# Patient Record
Sex: Female | Born: 1970 | Race: White | Hispanic: No | Marital: Married | Smoking: Never smoker
Health system: Southern US, Community
[De-identification: ages and names within clinical notes are randomized; demographics above are authoritative.]

## PROBLEM LIST (undated history)

## (undated) DIAGNOSIS — F319 Bipolar disorder, unspecified: Secondary | ICD-10-CM

## (undated) DIAGNOSIS — F99 Mental disorder, not otherwise specified: Secondary | ICD-10-CM

## (undated) DIAGNOSIS — I10 Essential (primary) hypertension: Secondary | ICD-10-CM

## (undated) DIAGNOSIS — G9340 Encephalopathy, unspecified: Secondary | ICD-10-CM

## (undated) DIAGNOSIS — F32A Depression, unspecified: Secondary | ICD-10-CM

## (undated) DIAGNOSIS — G935 Compression of brain: Secondary | ICD-10-CM

## (undated) DIAGNOSIS — F329 Major depressive disorder, single episode, unspecified: Secondary | ICD-10-CM

## (undated) DIAGNOSIS — F909 Attention-deficit hyperactivity disorder, unspecified type: Secondary | ICD-10-CM

## (undated) DIAGNOSIS — F410 Panic disorder [episodic paroxysmal anxiety] without agoraphobia: Secondary | ICD-10-CM

## (undated) DIAGNOSIS — F419 Anxiety disorder, unspecified: Secondary | ICD-10-CM

## (undated) DIAGNOSIS — K219 Gastro-esophageal reflux disease without esophagitis: Secondary | ICD-10-CM

## (undated) DIAGNOSIS — G43709 Chronic migraine without aura, not intractable, without status migrainosus: Secondary | ICD-10-CM

## (undated) DIAGNOSIS — E119 Type 2 diabetes mellitus without complications: Secondary | ICD-10-CM

## (undated) DIAGNOSIS — IMO0002 Reserved for concepts with insufficient information to code with codable children: Secondary | ICD-10-CM

## (undated) DIAGNOSIS — J45909 Unspecified asthma, uncomplicated: Secondary | ICD-10-CM

## (undated) DIAGNOSIS — J309 Allergic rhinitis, unspecified: Secondary | ICD-10-CM

## (undated) HISTORY — DX: Mental disorder, not otherwise specified: F99

## (undated) HISTORY — DX: Chronic migraine without aura, not intractable, without status migrainosus: G43.709

## (undated) HISTORY — DX: Depression, unspecified: F32.A

## (undated) HISTORY — DX: Compression of brain: G93.5

## (undated) HISTORY — DX: Major depressive disorder, single episode, unspecified: F32.9

## (undated) HISTORY — DX: Anxiety disorder, unspecified: F41.9

## (undated) HISTORY — DX: Panic disorder (episodic paroxysmal anxiety): F41.0

## (undated) HISTORY — DX: Allergic rhinitis, unspecified: J30.9

## (undated) HISTORY — PX: TUBAL LIGATION: SHX77

## (undated) HISTORY — PX: CHOLECYSTECTOMY: SHX55

## (undated) HISTORY — DX: Unspecified asthma, uncomplicated: J45.909

## (undated) HISTORY — DX: Reserved for concepts with insufficient information to code with codable children: IMO0002

## (undated) HISTORY — DX: Encephalopathy, unspecified: G93.40

## (undated) HISTORY — DX: Attention-deficit hyperactivity disorder, unspecified type: F90.9

## (undated) HISTORY — DX: Essential (primary) hypertension: I10

## (undated) HISTORY — DX: Gastro-esophageal reflux disease without esophagitis: K21.9

---

## 1997-02-24 ENCOUNTER — Ambulatory Visit (HOSPITAL_COMMUNITY): Admission: RE | Admit: 1997-02-24 | Discharge: 1997-02-24 | Payer: Self-pay | Admitting: Obstetrics and Gynecology

## 1997-03-09 ENCOUNTER — Inpatient Hospital Stay (HOSPITAL_COMMUNITY): Admission: AD | Admit: 1997-03-09 | Discharge: 1997-03-11 | Payer: Self-pay | Admitting: Obstetrics and Gynecology

## 1997-04-11 ENCOUNTER — Inpatient Hospital Stay (HOSPITAL_COMMUNITY): Admission: AD | Admit: 1997-04-11 | Discharge: 1997-04-11 | Payer: Self-pay | Admitting: Obstetrics and Gynecology

## 1997-04-19 ENCOUNTER — Inpatient Hospital Stay (HOSPITAL_COMMUNITY): Admission: AD | Admit: 1997-04-19 | Discharge: 1997-04-19 | Payer: Self-pay | Admitting: Obstetrics & Gynecology

## 1997-04-27 ENCOUNTER — Inpatient Hospital Stay (HOSPITAL_COMMUNITY): Admission: AD | Admit: 1997-04-27 | Discharge: 1997-04-29 | Payer: Self-pay | Admitting: Obstetrics & Gynecology

## 1997-06-01 ENCOUNTER — Other Ambulatory Visit: Admission: RE | Admit: 1997-06-01 | Discharge: 1997-06-01 | Payer: Self-pay | Admitting: Obstetrics & Gynecology

## 1997-10-23 ENCOUNTER — Ambulatory Visit (HOSPITAL_COMMUNITY): Admission: RE | Admit: 1997-10-23 | Discharge: 1997-10-23 | Payer: Self-pay | Admitting: Family Medicine

## 1998-08-06 ENCOUNTER — Other Ambulatory Visit: Admission: RE | Admit: 1998-08-06 | Discharge: 1998-08-06 | Payer: Self-pay | Admitting: Obstetrics & Gynecology

## 1999-02-14 ENCOUNTER — Emergency Department (HOSPITAL_COMMUNITY): Admission: EM | Admit: 1999-02-14 | Discharge: 1999-02-14 | Payer: Self-pay | Admitting: Internal Medicine

## 1999-02-14 ENCOUNTER — Encounter: Payer: Self-pay | Admitting: Internal Medicine

## 1999-10-18 ENCOUNTER — Other Ambulatory Visit: Admission: RE | Admit: 1999-10-18 | Discharge: 1999-10-18 | Payer: Self-pay | Admitting: Obstetrics & Gynecology

## 2000-03-14 ENCOUNTER — Ambulatory Visit (HOSPITAL_COMMUNITY): Admission: RE | Admit: 2000-03-14 | Discharge: 2000-03-14 | Payer: Self-pay | Admitting: Family Medicine

## 2000-05-19 ENCOUNTER — Observation Stay (HOSPITAL_COMMUNITY): Admission: AD | Admit: 2000-05-19 | Discharge: 2000-05-20 | Payer: Self-pay | Admitting: Obstetrics & Gynecology

## 2000-06-02 ENCOUNTER — Inpatient Hospital Stay (HOSPITAL_COMMUNITY): Admission: AD | Admit: 2000-06-02 | Discharge: 2000-06-05 | Payer: Self-pay | Admitting: Obstetrics and Gynecology

## 2002-12-15 ENCOUNTER — Other Ambulatory Visit: Admission: RE | Admit: 2002-12-15 | Discharge: 2002-12-15 | Payer: Self-pay | Admitting: Obstetrics & Gynecology

## 2003-06-29 ENCOUNTER — Ambulatory Visit (HOSPITAL_COMMUNITY): Admission: RE | Admit: 2003-06-29 | Discharge: 2003-06-29 | Payer: Self-pay | Admitting: Obstetrics & Gynecology

## 2003-06-30 ENCOUNTER — Ambulatory Visit (HOSPITAL_COMMUNITY): Admission: RE | Admit: 2003-06-30 | Discharge: 2003-06-30 | Payer: Self-pay | Admitting: Internal Medicine

## 2004-01-08 ENCOUNTER — Ambulatory Visit: Payer: Self-pay | Admitting: Family Medicine

## 2004-02-18 ENCOUNTER — Emergency Department (HOSPITAL_COMMUNITY): Admission: EM | Admit: 2004-02-18 | Discharge: 2004-02-18 | Payer: Self-pay | Admitting: Emergency Medicine

## 2004-03-24 ENCOUNTER — Emergency Department (HOSPITAL_COMMUNITY): Admission: EM | Admit: 2004-03-24 | Discharge: 2004-03-24 | Payer: Self-pay | Admitting: Emergency Medicine

## 2004-03-25 ENCOUNTER — Ambulatory Visit: Payer: Self-pay | Admitting: Family Medicine

## 2004-04-19 ENCOUNTER — Ambulatory Visit: Payer: Self-pay | Admitting: Family Medicine

## 2004-04-25 ENCOUNTER — Ambulatory Visit: Payer: Self-pay | Admitting: Family Medicine

## 2004-06-21 ENCOUNTER — Ambulatory Visit: Payer: Self-pay | Admitting: Family Medicine

## 2004-07-13 ENCOUNTER — Ambulatory Visit: Payer: Self-pay | Admitting: Family Medicine

## 2004-08-18 ENCOUNTER — Ambulatory Visit: Payer: Self-pay | Admitting: Family Medicine

## 2004-09-01 ENCOUNTER — Ambulatory Visit: Payer: Self-pay | Admitting: Family Medicine

## 2005-01-04 ENCOUNTER — Ambulatory Visit: Payer: Self-pay | Admitting: Family Medicine

## 2005-02-20 ENCOUNTER — Ambulatory Visit: Payer: Self-pay | Admitting: Family Medicine

## 2005-05-24 ENCOUNTER — Encounter: Payer: Self-pay | Admitting: Family Medicine

## 2011-07-31 DIAGNOSIS — L658 Other specified nonscarring hair loss: Secondary | ICD-10-CM

## 2011-07-31 DIAGNOSIS — L219 Seborrheic dermatitis, unspecified: Secondary | ICD-10-CM | POA: Insufficient documentation

## 2011-07-31 HISTORY — DX: Seborrheic dermatitis, unspecified: L21.9

## 2011-07-31 HISTORY — DX: Other specified nonscarring hair loss: L65.8

## 2011-12-01 ENCOUNTER — Institutional Professional Consult (permissible substitution): Payer: Self-pay | Admitting: Pulmonary Disease

## 2011-12-07 ENCOUNTER — Encounter: Payer: Self-pay | Admitting: Pulmonary Disease

## 2011-12-07 ENCOUNTER — Ambulatory Visit (INDEPENDENT_AMBULATORY_CARE_PROVIDER_SITE_OTHER): Payer: 59 | Admitting: Pulmonary Disease

## 2011-12-07 VITALS — BP 128/78 | HR 96 | Temp 98.0°F | Ht 66.0 in | Wt 175.4 lb

## 2011-12-07 DIAGNOSIS — R0683 Snoring: Secondary | ICD-10-CM

## 2011-12-07 DIAGNOSIS — G4710 Hypersomnia, unspecified: Secondary | ICD-10-CM

## 2011-12-07 HISTORY — DX: Snoring: R06.83

## 2011-12-07 NOTE — Addendum Note (Signed)
Addended by: Tommie Sams on: 12/07/2011 11:37 AM   Modules accepted: Orders

## 2011-12-07 NOTE — Patient Instructions (Signed)
Will arrange for sleep study Will call to arrange for follow up after sleep study reviewed 

## 2011-12-07 NOTE — Progress Notes (Deleted)
  Subjective:    Patient ID: Providence Lanius, female    DOB: 1970/07/30, 41 y.o.   MRN: [id number]  HPI    Review of Systems  Constitutional: Positive for appetite change and unexpected weight change.  HENT: Positive for ear pain, congestion, sneezing and trouble swallowing. Negative for sore throat and dental problem.   Respiratory: Positive for cough and shortness of breath.   Cardiovascular: Positive for chest pain, palpitations and leg swelling.  Gastrointestinal: Negative for abdominal pain.  Musculoskeletal: Negative for joint swelling.  Skin: Negative for rash.  Neurological: Positive for headaches.  Psychiatric/Behavioral: Positive for dysphoric mood. The patient is nervous/anxious.        Objective:   Physical Exam        Assessment & Plan:

## 2011-12-07 NOTE — Assessment & Plan Note (Addendum)
She reports snoring, sleep disruption, and daytime sleepiness.  She has history of asthma, hypertension, chronic migraines, Chiari 1 malformation, and depression.  I am concerned she could have sleep apnea.  I have explained how sleep apnea can affect the patient's health.  Driving precautions and importance of weight loss were discussed.  Treatment options for sleep apnea were reviewed.  To further assess will arrange for in lab sleep study.

## 2011-12-07 NOTE — Progress Notes (Signed)
Chief Complaint  Patient presents with  . Advice Only    trouble staying asleep, snoring is loud/worse, feeling very tired during the day. does not have trouble falling asleep. wakes up about 5-7 times per night. some nights she is up for several hours before she can go back to sleep   CC: Meredith Cherry  History of Present Illness: Meredith Cherry is a 41 y.o. female for evaluation of sleep troubles.  She is followed by Dr. Neale Burly for headaches.  There was concern about sleep problems contributing to her headaches.  As a result she was referred to pulmonary/sleep medicine.  She snores, and wakes up feeling like she can't breath.  Her snoring is getting louder, and she is waking up more frequently.  She goes to bed at 12 midnight.  She falls asleep quickly.  She wakes up after about an hour.  She can usually go back to sleep, but sometimes can take up to a few hours to fall back to sleep.  She gets out of bed at 640 am to get her kids ready for school.  She will then go back to sleep for another hour.  She feels okay in the morning, but still gets headaches.  She will feel sleepy as the day goes on, and can fall asleep if sitting quiet.  She is not using anything to help her sleep or stay awake.  She does talk in her sleep.  She wears a mouth guard for bruxism.  She gets frequent allergy and sinus problems.  Her Epworth score is 4 out of 24.  The patient denies sleep walking, or nightmares.  There is no history of restless legs.  The patient denies sleep hallucinations, sleep paralysis, or cataplexy.  Tests:  Past Medical History  Diagnosis Date  . Chronic migraine   . ADHD (attention deficit hyperactivity disorder)   . Anxiety   . Asthma   . Depression   . GERD (gastroesophageal reflux disease)   . Hypertension   . Psychiatric disorder   . Allergic rhinitis   . Panic attack   . Chiari malformation type I     Past Surgical History  Procedure Date  . Cholecystectomy   .  Tubal ligation     Current Outpatient Prescriptions on File Prior to Visit  Medication Sig Dispense Refill  . albuterol (PROVENTIL) (2.5 MG/3ML) 0.083% nebulizer solution Take 2.5 mg by nebulization every 6 (six) hours as needed.      Marland Kitchen amLODipine (NORVASC) 10 MG tablet Take 10 mg by mouth daily.      Marland Kitchen buPROPion (WELLBUTRIN SR) 150 MG 12 hr tablet Take 150 mg by mouth 2 (two) times daily.      . cloNIDine (CATAPRES) 0.1 MG tablet Take 0.1 mg by mouth 2 (two) times daily.      Marland Kitchen diltiazem (CARDIZEM) 120 MG tablet Take 120 mg by mouth 2 (two) times daily.      . hydrochlorothiazide (HYDRODIURIL) 25 MG tablet Take 25 mg by mouth daily.      . mometasone-formoterol (DULERA) 100-5 MCG/ACT AERO Inhale 2 puffs into the lungs as needed.      . ranitidine (ZANTAC) 300 MG tablet Take 300 mg by mouth at bedtime.      . topiramate (TOPAMAX) 100 MG tablet Take 100 mg by mouth daily.        Allergies  Allergen Reactions  . Bactrim (Sulfamethoxazole W-Trimethoprim)   . Penicillins   . Sulfa Antibiotics  Family History  Problem Relation Age of Onset  . Stroke Mother     x 5  . Hypertension Mother   . Heart disease Mother   . Diabetes Mother   . Diabetes Father   . Heart disease Maternal Grandmother   . Breast cancer Paternal Grandmother   . Asthma Brother     History  Substance Use Topics  . Smoking status: Never Smoker   . Smokeless tobacco: Not on file  . Alcohol Use: No    Review of Systems  Constitutional: Positive for appetite change and unexpected weight change.  HENT: Positive for ear pain, congestion, sneezing and trouble swallowing. Negative for sore throat and dental problem.   Respiratory: Positive for cough and shortness of breath.   Cardiovascular: Positive for chest pain, palpitations and leg swelling.  Gastrointestinal: Negative for abdominal pain.  Musculoskeletal: Negative for joint swelling.  Skin: Negative for rash.  Neurological: Positive for headaches.    Psychiatric/Behavioral: Positive for dysphoric mood. The patient is nervous/anxious.    Physical Exam: Filed Vitals:   12/07/11 0944  BP: 128/78  Pulse: 96  Temp: 98 F (36.7 C)  TempSrc: Oral  Height: 5\' 6"  (1.676 m)  Weight: 175 lb 6.4 oz (79.561 kg)  SpO2: 99%  ,  Current Encounter SPO2  12/07/11 0944 99%    Wt Readings from Last 3 Encounters:  12/07/11 175 lb 6.4 oz (79.561 kg)    Body mass index is 28.31 kg/(m^2).   General - No distress ENT - No sinus tenderness, narrow nasal angles, MP3, 2+ tonsil, enlarged tongue no oral exudate, no LAN, no thyromegaly, TM clear, pupils equal/reactive Cardiac - s1s2 regular, no murmur, pulses symmetric Chest - No wheeze/rales/dullness, good air entry, normal respiratory excursion Back - No focal tenderness Abd - Soft, non-tender, no organomegaly, + bowel sounds Ext - No edema Neuro - Normal strength, cranial nerves intact Skin - No rashes Psych - Normal mood, and behavior.   Assessment/Plan:  Coralyn Helling, MD [facility]/Sleep Pager:  [phone] 12/07/2011, 9:59 AM

## 2012-01-12 ENCOUNTER — Ambulatory Visit (HOSPITAL_BASED_OUTPATIENT_CLINIC_OR_DEPARTMENT_OTHER): Payer: 59 | Attending: Pulmonary Disease | Admitting: Sleep Medicine

## 2012-01-12 VITALS — Ht 66.0 in | Wt 169.0 lb

## 2012-01-12 DIAGNOSIS — G4710 Hypersomnia, unspecified: Secondary | ICD-10-CM

## 2012-02-05 ENCOUNTER — Telehealth: Payer: Self-pay | Admitting: Pulmonary Disease

## 2012-02-05 ENCOUNTER — Encounter: Payer: Self-pay | Admitting: Pulmonary Disease

## 2012-02-05 ENCOUNTER — Ambulatory Visit (INDEPENDENT_AMBULATORY_CARE_PROVIDER_SITE_OTHER): Payer: 59 | Admitting: Pulmonary Disease

## 2012-02-05 VITALS — BP 160/102 | HR 125 | Temp 97.7°F | Ht 66.0 in | Wt 177.0 lb

## 2012-02-05 DIAGNOSIS — G4710 Hypersomnia, unspecified: Secondary | ICD-10-CM

## 2012-02-05 DIAGNOSIS — R0609 Other forms of dyspnea: Secondary | ICD-10-CM

## 2012-02-05 DIAGNOSIS — R0683 Snoring: Secondary | ICD-10-CM

## 2012-02-05 NOTE — Telephone Encounter (Signed)
I spoke with pt. Scheduled to see VS 02/06/12 at 11:3o to discuss results

## 2012-02-05 NOTE — Patient Instructions (Signed)
Call if you find a mouth piece you like to help with your snoring Follow up as needed

## 2012-02-05 NOTE — Telephone Encounter (Signed)
PSG 01/12/12 >> AHI 0, SpO2 88%, PLMI 0.  Decreased R, no S.  Will have my nurse schedule ROV to review results.

## 2012-02-05 NOTE — Progress Notes (Signed)
Chief Complaint  Patient presents with  . Results    pt here to discuss sleep study results.     History of Present Illness: Meredith Cherry is a 42 y.o. female with sleep troubles, and snoring.  She is here to review her sleep study.  She is very concerned about how her snoring is impacting her daughters sleep.  She is under a lot of stress related to her daughters mental health problems.  TESTS: PSG 01/12/12 >> AHI 0, SpO2 88%, PLMI 0. Decreased R, no S.  Past Medical History  Diagnosis Date  . Chronic migraine   . ADHD (attention deficit hyperactivity disorder)   . Anxiety   . Asthma   . Depression   . GERD (gastroesophageal reflux disease)   . Hypertension   . Psychiatric disorder   . Allergic rhinitis   . Panic attack   . Chiari malformation type I     Past Surgical History  Procedure Date  . Cholecystectomy   . Tubal ligation     Outpatient Encounter Prescriptions as of 02/05/2012  Medication Sig Dispense Refill  . albuterol (PROVENTIL) (2.5 MG/3ML) 0.083% nebulizer solution Take 2.5 mg by nebulization every 6 (six) hours as needed.      Marland Kitchen amLODipine (NORVASC) 10 MG tablet Take 10 mg by mouth daily.      Marland Kitchen buPROPion (WELLBUTRIN XL) 300 MG 24 hr tablet Take 300 mg by mouth daily.      . cloNIDine (CATAPRES) 0.1 MG tablet Take 0.1 mg by mouth 2 (two) times daily.      Marland Kitchen diltiazem (CARDIZEM) 120 MG tablet Take 120 mg by mouth 2 (two) times daily.      . hydrochlorothiazide (HYDRODIURIL) 25 MG tablet Take 25 mg by mouth daily.      . mometasone-formoterol (DULERA) 100-5 MCG/ACT AERO Inhale 2 puffs into the lungs as needed.      . topiramate (TOPAMAX) 100 MG tablet Take 100 mg by mouth daily.      . [DISCONTINUED] buPROPion (WELLBUTRIN SR) 150 MG 12 hr tablet Take 150 mg by mouth 2 (two) times daily.      . [DISCONTINUED] busPIRone (BUSPAR) 10 MG tablet Take 10 mg by mouth 2 (two) times daily.      . [DISCONTINUED] ranitidine (ZANTAC) 300 MG tablet Take 300 mg by mouth  at bedtime.        Allergies  Allergen Reactions  . Bactrim (Sulfamethoxazole W-Trimethoprim)   . Penicillins   . Sulfa Antibiotics     Physical Exam:  Filed Vitals:   02/05/12 1511 02/05/12 1512  BP:  160/102  Pulse:  125  Temp: 97.7 F (36.5 C)   TempSrc: Oral   Height: 5\' 6"  (1.676 m)   Weight: 177 lb (80.287 kg)   SpO2:  100%     Current Encounter SPO2  02/05/12 1512 100%  12/07/11 0944 99%     Body mass index is 28.57 kg/(m^2).   Wt Readings from Last 2 Encounters:  02/05/12 177 lb (80.287 kg)  01/12/12 169 lb (76.658 kg)     General - No distress ENT - No sinus tenderness, no oral exudate, no LAN Cardiac - s1s2 regular, no murmur Chest - No wheeze/rales/dullness Back - No focal tenderness Abd - Soft, non-tender Ext - No edema Neuro - Normal strength Skin - No rashes Psych - normal mood, and behavior   Assessment/Plan:  Coralyn Helling, MD [facility]/Sleep Pager:  [phone] 02/05/2012, 3:26 PM

## 2012-02-05 NOTE — Assessment & Plan Note (Signed)
He sleep study did not show evidence for sleep apnea.  I reviewed several options to help with her snoring, including oral appliance and surgical intervention.  Explained that insurance coverage for any intervention will be difficult to arrange since she does not have sleep apnea.  She will look into different options, and call our office if further assistance is needed.

## 2012-02-05 NOTE — Telephone Encounter (Signed)
Pt had sleep study 01/12/12. Please advise VS thanks

## 2012-02-06 ENCOUNTER — Ambulatory Visit: Payer: 59 | Admitting: Pulmonary Disease

## 2012-02-06 NOTE — Procedures (Signed)
Meredith Cherry, KYSAR NO.:  [id number]  MEDICAL RECORD NO.:  [id number]          PATIENT TYPE:  OUT  LOCATION:  SLEEP CENTER                 FACILITY:  [facility]  PHYSICIAN:  Coralyn Helling, MD        DATE OF BIRTH:  07-09-1970  DATE OF STUDY:  01/12/2012                           NOCTURNAL POLYSOMNOGRAM  REFERRING PHYSICIAN:  Coralyn Helling, MD  INDICATION:  [name] is a 42 year old female who has a history of hypertension, chronic migraine, asthma, depression, and Chiari type 1 malformation.  She also reports snoring, sleep disruption, and daytime sleepiness.  She is referred to the sleep lab for evaluation of hypersomnia with obstructive sleep apnea.  Height is 5 feet 6 inches, weight is 169 pounds, BMI is 27.3, neck size is 14 inches.  MEDICATIONS:  Wellbutrin, BuSpar, clonidine, amlodipine, diltiazem, hydrochlorothiazide, and Topamax.  EPWORTH SCORE:  9.  SLEEP ARCHITECTURE:  Total recording time was 389 minutes.  Total sleep time was 167 minutes.  Sleep efficiency was 0.3%, sleep latency was 91 minutes, REM latency was 288 minutes.  The patient was observed in all stages of sleep and slept exclusively in the non-supine position.  RESPIRATORY DATA:  The average respiratory rate was 20.  Mild snoring was noted by the technician.  The overall apnea/hypopnea index was 0.  CARDIAC DATA:  The average heart rate was 94 and the rhythm strip showed sinus rhythm.  OXYGEN DATA:  The baseline oxygenation was 99%.  The oxygen saturation nadir was 88%.  The study was conducted without the use of supplemental oxygen.  MOVEMENT/PARASOMNIA:  The periodic limb movement index was 0.  The patient had 1 restroom trip.  The patient was noted to be very upset on arrival to the sleep lab apparently due to family difficulties.  She had also complained of indigestion.  IMPRESSION:  This study did not show evidence for sleep-disordered breathing or periodic limb movements.  Of  note is that she had reduced total sleep time and did not sleep in the supine position.  Therefore, this may underestimate the presence of sleep-disordered breathing.  Clinical correlation would be necessary to determine if the patient would require a repeat sleep study with the use of a sleep aid. Additional evaluation of her complaints of hypersomnia could include a multiple sleep latency test and/or a maintenance of wakefulness test. Clinical correlation would be necessary to determine appropriate testing. In addition, she may benefit from adjustments in her psychotropic medications as these may be contributing to her symptoms of daytime sleepiness as well.     Coralyn Helling, MD Diplomat, American Board of Sleep Medicine    VS/MEDQ  D:  02/05/2012 12:29:28  T:  02/06/2012 01:27:45  Job:  [id number]

## 2012-09-03 DIAGNOSIS — J40 Bronchitis, not specified as acute or chronic: Secondary | ICD-10-CM

## 2012-09-03 DIAGNOSIS — I499 Cardiac arrhythmia, unspecified: Secondary | ICD-10-CM | POA: Insufficient documentation

## 2012-09-03 DIAGNOSIS — G43009 Migraine without aura, not intractable, without status migrainosus: Secondary | ICD-10-CM

## 2012-09-03 DIAGNOSIS — M4716 Other spondylosis with myelopathy, lumbar region: Secondary | ICD-10-CM

## 2012-09-03 HISTORY — DX: Cardiac arrhythmia, unspecified: I49.9

## 2012-09-03 HISTORY — DX: Other spondylosis with myelopathy, lumbar region: M47.16

## 2012-09-03 HISTORY — DX: Bronchitis, not specified as acute or chronic: J40

## 2012-09-03 HISTORY — DX: Migraine without aura, not intractable, without status migrainosus: G43.009

## 2012-09-16 DIAGNOSIS — R519 Headache, unspecified: Secondary | ICD-10-CM

## 2012-09-16 HISTORY — DX: Headache, unspecified: R51.9

## 2012-11-22 DIAGNOSIS — Z8619 Personal history of other infectious and parasitic diseases: Secondary | ICD-10-CM | POA: Insufficient documentation

## 2012-11-22 DIAGNOSIS — J45909 Unspecified asthma, uncomplicated: Secondary | ICD-10-CM | POA: Insufficient documentation

## 2012-11-22 DIAGNOSIS — I10 Essential (primary) hypertension: Secondary | ICD-10-CM | POA: Insufficient documentation

## 2012-11-22 DIAGNOSIS — Z8614 Personal history of Methicillin resistant Staphylococcus aureus infection: Secondary | ICD-10-CM

## 2012-11-22 DIAGNOSIS — K219 Gastro-esophageal reflux disease without esophagitis: Secondary | ICD-10-CM

## 2012-11-22 DIAGNOSIS — R55 Syncope and collapse: Secondary | ICD-10-CM

## 2012-11-22 HISTORY — DX: Essential (primary) hypertension: I10

## 2012-11-22 HISTORY — DX: Personal history of Methicillin resistant Staphylococcus aureus infection: Z86.14

## 2012-11-22 HISTORY — DX: Syncope and collapse: R55

## 2012-11-22 HISTORY — DX: Personal history of other infectious and parasitic diseases: Z86.19

## 2012-11-22 HISTORY — DX: Gastro-esophageal reflux disease without esophagitis: K21.9

## 2013-04-12 ENCOUNTER — Inpatient Hospital Stay: Payer: Self-pay | Admitting: Psychiatry

## 2013-12-19 DIAGNOSIS — R002 Palpitations: Secondary | ICD-10-CM | POA: Insufficient documentation

## 2014-01-26 DIAGNOSIS — J069 Acute upper respiratory infection, unspecified: Secondary | ICD-10-CM | POA: Diagnosis not present

## 2014-01-28 DIAGNOSIS — R079 Chest pain, unspecified: Secondary | ICD-10-CM | POA: Diagnosis not present

## 2014-01-28 DIAGNOSIS — E119 Type 2 diabetes mellitus without complications: Secondary | ICD-10-CM | POA: Diagnosis not present

## 2014-01-28 DIAGNOSIS — R1110 Vomiting, unspecified: Secondary | ICD-10-CM | POA: Diagnosis not present

## 2014-01-28 DIAGNOSIS — J069 Acute upper respiratory infection, unspecified: Secondary | ICD-10-CM | POA: Diagnosis not present

## 2014-01-28 DIAGNOSIS — I10 Essential (primary) hypertension: Secondary | ICD-10-CM | POA: Diagnosis not present

## 2014-01-28 DIAGNOSIS — R000 Tachycardia, unspecified: Secondary | ICD-10-CM | POA: Diagnosis not present

## 2014-01-28 DIAGNOSIS — J45909 Unspecified asthma, uncomplicated: Secondary | ICD-10-CM | POA: Diagnosis not present

## 2014-01-28 DIAGNOSIS — Z7982 Long term (current) use of aspirin: Secondary | ICD-10-CM | POA: Diagnosis not present

## 2014-02-02 DIAGNOSIS — R1033 Periumbilical pain: Secondary | ICD-10-CM | POA: Diagnosis not present

## 2014-02-02 DIAGNOSIS — R112 Nausea with vomiting, unspecified: Secondary | ICD-10-CM | POA: Diagnosis not present

## 2014-02-02 DIAGNOSIS — I10 Essential (primary) hypertension: Secondary | ICD-10-CM | POA: Diagnosis not present

## 2014-02-02 DIAGNOSIS — F419 Anxiety disorder, unspecified: Secondary | ICD-10-CM | POA: Diagnosis not present

## 2014-02-02 DIAGNOSIS — E119 Type 2 diabetes mellitus without complications: Secondary | ICD-10-CM | POA: Diagnosis not present

## 2014-02-02 DIAGNOSIS — R339 Retention of urine, unspecified: Secondary | ICD-10-CM | POA: Diagnosis not present

## 2014-02-02 DIAGNOSIS — J0100 Acute maxillary sinusitis, unspecified: Secondary | ICD-10-CM | POA: Diagnosis not present

## 2014-02-02 DIAGNOSIS — J45909 Unspecified asthma, uncomplicated: Secondary | ICD-10-CM | POA: Diagnosis not present

## 2014-02-02 DIAGNOSIS — R509 Fever, unspecified: Secondary | ICD-10-CM | POA: Diagnosis not present

## 2014-02-02 DIAGNOSIS — R109 Unspecified abdominal pain: Secondary | ICD-10-CM | POA: Diagnosis not present

## 2014-02-02 DIAGNOSIS — F329 Major depressive disorder, single episode, unspecified: Secondary | ICD-10-CM | POA: Diagnosis not present

## 2014-02-02 DIAGNOSIS — Z7982 Long term (current) use of aspirin: Secondary | ICD-10-CM | POA: Diagnosis not present

## 2014-02-03 DIAGNOSIS — R112 Nausea with vomiting, unspecified: Secondary | ICD-10-CM | POA: Diagnosis not present

## 2014-02-03 DIAGNOSIS — E119 Type 2 diabetes mellitus without complications: Secondary | ICD-10-CM | POA: Diagnosis not present

## 2014-02-03 DIAGNOSIS — F419 Anxiety disorder, unspecified: Secondary | ICD-10-CM | POA: Diagnosis not present

## 2014-02-03 DIAGNOSIS — R509 Fever, unspecified: Secondary | ICD-10-CM | POA: Diagnosis not present

## 2014-02-03 DIAGNOSIS — I10 Essential (primary) hypertension: Secondary | ICD-10-CM | POA: Diagnosis not present

## 2014-02-03 DIAGNOSIS — R339 Retention of urine, unspecified: Secondary | ICD-10-CM | POA: Diagnosis not present

## 2014-02-03 DIAGNOSIS — R262 Difficulty in walking, not elsewhere classified: Secondary | ICD-10-CM | POA: Diagnosis not present

## 2014-02-03 DIAGNOSIS — Z7982 Long term (current) use of aspirin: Secondary | ICD-10-CM | POA: Diagnosis not present

## 2014-02-03 DIAGNOSIS — F329 Major depressive disorder, single episode, unspecified: Secondary | ICD-10-CM | POA: Diagnosis not present

## 2014-02-03 DIAGNOSIS — M25661 Stiffness of right knee, not elsewhere classified: Secondary | ICD-10-CM | POA: Diagnosis not present

## 2014-02-03 DIAGNOSIS — M25561 Pain in right knee: Secondary | ICD-10-CM | POA: Diagnosis not present

## 2014-02-03 DIAGNOSIS — J45909 Unspecified asthma, uncomplicated: Secondary | ICD-10-CM | POA: Diagnosis not present

## 2014-02-03 DIAGNOSIS — R109 Unspecified abdominal pain: Secondary | ICD-10-CM | POA: Diagnosis not present

## 2014-02-05 DIAGNOSIS — N319 Neuromuscular dysfunction of bladder, unspecified: Secondary | ICD-10-CM | POA: Diagnosis not present

## 2014-02-05 DIAGNOSIS — R339 Retention of urine, unspecified: Secondary | ICD-10-CM | POA: Diagnosis not present

## 2014-02-05 DIAGNOSIS — R3912 Poor urinary stream: Secondary | ICD-10-CM | POA: Diagnosis not present

## 2014-02-05 DIAGNOSIS — N3090 Cystitis, unspecified without hematuria: Secondary | ICD-10-CM | POA: Diagnosis not present

## 2014-02-05 DIAGNOSIS — N312 Flaccid neuropathic bladder, not elsewhere classified: Secondary | ICD-10-CM | POA: Diagnosis not present

## 2014-02-12 DIAGNOSIS — R339 Retention of urine, unspecified: Secondary | ICD-10-CM | POA: Diagnosis not present

## 2014-02-20 DIAGNOSIS — I10 Essential (primary) hypertension: Secondary | ICD-10-CM | POA: Diagnosis not present

## 2014-02-21 DIAGNOSIS — E119 Type 2 diabetes mellitus without complications: Secondary | ICD-10-CM | POA: Diagnosis not present

## 2014-02-21 DIAGNOSIS — Z7982 Long term (current) use of aspirin: Secondary | ICD-10-CM | POA: Diagnosis not present

## 2014-02-21 DIAGNOSIS — I4891 Unspecified atrial fibrillation: Secondary | ICD-10-CM | POA: Diagnosis not present

## 2014-02-21 DIAGNOSIS — F419 Anxiety disorder, unspecified: Secondary | ICD-10-CM | POA: Diagnosis not present

## 2014-02-21 DIAGNOSIS — I10 Essential (primary) hypertension: Secondary | ICD-10-CM | POA: Diagnosis not present

## 2014-02-24 DIAGNOSIS — A09 Infectious gastroenteritis and colitis, unspecified: Secondary | ICD-10-CM | POA: Diagnosis not present

## 2014-03-02 DIAGNOSIS — F331 Major depressive disorder, recurrent, moderate: Secondary | ICD-10-CM | POA: Diagnosis not present

## 2014-03-11 DIAGNOSIS — R339 Retention of urine, unspecified: Secondary | ICD-10-CM | POA: Diagnosis not present

## 2014-03-12 DIAGNOSIS — J209 Acute bronchitis, unspecified: Secondary | ICD-10-CM | POA: Diagnosis not present

## 2014-03-23 DIAGNOSIS — J4521 Mild intermittent asthma with (acute) exacerbation: Secondary | ICD-10-CM | POA: Diagnosis not present

## 2014-03-23 DIAGNOSIS — I10 Essential (primary) hypertension: Secondary | ICD-10-CM | POA: Diagnosis not present

## 2014-03-23 DIAGNOSIS — R197 Diarrhea, unspecified: Secondary | ICD-10-CM | POA: Diagnosis not present

## 2014-03-23 DIAGNOSIS — E119 Type 2 diabetes mellitus without complications: Secondary | ICD-10-CM | POA: Diagnosis not present

## 2014-03-23 DIAGNOSIS — K2970 Gastritis, unspecified, without bleeding: Secondary | ICD-10-CM | POA: Diagnosis not present

## 2014-03-23 DIAGNOSIS — R079 Chest pain, unspecified: Secondary | ICD-10-CM | POA: Diagnosis not present

## 2014-03-23 DIAGNOSIS — Z7982 Long term (current) use of aspirin: Secondary | ICD-10-CM | POA: Diagnosis not present

## 2014-03-23 DIAGNOSIS — R1110 Vomiting, unspecified: Secondary | ICD-10-CM | POA: Diagnosis not present

## 2014-03-23 DIAGNOSIS — K529 Noninfective gastroenteritis and colitis, unspecified: Secondary | ICD-10-CM | POA: Diagnosis not present

## 2014-03-23 DIAGNOSIS — N838 Other noninflammatory disorders of ovary, fallopian tube and broad ligament: Secondary | ICD-10-CM | POA: Diagnosis not present

## 2014-03-23 DIAGNOSIS — J209 Acute bronchitis, unspecified: Secondary | ICD-10-CM | POA: Diagnosis not present

## 2014-03-23 DIAGNOSIS — E860 Dehydration: Secondary | ICD-10-CM | POA: Diagnosis not present

## 2014-03-23 DIAGNOSIS — R1011 Right upper quadrant pain: Secondary | ICD-10-CM | POA: Diagnosis not present

## 2014-03-23 DIAGNOSIS — J45909 Unspecified asthma, uncomplicated: Secondary | ICD-10-CM | POA: Diagnosis not present

## 2014-03-23 DIAGNOSIS — R112 Nausea with vomiting, unspecified: Secondary | ICD-10-CM | POA: Diagnosis not present

## 2014-03-23 DIAGNOSIS — K5730 Diverticulosis of large intestine without perforation or abscess without bleeding: Secondary | ICD-10-CM | POA: Diagnosis not present

## 2014-03-23 DIAGNOSIS — I4891 Unspecified atrial fibrillation: Secondary | ICD-10-CM | POA: Diagnosis not present

## 2014-03-27 DIAGNOSIS — R079 Chest pain, unspecified: Secondary | ICD-10-CM | POA: Diagnosis not present

## 2014-03-27 DIAGNOSIS — Z7982 Long term (current) use of aspirin: Secondary | ICD-10-CM | POA: Diagnosis not present

## 2014-03-27 DIAGNOSIS — I10 Essential (primary) hypertension: Secondary | ICD-10-CM | POA: Diagnosis not present

## 2014-03-27 DIAGNOSIS — J45909 Unspecified asthma, uncomplicated: Secondary | ICD-10-CM | POA: Diagnosis not present

## 2014-03-27 DIAGNOSIS — E119 Type 2 diabetes mellitus without complications: Secondary | ICD-10-CM | POA: Diagnosis not present

## 2014-03-27 DIAGNOSIS — R112 Nausea with vomiting, unspecified: Secondary | ICD-10-CM | POA: Diagnosis not present

## 2014-03-27 DIAGNOSIS — J90 Pleural effusion, not elsewhere classified: Secondary | ICD-10-CM | POA: Diagnosis not present

## 2014-03-27 DIAGNOSIS — I4891 Unspecified atrial fibrillation: Secondary | ICD-10-CM | POA: Diagnosis not present

## 2014-03-27 DIAGNOSIS — R55 Syncope and collapse: Secondary | ICD-10-CM | POA: Diagnosis not present

## 2014-04-03 DIAGNOSIS — Z1231 Encounter for screening mammogram for malignant neoplasm of breast: Secondary | ICD-10-CM | POA: Diagnosis not present

## 2014-04-16 DIAGNOSIS — R339 Retention of urine, unspecified: Secondary | ICD-10-CM | POA: Diagnosis not present
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
## 2014-05-20 DIAGNOSIS — F321 Major depressive disorder, single episode, moderate: Secondary | ICD-10-CM | POA: Diagnosis not present

## 2014-05-21 DIAGNOSIS — R0789 Other chest pain: Secondary | ICD-10-CM | POA: Diagnosis not present

## 2014-05-21 DIAGNOSIS — R0602 Shortness of breath: Secondary | ICD-10-CM | POA: Diagnosis not present

## 2014-05-21 DIAGNOSIS — J309 Allergic rhinitis, unspecified: Secondary | ICD-10-CM | POA: Diagnosis not present

## 2014-05-21 DIAGNOSIS — R079 Chest pain, unspecified: Secondary | ICD-10-CM | POA: Diagnosis not present

## 2014-05-21 DIAGNOSIS — R112 Nausea with vomiting, unspecified: Secondary | ICD-10-CM | POA: Diagnosis not present

## 2014-06-15 DIAGNOSIS — R339 Retention of urine, unspecified: Secondary | ICD-10-CM | POA: Diagnosis not present

## 2014-06-17 DIAGNOSIS — F320 Major depressive disorder, single episode, mild: Secondary | ICD-10-CM | POA: Diagnosis not present

## 2014-07-22 DIAGNOSIS — F321 Major depressive disorder, single episode, moderate: Secondary | ICD-10-CM | POA: Diagnosis not present

## 2014-07-22 DIAGNOSIS — Z79899 Other long term (current) drug therapy: Secondary | ICD-10-CM | POA: Diagnosis not present

## 2014-08-03 DIAGNOSIS — I10 Essential (primary) hypertension: Secondary | ICD-10-CM | POA: Diagnosis not present

## 2014-08-03 DIAGNOSIS — E119 Type 2 diabetes mellitus without complications: Secondary | ICD-10-CM | POA: Diagnosis not present

## 2014-08-03 DIAGNOSIS — H5203 Hypermetropia, bilateral: Secondary | ICD-10-CM | POA: Diagnosis not present

## 2014-08-07 DIAGNOSIS — S62609A Fracture of unspecified phalanx of unspecified finger, initial encounter for closed fracture: Secondary | ICD-10-CM | POA: Diagnosis not present

## 2014-08-10 DIAGNOSIS — S63639A Sprain of interphalangeal joint of unspecified finger, initial encounter: Secondary | ICD-10-CM | POA: Diagnosis not present

## 2014-08-27 DIAGNOSIS — Z0100 Encounter for examination of eyes and vision without abnormal findings: Secondary | ICD-10-CM | POA: Diagnosis not present

## 2014-09-08 DIAGNOSIS — K591 Functional diarrhea: Secondary | ICD-10-CM | POA: Diagnosis not present

## 2014-09-08 DIAGNOSIS — R197 Diarrhea, unspecified: Secondary | ICD-10-CM | POA: Diagnosis not present

## 2014-10-16 DIAGNOSIS — J45909 Unspecified asthma, uncomplicated: Secondary | ICD-10-CM | POA: Diagnosis not present

## 2014-10-16 DIAGNOSIS — E119 Type 2 diabetes mellitus without complications: Secondary | ICD-10-CM | POA: Diagnosis not present

## 2014-10-16 DIAGNOSIS — Z79899 Other long term (current) drug therapy: Secondary | ICD-10-CM | POA: Diagnosis not present

## 2014-10-16 DIAGNOSIS — R1033 Periumbilical pain: Secondary | ICD-10-CM | POA: Diagnosis not present

## 2014-10-16 DIAGNOSIS — I10 Essential (primary) hypertension: Secondary | ICD-10-CM | POA: Diagnosis not present

## 2014-10-16 DIAGNOSIS — R197 Diarrhea, unspecified: Secondary | ICD-10-CM | POA: Diagnosis not present

## 2014-10-16 DIAGNOSIS — I4891 Unspecified atrial fibrillation: Secondary | ICD-10-CM | POA: Diagnosis not present

## 2014-10-16 DIAGNOSIS — K625 Hemorrhage of anus and rectum: Secondary | ICD-10-CM | POA: Diagnosis not present

## 2014-10-16 DIAGNOSIS — K219 Gastro-esophageal reflux disease without esophagitis: Secondary | ICD-10-CM | POA: Diagnosis not present

## 2014-10-16 DIAGNOSIS — R1031 Right lower quadrant pain: Secondary | ICD-10-CM | POA: Diagnosis not present

## 2014-10-27 DIAGNOSIS — E1165 Type 2 diabetes mellitus with hyperglycemia: Secondary | ICD-10-CM | POA: Diagnosis not present

## 2014-11-06 DIAGNOSIS — Z23 Encounter for immunization: Secondary | ICD-10-CM | POA: Diagnosis not present

## 2014-12-10 DIAGNOSIS — J019 Acute sinusitis, unspecified: Secondary | ICD-10-CM | POA: Diagnosis not present

## 2015-01-18 DIAGNOSIS — J0100 Acute maxillary sinusitis, unspecified: Secondary | ICD-10-CM | POA: Diagnosis not present

## 2015-02-17 DIAGNOSIS — Z1389 Encounter for screening for other disorder: Secondary | ICD-10-CM | POA: Diagnosis not present

## 2015-02-17 DIAGNOSIS — E782 Mixed hyperlipidemia: Secondary | ICD-10-CM | POA: Diagnosis not present

## 2015-02-17 DIAGNOSIS — Z79899 Other long term (current) drug therapy: Secondary | ICD-10-CM | POA: Diagnosis not present

## 2015-02-17 DIAGNOSIS — E1165 Type 2 diabetes mellitus with hyperglycemia: Secondary | ICD-10-CM | POA: Diagnosis not present

## 2015-02-17 DIAGNOSIS — I10 Essential (primary) hypertension: Secondary | ICD-10-CM | POA: Diagnosis not present

## 2015-02-25 DIAGNOSIS — R51 Headache: Secondary | ICD-10-CM | POA: Diagnosis not present

## 2015-02-25 DIAGNOSIS — S60032A Contusion of left middle finger without damage to nail, initial encounter: Secondary | ICD-10-CM | POA: Diagnosis not present

## 2015-03-22 DIAGNOSIS — N10 Acute tubulo-interstitial nephritis: Secondary | ICD-10-CM | POA: Diagnosis not present

## 2015-03-23 DIAGNOSIS — R197 Diarrhea, unspecified: Secondary | ICD-10-CM | POA: Diagnosis not present

## 2015-03-23 DIAGNOSIS — N3000 Acute cystitis without hematuria: Secondary | ICD-10-CM | POA: Diagnosis not present

## 2015-03-23 DIAGNOSIS — R1313 Dysphagia, pharyngeal phase: Secondary | ICD-10-CM | POA: Diagnosis not present

## 2015-03-23 DIAGNOSIS — I10 Essential (primary) hypertension: Secondary | ICD-10-CM | POA: Diagnosis not present

## 2015-03-23 DIAGNOSIS — A09 Infectious gastroenteritis and colitis, unspecified: Secondary | ICD-10-CM | POA: Diagnosis not present

## 2015-03-23 DIAGNOSIS — R112 Nausea with vomiting, unspecified: Secondary | ICD-10-CM | POA: Diagnosis not present

## 2015-03-23 DIAGNOSIS — G4489 Other headache syndrome: Secondary | ICD-10-CM | POA: Diagnosis not present

## 2015-03-23 DIAGNOSIS — K209 Esophagitis, unspecified: Secondary | ICD-10-CM | POA: Diagnosis not present

## 2015-03-23 DIAGNOSIS — R109 Unspecified abdominal pain: Secondary | ICD-10-CM | POA: Diagnosis not present

## 2015-03-23 DIAGNOSIS — N390 Urinary tract infection, site not specified: Secondary | ICD-10-CM | POA: Diagnosis not present

## 2015-03-23 DIAGNOSIS — N319 Neuromuscular dysfunction of bladder, unspecified: Secondary | ICD-10-CM | POA: Diagnosis not present

## 2015-03-23 DIAGNOSIS — I160 Hypertensive urgency: Secondary | ICD-10-CM | POA: Diagnosis not present

## 2015-03-23 DIAGNOSIS — R1310 Dysphagia, unspecified: Secondary | ICD-10-CM | POA: Diagnosis not present

## 2015-03-23 DIAGNOSIS — K760 Fatty (change of) liver, not elsewhere classified: Secondary | ICD-10-CM | POA: Diagnosis not present

## 2015-03-23 DIAGNOSIS — N3001 Acute cystitis with hematuria: Secondary | ICD-10-CM | POA: Diagnosis not present

## 2015-03-23 DIAGNOSIS — E119 Type 2 diabetes mellitus without complications: Secondary | ICD-10-CM | POA: Diagnosis not present

## 2015-03-23 DIAGNOSIS — K253 Acute gastric ulcer without hemorrhage or perforation: Secondary | ICD-10-CM | POA: Diagnosis not present

## 2015-03-23 DIAGNOSIS — A419 Sepsis, unspecified organism: Secondary | ICD-10-CM | POA: Diagnosis not present

## 2015-03-23 DIAGNOSIS — K2970 Gastritis, unspecified, without bleeding: Secondary | ICD-10-CM | POA: Diagnosis not present

## 2015-04-01 DIAGNOSIS — K529 Noninfective gastroenteritis and colitis, unspecified: Secondary | ICD-10-CM | POA: Diagnosis not present

## 2015-04-01 DIAGNOSIS — I10 Essential (primary) hypertension: Secondary | ICD-10-CM | POA: Diagnosis not present

## 2015-04-01 DIAGNOSIS — K219 Gastro-esophageal reflux disease without esophagitis: Secondary | ICD-10-CM | POA: Diagnosis not present

## 2015-04-01 DIAGNOSIS — E782 Mixed hyperlipidemia: Secondary | ICD-10-CM | POA: Diagnosis not present

## 2015-04-02 DIAGNOSIS — N3000 Acute cystitis without hematuria: Secondary | ICD-10-CM | POA: Diagnosis not present

## 2015-04-02 DIAGNOSIS — I4581 Long QT syndrome: Secondary | ICD-10-CM | POA: Diagnosis not present

## 2015-04-02 DIAGNOSIS — R42 Dizziness and giddiness: Secondary | ICD-10-CM | POA: Insufficient documentation

## 2015-04-02 DIAGNOSIS — R918 Other nonspecific abnormal finding of lung field: Secondary | ICD-10-CM | POA: Diagnosis not present

## 2015-04-02 DIAGNOSIS — R197 Diarrhea, unspecified: Secondary | ICD-10-CM | POA: Diagnosis not present

## 2015-04-02 DIAGNOSIS — IMO0002 Reserved for concepts with insufficient information to code with codable children: Secondary | ICD-10-CM | POA: Insufficient documentation

## 2015-04-02 DIAGNOSIS — R072 Precordial pain: Secondary | ICD-10-CM | POA: Diagnosis not present

## 2015-04-02 DIAGNOSIS — A419 Sepsis, unspecified organism: Secondary | ICD-10-CM | POA: Diagnosis not present

## 2015-04-02 DIAGNOSIS — G43009 Migraine without aura, not intractable, without status migrainosus: Secondary | ICD-10-CM | POA: Diagnosis not present

## 2015-04-02 DIAGNOSIS — I160 Hypertensive urgency: Secondary | ICD-10-CM | POA: Diagnosis not present

## 2015-04-02 DIAGNOSIS — N390 Urinary tract infection, site not specified: Secondary | ICD-10-CM | POA: Diagnosis not present

## 2015-04-02 DIAGNOSIS — R55 Syncope and collapse: Secondary | ICD-10-CM | POA: Diagnosis not present

## 2015-04-02 DIAGNOSIS — I10 Essential (primary) hypertension: Secondary | ICD-10-CM | POA: Diagnosis not present

## 2015-04-02 DIAGNOSIS — M5126 Other intervertebral disc displacement, lumbar region: Secondary | ICD-10-CM | POA: Diagnosis not present

## 2015-04-02 DIAGNOSIS — F331 Major depressive disorder, recurrent, moderate: Secondary | ICD-10-CM | POA: Diagnosis not present

## 2015-04-02 DIAGNOSIS — I820 Budd-Chiari syndrome: Secondary | ICD-10-CM | POA: Diagnosis not present

## 2015-04-02 DIAGNOSIS — J45909 Unspecified asthma, uncomplicated: Secondary | ICD-10-CM | POA: Diagnosis not present

## 2015-04-02 DIAGNOSIS — F39 Unspecified mood [affective] disorder: Secondary | ICD-10-CM | POA: Diagnosis not present

## 2015-04-02 DIAGNOSIS — Z87898 Personal history of other specified conditions: Secondary | ICD-10-CM

## 2015-04-02 DIAGNOSIS — I959 Hypotension, unspecified: Secondary | ICD-10-CM | POA: Diagnosis not present

## 2015-04-02 DIAGNOSIS — T380X5A Adverse effect of glucocorticoids and synthetic analogues, initial encounter: Secondary | ICD-10-CM | POA: Diagnosis not present

## 2015-04-02 DIAGNOSIS — R1084 Generalized abdominal pain: Secondary | ICD-10-CM

## 2015-04-02 DIAGNOSIS — E860 Dehydration: Secondary | ICD-10-CM | POA: Diagnosis not present

## 2015-04-02 DIAGNOSIS — R29810 Facial weakness: Secondary | ICD-10-CM | POA: Diagnosis not present

## 2015-04-02 DIAGNOSIS — G935 Compression of brain: Secondary | ICD-10-CM | POA: Diagnosis not present

## 2015-04-02 DIAGNOSIS — R4182 Altered mental status, unspecified: Secondary | ICD-10-CM | POA: Diagnosis not present

## 2015-04-02 DIAGNOSIS — D72829 Elevated white blood cell count, unspecified: Secondary | ICD-10-CM | POA: Diagnosis not present

## 2015-04-02 DIAGNOSIS — R0789 Other chest pain: Secondary | ICD-10-CM | POA: Diagnosis not present

## 2015-04-02 DIAGNOSIS — Z79899 Other long term (current) drug therapy: Secondary | ICD-10-CM | POA: Diagnosis not present

## 2015-04-02 DIAGNOSIS — F419 Anxiety disorder, unspecified: Secondary | ICD-10-CM | POA: Diagnosis not present

## 2015-04-02 DIAGNOSIS — R110 Nausea: Secondary | ICD-10-CM | POA: Diagnosis not present

## 2015-04-02 DIAGNOSIS — R51 Headache: Secondary | ICD-10-CM | POA: Diagnosis not present

## 2015-04-02 DIAGNOSIS — R1110 Vomiting, unspecified: Secondary | ICD-10-CM | POA: Diagnosis not present

## 2015-04-02 DIAGNOSIS — G43901 Migraine, unspecified, not intractable, with status migrainosus: Secondary | ICD-10-CM | POA: Diagnosis not present

## 2015-04-02 DIAGNOSIS — R109 Unspecified abdominal pain: Secondary | ICD-10-CM | POA: Insufficient documentation

## 2015-04-02 DIAGNOSIS — F330 Major depressive disorder, recurrent, mild: Secondary | ICD-10-CM | POA: Diagnosis not present

## 2015-04-02 DIAGNOSIS — R000 Tachycardia, unspecified: Secondary | ICD-10-CM | POA: Diagnosis not present

## 2015-04-02 DIAGNOSIS — K5900 Constipation, unspecified: Secondary | ICD-10-CM | POA: Diagnosis not present

## 2015-04-02 DIAGNOSIS — R339 Retention of urine, unspecified: Secondary | ICD-10-CM | POA: Insufficient documentation

## 2015-04-02 DIAGNOSIS — E119 Type 2 diabetes mellitus without complications: Secondary | ICD-10-CM | POA: Diagnosis not present

## 2015-04-02 DIAGNOSIS — G459 Transient cerebral ischemic attack, unspecified: Secondary | ICD-10-CM | POA: Diagnosis not present

## 2015-04-02 DIAGNOSIS — R2990 Unspecified symptoms and signs involving the nervous system: Secondary | ICD-10-CM | POA: Diagnosis not present

## 2015-04-02 DIAGNOSIS — F3160 Bipolar disorder, current episode mixed, unspecified: Secondary | ICD-10-CM | POA: Diagnosis not present

## 2015-04-02 DIAGNOSIS — I69998 Other sequelae following unspecified cerebrovascular disease: Secondary | ICD-10-CM | POA: Diagnosis not present

## 2015-04-02 DIAGNOSIS — N319 Neuromuscular dysfunction of bladder, unspecified: Secondary | ICD-10-CM | POA: Diagnosis not present

## 2015-04-02 DIAGNOSIS — R6520 Severe sepsis without septic shock: Secondary | ICD-10-CM | POA: Diagnosis not present

## 2015-04-02 DIAGNOSIS — R29898 Other symptoms and signs involving the musculoskeletal system: Secondary | ICD-10-CM

## 2015-04-02 DIAGNOSIS — G894 Chronic pain syndrome: Secondary | ICD-10-CM | POA: Insufficient documentation

## 2015-04-02 DIAGNOSIS — I6789 Other cerebrovascular disease: Secondary | ICD-10-CM | POA: Diagnosis not present

## 2015-04-02 DIAGNOSIS — F6810 Factitious disorder, unspecified: Secondary | ICD-10-CM | POA: Diagnosis not present

## 2015-04-02 DIAGNOSIS — I340 Nonrheumatic mitral (valve) insufficiency: Secondary | ICD-10-CM | POA: Diagnosis not present

## 2015-04-02 DIAGNOSIS — R531 Weakness: Secondary | ICD-10-CM | POA: Diagnosis not present

## 2015-04-02 DIAGNOSIS — R5383 Other fatigue: Secondary | ICD-10-CM | POA: Diagnosis not present

## 2015-04-02 HISTORY — DX: Personal history of other specified conditions: Z87.898

## 2015-04-02 HISTORY — DX: Other symptoms and signs involving the musculoskeletal system: R29.898

## 2015-04-02 HISTORY — DX: Generalized abdominal pain: R10.84

## 2015-04-02 HISTORY — DX: Retention of urine, unspecified: R33.9

## 2015-04-02 HISTORY — DX: Hypertensive urgency: I16.0

## 2015-04-03 DIAGNOSIS — R918 Other nonspecific abnormal finding of lung field: Secondary | ICD-10-CM | POA: Diagnosis not present

## 2015-04-03 DIAGNOSIS — I340 Nonrheumatic mitral (valve) insufficiency: Secondary | ICD-10-CM | POA: Diagnosis not present

## 2015-04-03 DIAGNOSIS — G935 Compression of brain: Secondary | ICD-10-CM | POA: Diagnosis not present

## 2015-04-03 DIAGNOSIS — R55 Syncope and collapse: Secondary | ICD-10-CM | POA: Diagnosis not present

## 2015-04-03 DIAGNOSIS — G43901 Migraine, unspecified, not intractable, with status migrainosus: Secondary | ICD-10-CM | POA: Diagnosis not present

## 2015-04-08 DIAGNOSIS — R197 Diarrhea, unspecified: Secondary | ICD-10-CM | POA: Diagnosis not present

## 2015-04-08 DIAGNOSIS — N319 Neuromuscular dysfunction of bladder, unspecified: Secondary | ICD-10-CM | POA: Diagnosis not present

## 2015-04-08 DIAGNOSIS — J45998 Other asthma: Secondary | ICD-10-CM | POA: Diagnosis not present

## 2015-04-08 DIAGNOSIS — E860 Dehydration: Secondary | ICD-10-CM | POA: Diagnosis not present

## 2015-04-08 DIAGNOSIS — I820 Budd-Chiari syndrome: Secondary | ICD-10-CM | POA: Diagnosis not present

## 2015-04-08 DIAGNOSIS — R55 Syncope and collapse: Secondary | ICD-10-CM | POA: Diagnosis not present

## 2015-04-08 DIAGNOSIS — N390 Urinary tract infection, site not specified: Secondary | ICD-10-CM | POA: Diagnosis not present

## 2015-04-08 DIAGNOSIS — D72829 Elevated white blood cell count, unspecified: Secondary | ICD-10-CM | POA: Diagnosis not present

## 2015-04-08 DIAGNOSIS — R2990 Unspecified symptoms and signs involving the nervous system: Secondary | ICD-10-CM | POA: Diagnosis not present

## 2015-04-08 DIAGNOSIS — R1084 Generalized abdominal pain: Secondary | ICD-10-CM | POA: Diagnosis not present

## 2015-04-08 DIAGNOSIS — G43009 Migraine without aura, not intractable, without status migrainosus: Secondary | ICD-10-CM | POA: Diagnosis not present

## 2015-04-08 DIAGNOSIS — N318 Other neuromuscular dysfunction of bladder: Secondary | ICD-10-CM | POA: Diagnosis not present

## 2015-04-08 DIAGNOSIS — J45909 Unspecified asthma, uncomplicated: Secondary | ICD-10-CM | POA: Diagnosis not present

## 2015-04-08 DIAGNOSIS — R0789 Other chest pain: Secondary | ICD-10-CM | POA: Diagnosis not present

## 2015-04-08 DIAGNOSIS — I69998 Other sequelae following unspecified cerebrovascular disease: Secondary | ICD-10-CM | POA: Diagnosis not present

## 2015-04-08 DIAGNOSIS — R531 Weakness: Secondary | ICD-10-CM | POA: Diagnosis not present

## 2015-04-08 DIAGNOSIS — B373 Candidiasis of vulva and vagina: Secondary | ICD-10-CM | POA: Diagnosis not present

## 2015-04-08 DIAGNOSIS — A419 Sepsis, unspecified organism: Secondary | ICD-10-CM | POA: Diagnosis not present

## 2015-04-08 DIAGNOSIS — F3160 Bipolar disorder, current episode mixed, unspecified: Secondary | ICD-10-CM | POA: Diagnosis not present

## 2015-04-08 DIAGNOSIS — E119 Type 2 diabetes mellitus without complications: Secondary | ICD-10-CM | POA: Diagnosis not present

## 2015-04-08 DIAGNOSIS — R079 Chest pain, unspecified: Secondary | ICD-10-CM | POA: Diagnosis not present

## 2015-04-08 DIAGNOSIS — E785 Hyperlipidemia, unspecified: Secondary | ICD-10-CM | POA: Diagnosis not present

## 2015-04-08 DIAGNOSIS — R339 Retention of urine, unspecified: Secondary | ICD-10-CM | POA: Diagnosis not present

## 2015-04-08 DIAGNOSIS — M6281 Muscle weakness (generalized): Secondary | ICD-10-CM | POA: Diagnosis not present

## 2015-04-08 DIAGNOSIS — R6520 Severe sepsis without septic shock: Secondary | ICD-10-CM | POA: Diagnosis not present

## 2015-04-08 DIAGNOSIS — I10 Essential (primary) hypertension: Secondary | ICD-10-CM | POA: Diagnosis not present

## 2015-04-08 DIAGNOSIS — Z87898 Personal history of other specified conditions: Secondary | ICD-10-CM | POA: Diagnosis not present

## 2015-04-08 DIAGNOSIS — R0602 Shortness of breath: Secondary | ICD-10-CM | POA: Diagnosis not present

## 2015-04-08 DIAGNOSIS — I160 Hypertensive urgency: Secondary | ICD-10-CM | POA: Diagnosis not present

## 2015-04-08 DIAGNOSIS — R8290 Unspecified abnormal findings in urine: Secondary | ICD-10-CM | POA: Diagnosis not present

## 2015-04-08 DIAGNOSIS — F419 Anxiety disorder, unspecified: Secondary | ICD-10-CM | POA: Diagnosis not present

## 2015-04-08 DIAGNOSIS — R072 Precordial pain: Secondary | ICD-10-CM | POA: Diagnosis not present

## 2015-04-08 DIAGNOSIS — F6810 Factitious disorder, unspecified: Secondary | ICD-10-CM | POA: Diagnosis not present

## 2015-04-08 DIAGNOSIS — F339 Major depressive disorder, recurrent, unspecified: Secondary | ICD-10-CM | POA: Diagnosis not present

## 2015-04-08 DIAGNOSIS — G43901 Migraine, unspecified, not intractable, with status migrainosus: Secondary | ICD-10-CM | POA: Diagnosis not present

## 2015-04-08 DIAGNOSIS — G935 Compression of brain: Secondary | ICD-10-CM | POA: Diagnosis not present

## 2015-04-08 DIAGNOSIS — Z79899 Other long term (current) drug therapy: Secondary | ICD-10-CM | POA: Diagnosis not present

## 2015-04-08 DIAGNOSIS — G894 Chronic pain syndrome: Secondary | ICD-10-CM | POA: Diagnosis not present

## 2015-04-08 DIAGNOSIS — N3000 Acute cystitis without hematuria: Secondary | ICD-10-CM | POA: Diagnosis not present

## 2015-04-08 DIAGNOSIS — R1110 Vomiting, unspecified: Secondary | ICD-10-CM | POA: Diagnosis not present

## 2015-04-14 DIAGNOSIS — R8290 Unspecified abnormal findings in urine: Secondary | ICD-10-CM | POA: Diagnosis not present

## 2015-04-19 DIAGNOSIS — Z79899 Other long term (current) drug therapy: Secondary | ICD-10-CM | POA: Diagnosis not present

## 2015-04-19 DIAGNOSIS — A419 Sepsis, unspecified organism: Secondary | ICD-10-CM | POA: Diagnosis not present

## 2015-04-19 DIAGNOSIS — R079 Chest pain, unspecified: Secondary | ICD-10-CM | POA: Diagnosis not present

## 2015-04-19 DIAGNOSIS — E119 Type 2 diabetes mellitus without complications: Secondary | ICD-10-CM | POA: Diagnosis not present

## 2015-04-19 DIAGNOSIS — R0602 Shortness of breath: Secondary | ICD-10-CM | POA: Diagnosis not present

## 2015-04-19 DIAGNOSIS — B373 Candidiasis of vulva and vagina: Secondary | ICD-10-CM | POA: Diagnosis not present

## 2015-04-19 DIAGNOSIS — I10 Essential (primary) hypertension: Secondary | ICD-10-CM | POA: Diagnosis not present

## 2015-04-19 DIAGNOSIS — E785 Hyperlipidemia, unspecified: Secondary | ICD-10-CM | POA: Diagnosis not present

## 2015-04-22 DIAGNOSIS — M6281 Muscle weakness (generalized): Secondary | ICD-10-CM | POA: Diagnosis not present

## 2015-04-22 DIAGNOSIS — G894 Chronic pain syndrome: Secondary | ICD-10-CM | POA: Diagnosis not present

## 2015-04-22 DIAGNOSIS — N318 Other neuromuscular dysfunction of bladder: Secondary | ICD-10-CM | POA: Diagnosis not present

## 2015-04-22 DIAGNOSIS — E119 Type 2 diabetes mellitus without complications: Secondary | ICD-10-CM | POA: Diagnosis not present

## 2015-04-24 DIAGNOSIS — F319 Bipolar disorder, unspecified: Secondary | ICD-10-CM | POA: Diagnosis not present

## 2015-04-24 DIAGNOSIS — G43119 Migraine with aura, intractable, without status migrainosus: Secondary | ICD-10-CM | POA: Diagnosis not present

## 2015-04-24 DIAGNOSIS — R339 Retention of urine, unspecified: Secondary | ICD-10-CM | POA: Diagnosis not present

## 2015-04-24 DIAGNOSIS — J45909 Unspecified asthma, uncomplicated: Secondary | ICD-10-CM | POA: Diagnosis not present

## 2015-04-24 DIAGNOSIS — I10 Essential (primary) hypertension: Secondary | ICD-10-CM | POA: Diagnosis not present

## 2015-04-24 DIAGNOSIS — N319 Neuromuscular dysfunction of bladder, unspecified: Secondary | ICD-10-CM | POA: Diagnosis not present

## 2015-04-24 DIAGNOSIS — E119 Type 2 diabetes mellitus without complications: Secondary | ICD-10-CM | POA: Diagnosis not present

## 2015-04-24 DIAGNOSIS — F419 Anxiety disorder, unspecified: Secondary | ICD-10-CM | POA: Diagnosis not present

## 2015-04-24 DIAGNOSIS — G935 Compression of brain: Secondary | ICD-10-CM | POA: Diagnosis not present

## 2015-04-27 DIAGNOSIS — J45909 Unspecified asthma, uncomplicated: Secondary | ICD-10-CM | POA: Diagnosis not present

## 2015-04-27 DIAGNOSIS — N319 Neuromuscular dysfunction of bladder, unspecified: Secondary | ICD-10-CM | POA: Diagnosis not present

## 2015-04-27 DIAGNOSIS — G43119 Migraine with aura, intractable, without status migrainosus: Secondary | ICD-10-CM | POA: Diagnosis not present

## 2015-04-27 DIAGNOSIS — F419 Anxiety disorder, unspecified: Secondary | ICD-10-CM | POA: Diagnosis not present

## 2015-04-27 DIAGNOSIS — I10 Essential (primary) hypertension: Secondary | ICD-10-CM | POA: Diagnosis not present

## 2015-04-27 DIAGNOSIS — G935 Compression of brain: Secondary | ICD-10-CM | POA: Diagnosis not present

## 2015-04-27 DIAGNOSIS — R339 Retention of urine, unspecified: Secondary | ICD-10-CM | POA: Diagnosis not present

## 2015-04-27 DIAGNOSIS — E119 Type 2 diabetes mellitus without complications: Secondary | ICD-10-CM | POA: Diagnosis not present

## 2015-04-27 DIAGNOSIS — F319 Bipolar disorder, unspecified: Secondary | ICD-10-CM | POA: Diagnosis not present

## 2015-04-29 DIAGNOSIS — J45909 Unspecified asthma, uncomplicated: Secondary | ICD-10-CM | POA: Diagnosis not present

## 2015-04-29 DIAGNOSIS — G43119 Migraine with aura, intractable, without status migrainosus: Secondary | ICD-10-CM | POA: Diagnosis not present

## 2015-04-29 DIAGNOSIS — F419 Anxiety disorder, unspecified: Secondary | ICD-10-CM | POA: Diagnosis not present

## 2015-04-29 DIAGNOSIS — R339 Retention of urine, unspecified: Secondary | ICD-10-CM | POA: Diagnosis not present

## 2015-04-29 DIAGNOSIS — F319 Bipolar disorder, unspecified: Secondary | ICD-10-CM | POA: Diagnosis not present

## 2015-04-29 DIAGNOSIS — G935 Compression of brain: Secondary | ICD-10-CM | POA: Diagnosis not present

## 2015-04-29 DIAGNOSIS — R079 Chest pain, unspecified: Secondary | ICD-10-CM | POA: Diagnosis not present

## 2015-04-29 DIAGNOSIS — N319 Neuromuscular dysfunction of bladder, unspecified: Secondary | ICD-10-CM | POA: Diagnosis not present

## 2015-04-29 DIAGNOSIS — E119 Type 2 diabetes mellitus without complications: Secondary | ICD-10-CM | POA: Diagnosis not present

## 2015-04-29 DIAGNOSIS — I10 Essential (primary) hypertension: Secondary | ICD-10-CM | POA: Diagnosis not present

## 2015-04-29 DIAGNOSIS — R072 Precordial pain: Secondary | ICD-10-CM | POA: Diagnosis not present

## 2015-04-30 DIAGNOSIS — F4320 Adjustment disorder, unspecified: Secondary | ICD-10-CM | POA: Diagnosis not present

## 2015-04-30 DIAGNOSIS — Z87898 Personal history of other specified conditions: Secondary | ICD-10-CM | POA: Diagnosis not present

## 2015-04-30 DIAGNOSIS — R300 Dysuria: Secondary | ICD-10-CM | POA: Diagnosis not present

## 2015-05-10 DIAGNOSIS — F419 Anxiety disorder, unspecified: Secondary | ICD-10-CM | POA: Diagnosis not present

## 2015-05-10 DIAGNOSIS — E119 Type 2 diabetes mellitus without complications: Secondary | ICD-10-CM | POA: Diagnosis not present

## 2015-05-10 DIAGNOSIS — I10 Essential (primary) hypertension: Secondary | ICD-10-CM | POA: Diagnosis not present

## 2015-05-10 DIAGNOSIS — G935 Compression of brain: Secondary | ICD-10-CM | POA: Diagnosis not present

## 2015-05-10 DIAGNOSIS — J45909 Unspecified asthma, uncomplicated: Secondary | ICD-10-CM | POA: Diagnosis not present

## 2015-05-10 DIAGNOSIS — F319 Bipolar disorder, unspecified: Secondary | ICD-10-CM | POA: Diagnosis not present

## 2015-05-10 DIAGNOSIS — N319 Neuromuscular dysfunction of bladder, unspecified: Secondary | ICD-10-CM | POA: Diagnosis not present

## 2015-05-10 DIAGNOSIS — R339 Retention of urine, unspecified: Secondary | ICD-10-CM | POA: Diagnosis not present

## 2015-05-10 DIAGNOSIS — G43119 Migraine with aura, intractable, without status migrainosus: Secondary | ICD-10-CM | POA: Diagnosis not present

## 2015-05-11 DIAGNOSIS — G43119 Migraine with aura, intractable, without status migrainosus: Secondary | ICD-10-CM | POA: Diagnosis not present

## 2015-05-11 DIAGNOSIS — N319 Neuromuscular dysfunction of bladder, unspecified: Secondary | ICD-10-CM | POA: Diagnosis not present

## 2015-05-11 DIAGNOSIS — F419 Anxiety disorder, unspecified: Secondary | ICD-10-CM | POA: Diagnosis not present

## 2015-05-11 DIAGNOSIS — R339 Retention of urine, unspecified: Secondary | ICD-10-CM | POA: Diagnosis not present

## 2015-05-11 DIAGNOSIS — G43001 Migraine without aura, not intractable, with status migrainosus: Secondary | ICD-10-CM | POA: Diagnosis not present

## 2015-05-11 DIAGNOSIS — G935 Compression of brain: Secondary | ICD-10-CM | POA: Diagnosis not present

## 2015-05-11 DIAGNOSIS — E119 Type 2 diabetes mellitus without complications: Secondary | ICD-10-CM | POA: Diagnosis not present

## 2015-05-11 DIAGNOSIS — J45909 Unspecified asthma, uncomplicated: Secondary | ICD-10-CM | POA: Diagnosis not present

## 2015-05-11 DIAGNOSIS — I10 Essential (primary) hypertension: Secondary | ICD-10-CM | POA: Diagnosis not present

## 2015-05-11 DIAGNOSIS — F319 Bipolar disorder, unspecified: Secondary | ICD-10-CM | POA: Diagnosis not present

## 2015-05-12 DIAGNOSIS — G43119 Migraine with aura, intractable, without status migrainosus: Secondary | ICD-10-CM | POA: Diagnosis not present

## 2015-05-12 DIAGNOSIS — I10 Essential (primary) hypertension: Secondary | ICD-10-CM | POA: Diagnosis not present

## 2015-05-12 DIAGNOSIS — F419 Anxiety disorder, unspecified: Secondary | ICD-10-CM | POA: Diagnosis not present

## 2015-05-12 DIAGNOSIS — N319 Neuromuscular dysfunction of bladder, unspecified: Secondary | ICD-10-CM | POA: Diagnosis not present

## 2015-05-12 DIAGNOSIS — E119 Type 2 diabetes mellitus without complications: Secondary | ICD-10-CM | POA: Diagnosis not present

## 2015-05-12 DIAGNOSIS — G935 Compression of brain: Secondary | ICD-10-CM | POA: Diagnosis not present

## 2015-05-12 DIAGNOSIS — F319 Bipolar disorder, unspecified: Secondary | ICD-10-CM | POA: Diagnosis not present

## 2015-05-12 DIAGNOSIS — J45909 Unspecified asthma, uncomplicated: Secondary | ICD-10-CM | POA: Diagnosis not present

## 2015-05-12 DIAGNOSIS — R339 Retention of urine, unspecified: Secondary | ICD-10-CM | POA: Diagnosis not present

## 2015-05-19 ENCOUNTER — Emergency Department (HOSPITAL_COMMUNITY): Payer: Commercial Managed Care - HMO

## 2015-05-19 ENCOUNTER — Emergency Department (HOSPITAL_COMMUNITY)
Admission: EM | Admit: 2015-05-19 | Discharge: 2015-05-19 | Disposition: A | Discharge status: Home / Self Care | Payer: Commercial Managed Care - HMO | Attending: Emergency Medicine | Admitting: Emergency Medicine

## 2015-05-19 ENCOUNTER — Encounter (HOSPITAL_COMMUNITY): Payer: Self-pay | Admitting: *Deleted

## 2015-05-19 DIAGNOSIS — J449 Chronic obstructive pulmonary disease, unspecified: Secondary | ICD-10-CM | POA: Diagnosis not present

## 2015-05-19 DIAGNOSIS — Z79899 Other long term (current) drug therapy: Secondary | ICD-10-CM | POA: Diagnosis not present

## 2015-05-19 DIAGNOSIS — R0602 Shortness of breath: Secondary | ICD-10-CM | POA: Diagnosis not present

## 2015-05-19 DIAGNOSIS — F410 Panic disorder [episodic paroxysmal anxiety] without agoraphobia: Secondary | ICD-10-CM | POA: Insufficient documentation

## 2015-05-19 DIAGNOSIS — G43909 Migraine, unspecified, not intractable, without status migrainosus: Secondary | ICD-10-CM | POA: Diagnosis not present

## 2015-05-19 DIAGNOSIS — Z880 Allergy status to penicillin: Secondary | ICD-10-CM | POA: Diagnosis not present

## 2015-05-19 DIAGNOSIS — Z8669 Personal history of other diseases of the nervous system and sense organs: Secondary | ICD-10-CM | POA: Insufficient documentation

## 2015-05-19 DIAGNOSIS — K219 Gastro-esophageal reflux disease without esophagitis: Secondary | ICD-10-CM | POA: Insufficient documentation

## 2015-05-19 DIAGNOSIS — R0789 Other chest pain: Secondary | ICD-10-CM | POA: Diagnosis not present

## 2015-05-19 DIAGNOSIS — I10 Essential (primary) hypertension: Secondary | ICD-10-CM | POA: Insufficient documentation

## 2015-05-19 DIAGNOSIS — F329 Major depressive disorder, single episode, unspecified: Secondary | ICD-10-CM | POA: Insufficient documentation

## 2015-05-19 DIAGNOSIS — Z7951 Long term (current) use of inhaled steroids: Secondary | ICD-10-CM | POA: Insufficient documentation

## 2015-05-19 DIAGNOSIS — Z1231 Encounter for screening mammogram for malignant neoplasm of breast: Secondary | ICD-10-CM | POA: Diagnosis not present

## 2015-05-19 DIAGNOSIS — R531 Weakness: Secondary | ICD-10-CM | POA: Insufficient documentation

## 2015-05-19 DIAGNOSIS — R079 Chest pain, unspecified: Secondary | ICD-10-CM | POA: Diagnosis not present

## 2015-05-19 LAB — CBC
HEMATOCRIT: 32.4 % — AB (ref 36.0–46.0)
HEMOGLOBIN: 10.2 g/dL — AB (ref 12.0–15.0)
MCH: 29.3 pg (ref 26.0–34.0)
MCHC: 31.5 g/dL (ref 30.0–36.0)
MCV: 93.1 fL (ref 78.0–100.0)
Platelets: 333 10*3/uL (ref 150–400)
RBC: 3.48 MIL/uL — AB (ref 3.87–5.11)
RDW: 14.3 % (ref 11.5–15.5)
WBC: 11.4 10*3/uL — ABNORMAL HIGH (ref 4.0–10.5)

## 2015-05-19 LAB — COMPREHENSIVE METABOLIC PANEL
ALBUMIN: 3.5 g/dL (ref 3.5–5.0)
ALK PHOS: 84 U/L (ref 38–126)
ALT: 17 U/L (ref 14–54)
ANION GAP: 12 (ref 5–15)
AST: 17 U/L (ref 15–41)
BILIRUBIN TOTAL: 0.4 mg/dL (ref 0.3–1.2)
BUN: 10 mg/dL (ref 6–20)
CALCIUM: 8.9 mg/dL (ref 8.9–10.3)
CO2: 23 mmol/L (ref 22–32)
CREATININE: 0.88 mg/dL (ref 0.44–1.00)
Chloride: 105 mmol/L (ref 101–111)
GFR calc Af Amer: >60 mL/min (ref >60)
GFR calc non Af Amer: >60 mL/min (ref >60)
GLUCOSE: 117 mg/dL — AB (ref 65–99)
Potassium: 4.1 mmol/L (ref 3.5–5.1)
SODIUM: 140 mmol/L (ref 135–145)
TOTAL PROTEIN: 6.8 g/dL (ref 6.5–8.1)

## 2015-05-19 LAB — PROTIME-INR
INR: 1.01 (ref 0.00–1.49)
Prothrombin Time: 13.5 seconds (ref 11.6–15.2)

## 2015-05-19 LAB — MAGNESIUM: Magnesium: 1.6 mg/dL — ABNORMAL LOW (ref 1.7–2.4)

## 2015-05-19 LAB — TROPONIN I: Troponin I: <0.03 ng/mL (ref <0.031)

## 2015-05-19 MED ORDER — OXYCODONE-ACETAMINOPHEN 5-325 MG PO TABS
1.0000 | ORAL_TABLET | Freq: Once | ORAL | Status: AC
  Administered 2015-05-19: 1 via ORAL
  Filled 2015-05-19: qty 1

## 2015-05-19 MED ORDER — ALBUTEROL SULFATE (2.5 MG/3ML) 0.083% IN NEBU
5.0000 mg | INHALATION_SOLUTION | Freq: Once | RESPIRATORY_TRACT | Status: AC
  Administered 2015-05-19: 5 mg via RESPIRATORY_TRACT
  Filled 2015-05-19: qty 6

## 2015-05-19 MED ORDER — LORAZEPAM 2 MG/ML IJ SOLN
1.0000 mg | Freq: Once | INTRAMUSCULAR | Status: AC
  Administered 2015-05-19: 1 mg via INTRAVENOUS
  Filled 2015-05-19: qty 1

## 2015-05-19 MED ORDER — ASPIRIN 81 MG PO CHEW
324.0000 mg | CHEWABLE_TABLET | Freq: Once | ORAL | Status: DC
  Filled 2015-05-19: qty 4

## 2015-05-19 MED ORDER — METHYLPREDNISOLONE SODIUM SUCC 125 MG IJ SOLR
125.0000 mg | Freq: Once | INTRAMUSCULAR | Status: AC
  Administered 2015-05-19: 125 mg via INTRAVENOUS
  Filled 2015-05-19: qty 2

## 2015-05-19 MED ORDER — PREDNISONE 20 MG PO TABS: 40.0000 mg | ORAL_TABLET | Freq: Every day | ORAL | Status: DC

## 2015-05-19 NOTE — ED Notes (Signed)
EKG delayed due to limited room and equipment availability.

## 2015-05-19 NOTE — ED Notes (Signed)
Pt states she has to be cathed in order to obtain a urine specimen.

## 2015-05-19 NOTE — ED Provider Notes (Signed)
CSN: [id number]     Arrival date & time 05/19/15  1240 History   First MD Initiated Contact with Patient 05/19/15 1249     Chief Complaint  Patient presents with  . Chest Pain     HPI  Patient presents with concern for sternal chest pressure. Symptoms began about 2 hours ago. Since onset pain has been persistent. Patient has baseline dyspnea, is on oxygen 24/7. Patient has COPD. Oxygen requirement is unchanged. Pain is radiating to the left shoulder, worse with exertion, motion. Patient received aspirin, nitroglycerin en route, with minimal change. Patient denies history of cardiac disease.   Past Medical History  Diagnosis Date  . Chronic migraine   . ADHD (attention deficit hyperactivity disorder)   . Anxiety   . Asthma   . Depression   . GERD (gastroesophageal reflux disease)   . Hypertension   . Psychiatric disorder   . Allergic rhinitis   . Panic attack   . Chiari malformation type I [facility])    Past Surgical History  Procedure Laterality Date  . Cholecystectomy    . Tubal ligation     Family History  Problem Relation Age of Onset  . Stroke Mother     x 5  . Hypertension Mother   . Heart disease Mother   . Diabetes Mother   . Diabetes Father   . Heart disease Maternal Grandmother   . Breast cancer Paternal Grandmother   . Asthma Brother    Social History  Substance Use Topics  . Smoking status: Never Smoker   . Smokeless tobacco: None  . Alcohol Use: No   OB History    No data available     Review of Systems  Constitutional:       Per HPI, otherwise negative  HENT:       Per HPI, otherwise negative  Respiratory:       Per HPI, otherwise negative  Cardiovascular:       Per HPI, otherwise negative  Gastrointestinal: Negative for vomiting.  Endocrine:       Negative aside from HPI  Genitourinary:       Neg aside from HPI   Musculoskeletal:       Per HPI, otherwise negative  Skin: Negative.   Neurological: Positive for weakness. Negative  for syncope.      Allergies  Bactrim; Penicillins; Ciprofloxacin; and Sulfa antibiotics  Home Medications   Prior to Admission medications   Medication Sig Start Date End Date Taking? Authorizing Provider  albuterol (PROVENTIL) (2.5 MG/3ML) 0.083% nebulizer solution Take 2.5 mg by nebulization every 6 (six) hours as needed.   Yes Historical Provider, MD  amLODipine (NORVASC) 10 MG tablet Take 10 mg by mouth daily.   Yes Historical Provider, MD  bethanechol (URECHOLINE) 25 MG tablet Take 37.5 mg by mouth 4 (four) times daily.   Yes Historical Provider, MD  citalopram (CELEXA) 20 MG tablet Take 20 mg by mouth daily.   Yes Historical Provider, MD  cloNIDine (CATAPRES) 0.1 MG tablet Take 0.1 mg by mouth 2 (two) times daily.   Yes Historical Provider, MD  diazepam (VALIUM) 10 MG tablet Take 10 mg by mouth daily.   Yes Historical Provider, MD  diltiazem (CARDIZEM CD) 240 MG 24 hr capsule Take 240 mg by mouth daily.   Yes Historical Provider, MD  divalproex (DEPAKOTE) 125 MG DR tablet Take 250 mg by mouth 2 (two) times daily.   Yes Historical Provider, MD  gabapentin (NEURONTIN) 300 MG capsule  Take 900 mg by mouth 4 (four) times daily.   Yes Historical Provider, MD  mometasone-formoterol (DULERA) 100-5 MCG/ACT AERO Inhale 2 puffs into the lungs as needed for wheezing.    Yes Historical Provider, MD  Potassium 99 MG TABS Take 99 mg by mouth daily.   Yes Historical Provider, MD  QUEtiapine (SEROQUEL XR) 300 MG 24 hr tablet Take 300 mg by mouth at bedtime.   Yes Historical Provider, MD  topiramate (TOPAMAX) 100 MG tablet Take 100 mg by mouth daily as needed (headache).    Yes Historical Provider, MD   BP 146/125 mmHg  Pulse 93  Temp(Src) 97.4 F (36.3 C) (Oral)  Resp 17  SpO2 99%  LMP 02/02/2015 Physical Exam  Constitutional: She is oriented to person, place, and time. She appears well-developed and well-nourished. No distress.  HENT:  Head: Normocephalic and atraumatic.  Eyes:  Conjunctivae and EOM are normal.  Cardiovascular: Normal rate and regular rhythm.   Pulmonary/Chest: Effort normal and breath sounds normal. No stridor. No respiratory distress. She has no wheezes.  Abdominal: She exhibits no distension. There is no tenderness.  Musculoskeletal: She exhibits no edema.  Neurological: She is alert and oriented to person, place, and time. No cranial nerve deficit.  Skin: Skin is warm and dry.  Psychiatric: She has a normal mood and affect.  Nursing note and vitals reviewed.   ED Course  Procedures (including critical care time) Labs Review Labs Reviewed  CBC - Abnormal; Notable for the following:    WBC 11.4 (*)    RBC 3.48 (*)    Hemoglobin 10.2 (*)    HCT 32.4 (*)    All other components within normal limits  COMPREHENSIVE METABOLIC PANEL - Abnormal; Notable for the following:    Glucose, Bld 117 (*)    All other components within normal limits  MAGNESIUM - Abnormal; Notable for the following:    Magnesium 1.6 (*)    All other components within normal limits  PROTIME-INR  TROPONIN I    Imaging Review Dg Chest 2 View  05/19/2015  CLINICAL DATA:  Acute chest pain and shortness of breath. EXAM: CHEST  2 VIEW COMPARISON:  April 29, 2015. FINDINGS: The heart size and mediastinal contours are within normal limits. Both lungs are clear. No pneumothorax or pleural effusion is noted. The visualized skeletal structures are unremarkable. IMPRESSION: No active cardiopulmonary disease. Electronically Signed   By: Marijo Conception, M.D.   On: 05/19/2015 14:20   I have personally reviewed and evaluated these images and lab results as part of my medical decision-making.   EKG Interpretation   Date/Time:  Wednesday May 19 2015 13:12:06 EDT Ventricular Rate:  90 PR Interval:  188 QRS Duration: 86 QT Interval:  386 QTC Calculation: 472 R Axis:   76 Text Interpretation:  Sinus rhythm Sinus rhythm Normal ECG Confirmed by  Carmin Muskrat  MD ([id number]) on  05/19/2015 2:15:05 PM     3:32 PM Pulmonary exam the patient is calm, in no distress. We discussed all findings. Patient will follow up with her pulmonologist.  MDM  History of COPD presents with chest pain. Here, the patient is awake and alert, has no evidence for ongoing coronary ischemia. Patient's breathing improved here, she remained in no distress, hemodynamically stable. With low suspicion for ACS, and a known history of COPD, the patient was started on scheduled albuterol, steroids, will follow up with her pulmonologist.   Carmin Muskrat, MD 05/19/15 [phone]

## 2015-05-19 NOTE — ED Notes (Signed)
NAD at this time. Pt is stable and leaving with her husband. 

## 2015-05-19 NOTE — Discharge Instructions (Signed)
As discussed, your evaluation today has been largely reassuring.  But, it is important that you monitor your condition carefully, and do not hesitate to return to the ED if you develop new, or concerning changes in your condition. ? ?Otherwise, please follow-up with your physician for appropriate ongoing care. ? ?

## 2015-05-19 NOTE — ED Notes (Signed)
Per [facility] Pt has CP, sharp center of chest radiating to L shoulder.  Started at 11:45am. Increases with exertion and movement.  324mg  of ASA, 2 nitro, in route. Vs are as follows: BP: 121/72 HR: 107, SPO2 99% on 2L.

## 2015-05-20 DIAGNOSIS — G43119 Migraine with aura, intractable, without status migrainosus: Secondary | ICD-10-CM | POA: Diagnosis not present

## 2015-05-20 DIAGNOSIS — J45909 Unspecified asthma, uncomplicated: Secondary | ICD-10-CM | POA: Diagnosis not present

## 2015-05-20 DIAGNOSIS — F319 Bipolar disorder, unspecified: Secondary | ICD-10-CM | POA: Diagnosis not present

## 2015-05-20 DIAGNOSIS — G935 Compression of brain: Secondary | ICD-10-CM | POA: Diagnosis not present

## 2015-05-20 DIAGNOSIS — I10 Essential (primary) hypertension: Secondary | ICD-10-CM | POA: Diagnosis not present

## 2015-05-20 DIAGNOSIS — F419 Anxiety disorder, unspecified: Secondary | ICD-10-CM | POA: Diagnosis not present

## 2015-05-20 DIAGNOSIS — N319 Neuromuscular dysfunction of bladder, unspecified: Secondary | ICD-10-CM | POA: Diagnosis not present

## 2015-05-20 DIAGNOSIS — R339 Retention of urine, unspecified: Secondary | ICD-10-CM | POA: Diagnosis not present

## 2015-05-20 DIAGNOSIS — E119 Type 2 diabetes mellitus without complications: Secondary | ICD-10-CM | POA: Diagnosis not present

## 2015-05-23 DIAGNOSIS — G894 Chronic pain syndrome: Secondary | ICD-10-CM | POA: Diagnosis not present

## 2015-05-23 DIAGNOSIS — E119 Type 2 diabetes mellitus without complications: Secondary | ICD-10-CM | POA: Diagnosis not present

## 2015-05-23 DIAGNOSIS — J45998 Other asthma: Secondary | ICD-10-CM | POA: Diagnosis not present

## 2015-05-23 DIAGNOSIS — I10 Essential (primary) hypertension: Secondary | ICD-10-CM | POA: Diagnosis not present

## 2015-05-24 DIAGNOSIS — R5383 Other fatigue: Secondary | ICD-10-CM | POA: Diagnosis not present

## 2015-05-24 DIAGNOSIS — J4540 Moderate persistent asthma, uncomplicated: Secondary | ICD-10-CM | POA: Diagnosis not present

## 2015-05-24 DIAGNOSIS — G4733 Obstructive sleep apnea (adult) (pediatric): Secondary | ICD-10-CM | POA: Diagnosis not present

## 2015-05-24 DIAGNOSIS — J301 Allergic rhinitis due to pollen: Secondary | ICD-10-CM | POA: Diagnosis not present

## 2015-05-25 DIAGNOSIS — E559 Vitamin D deficiency, unspecified: Secondary | ICD-10-CM | POA: Diagnosis not present

## 2015-05-26 DIAGNOSIS — S0181XA Laceration without foreign body of other part of head, initial encounter: Secondary | ICD-10-CM | POA: Diagnosis not present

## 2015-05-26 DIAGNOSIS — Z881 Allergy status to other antibiotic agents status: Secondary | ICD-10-CM | POA: Diagnosis not present

## 2015-05-26 DIAGNOSIS — M26609 Unspecified temporomandibular joint disorder, unspecified side: Secondary | ICD-10-CM | POA: Diagnosis not present

## 2015-05-26 DIAGNOSIS — Z9981 Dependence on supplemental oxygen: Secondary | ICD-10-CM | POA: Diagnosis not present

## 2015-05-26 DIAGNOSIS — F319 Bipolar disorder, unspecified: Secondary | ICD-10-CM | POA: Diagnosis not present

## 2015-05-26 DIAGNOSIS — S0990XA Unspecified injury of head, initial encounter: Secondary | ICD-10-CM | POA: Diagnosis not present

## 2015-05-26 DIAGNOSIS — I10 Essential (primary) hypertension: Secondary | ICD-10-CM | POA: Diagnosis not present

## 2015-05-26 DIAGNOSIS — J45909 Unspecified asthma, uncomplicated: Secondary | ICD-10-CM | POA: Diagnosis not present

## 2015-05-26 DIAGNOSIS — S0181XD Laceration without foreign body of other part of head, subsequent encounter: Secondary | ICD-10-CM | POA: Diagnosis not present

## 2015-05-26 DIAGNOSIS — E119 Type 2 diabetes mellitus without complications: Secondary | ICD-10-CM | POA: Diagnosis not present

## 2015-05-26 DIAGNOSIS — R51 Headache: Secondary | ICD-10-CM | POA: Diagnosis not present

## 2015-05-26 DIAGNOSIS — F419 Anxiety disorder, unspecified: Secondary | ICD-10-CM | POA: Diagnosis not present

## 2015-05-26 DIAGNOSIS — R531 Weakness: Secondary | ICD-10-CM | POA: Diagnosis not present

## 2015-05-26 DIAGNOSIS — R112 Nausea with vomiting, unspecified: Secondary | ICD-10-CM | POA: Diagnosis not present

## 2015-05-26 DIAGNOSIS — G43119 Migraine with aura, intractable, without status migrainosus: Secondary | ICD-10-CM | POA: Diagnosis not present

## 2015-05-26 DIAGNOSIS — R072 Precordial pain: Secondary | ICD-10-CM | POA: Diagnosis not present

## 2015-05-26 DIAGNOSIS — Z880 Allergy status to penicillin: Secondary | ICD-10-CM | POA: Diagnosis not present

## 2015-05-26 DIAGNOSIS — Z882 Allergy status to sulfonamides status: Secondary | ICD-10-CM | POA: Diagnosis not present

## 2015-05-26 DIAGNOSIS — R55 Syncope and collapse: Secondary | ICD-10-CM | POA: Diagnosis not present

## 2015-05-26 DIAGNOSIS — R339 Retention of urine, unspecified: Secondary | ICD-10-CM | POA: Diagnosis not present

## 2015-05-26 DIAGNOSIS — N319 Neuromuscular dysfunction of bladder, unspecified: Secondary | ICD-10-CM | POA: Diagnosis not present

## 2015-05-26 DIAGNOSIS — R079 Chest pain, unspecified: Secondary | ICD-10-CM | POA: Diagnosis not present

## 2015-05-26 DIAGNOSIS — J9811 Atelectasis: Secondary | ICD-10-CM | POA: Diagnosis not present

## 2015-05-26 DIAGNOSIS — F410 Panic disorder [episodic paroxysmal anxiety] without agoraphobia: Secondary | ICD-10-CM | POA: Diagnosis not present

## 2015-05-26 DIAGNOSIS — S069X3A Unspecified intracranial injury with loss of consciousness of 1 hour to 5 hours 59 minutes, initial encounter: Secondary | ICD-10-CM | POA: Diagnosis not present

## 2015-05-26 DIAGNOSIS — M6281 Muscle weakness (generalized): Secondary | ICD-10-CM | POA: Diagnosis not present

## 2015-05-26 DIAGNOSIS — G935 Compression of brain: Secondary | ICD-10-CM | POA: Diagnosis not present

## 2015-05-27 DIAGNOSIS — I6789 Other cerebrovascular disease: Secondary | ICD-10-CM | POA: Diagnosis not present

## 2015-05-27 DIAGNOSIS — E119 Type 2 diabetes mellitus without complications: Secondary | ICD-10-CM | POA: Diagnosis not present

## 2015-05-27 DIAGNOSIS — R079 Chest pain, unspecified: Secondary | ICD-10-CM | POA: Diagnosis not present

## 2015-05-27 DIAGNOSIS — F4310 Post-traumatic stress disorder, unspecified: Secondary | ICD-10-CM | POA: Diagnosis not present

## 2015-05-27 DIAGNOSIS — R51 Headache: Secondary | ICD-10-CM | POA: Diagnosis not present

## 2015-05-27 DIAGNOSIS — M6281 Muscle weakness (generalized): Secondary | ICD-10-CM | POA: Diagnosis not present

## 2015-05-27 DIAGNOSIS — R072 Precordial pain: Secondary | ICD-10-CM | POA: Diagnosis not present

## 2015-05-27 DIAGNOSIS — I10 Essential (primary) hypertension: Secondary | ICD-10-CM | POA: Diagnosis not present

## 2015-05-27 DIAGNOSIS — F410 Panic disorder [episodic paroxysmal anxiety] without agoraphobia: Secondary | ICD-10-CM | POA: Diagnosis not present

## 2015-05-27 DIAGNOSIS — Z882 Allergy status to sulfonamides status: Secondary | ICD-10-CM | POA: Diagnosis not present

## 2015-05-27 DIAGNOSIS — M26609 Unspecified temporomandibular joint disorder, unspecified side: Secondary | ICD-10-CM | POA: Diagnosis not present

## 2015-05-27 DIAGNOSIS — S0181XA Laceration without foreign body of other part of head, initial encounter: Secondary | ICD-10-CM | POA: Diagnosis not present

## 2015-05-27 DIAGNOSIS — R030 Elevated blood-pressure reading, without diagnosis of hypertension: Secondary | ICD-10-CM | POA: Diagnosis not present

## 2015-05-27 DIAGNOSIS — R531 Weakness: Secondary | ICD-10-CM | POA: Diagnosis not present

## 2015-05-27 DIAGNOSIS — M279 Disease of jaws, unspecified: Secondary | ICD-10-CM | POA: Diagnosis not present

## 2015-05-27 DIAGNOSIS — Z881 Allergy status to other antibiotic agents status: Secondary | ICD-10-CM | POA: Diagnosis not present

## 2015-05-27 DIAGNOSIS — Z880 Allergy status to penicillin: Secondary | ICD-10-CM | POA: Diagnosis not present

## 2015-05-28 DIAGNOSIS — R531 Weakness: Secondary | ICD-10-CM | POA: Diagnosis not present

## 2015-05-31 DIAGNOSIS — R531 Weakness: Secondary | ICD-10-CM | POA: Diagnosis not present

## 2015-05-31 DIAGNOSIS — F449 Dissociative and conversion disorder, unspecified: Secondary | ICD-10-CM | POA: Diagnosis not present

## 2015-05-31 DIAGNOSIS — F410 Panic disorder [episodic paroxysmal anxiety] without agoraphobia: Secondary | ICD-10-CM | POA: Diagnosis not present

## 2015-05-31 DIAGNOSIS — I4891 Unspecified atrial fibrillation: Secondary | ICD-10-CM | POA: Diagnosis not present

## 2015-05-31 DIAGNOSIS — I10 Essential (primary) hypertension: Secondary | ICD-10-CM | POA: Diagnosis not present

## 2015-05-31 DIAGNOSIS — R0789 Other chest pain: Secondary | ICD-10-CM | POA: Diagnosis not present

## 2015-05-31 DIAGNOSIS — R200 Anesthesia of skin: Secondary | ICD-10-CM | POA: Diagnosis not present

## 2015-05-31 DIAGNOSIS — R079 Chest pain, unspecified: Secondary | ICD-10-CM | POA: Diagnosis not present

## 2015-05-31 DIAGNOSIS — R9439 Abnormal result of other cardiovascular function study: Secondary | ICD-10-CM | POA: Diagnosis not present

## 2015-05-31 DIAGNOSIS — R1313 Dysphagia, pharyngeal phase: Secondary | ICD-10-CM | POA: Diagnosis not present

## 2015-05-31 DIAGNOSIS — I209 Angina pectoris, unspecified: Secondary | ICD-10-CM | POA: Diagnosis not present

## 2015-06-01 DIAGNOSIS — R930 Abnormal findings on diagnostic imaging of skull and head, not elsewhere classified: Secondary | ICD-10-CM | POA: Diagnosis not present

## 2015-06-01 DIAGNOSIS — I10 Essential (primary) hypertension: Secondary | ICD-10-CM | POA: Diagnosis not present

## 2015-06-01 DIAGNOSIS — R413 Other amnesia: Secondary | ICD-10-CM | POA: Diagnosis not present

## 2015-06-01 DIAGNOSIS — E119 Type 2 diabetes mellitus without complications: Secondary | ICD-10-CM | POA: Diagnosis not present

## 2015-06-01 DIAGNOSIS — R9439 Abnormal result of other cardiovascular function study: Secondary | ICD-10-CM | POA: Diagnosis not present

## 2015-06-01 DIAGNOSIS — R0789 Other chest pain: Secondary | ICD-10-CM | POA: Diagnosis not present

## 2015-06-02 DIAGNOSIS — K219 Gastro-esophageal reflux disease without esophagitis: Secondary | ICD-10-CM | POA: Diagnosis not present

## 2015-06-02 DIAGNOSIS — R9439 Abnormal result of other cardiovascular function study: Secondary | ICD-10-CM | POA: Diagnosis not present

## 2015-06-02 DIAGNOSIS — I4891 Unspecified atrial fibrillation: Secondary | ICD-10-CM | POA: Diagnosis not present

## 2015-06-02 DIAGNOSIS — E785 Hyperlipidemia, unspecified: Secondary | ICD-10-CM | POA: Diagnosis not present

## 2015-06-02 DIAGNOSIS — Z9889 Other specified postprocedural states: Secondary | ICD-10-CM | POA: Diagnosis not present

## 2015-06-02 DIAGNOSIS — R079 Chest pain, unspecified: Secondary | ICD-10-CM | POA: Diagnosis not present

## 2015-06-02 DIAGNOSIS — Z9049 Acquired absence of other specified parts of digestive tract: Secondary | ICD-10-CM | POA: Diagnosis not present

## 2015-06-02 DIAGNOSIS — R0602 Shortness of breath: Secondary | ICD-10-CM | POA: Diagnosis not present

## 2015-06-02 DIAGNOSIS — I10 Essential (primary) hypertension: Secondary | ICD-10-CM | POA: Diagnosis not present

## 2015-06-02 DIAGNOSIS — R0789 Other chest pain: Secondary | ICD-10-CM | POA: Diagnosis not present

## 2015-06-02 DIAGNOSIS — E119 Type 2 diabetes mellitus without complications: Secondary | ICD-10-CM | POA: Diagnosis not present

## 2015-06-03 DIAGNOSIS — Z880 Allergy status to penicillin: Secondary | ICD-10-CM | POA: Diagnosis not present

## 2015-06-03 DIAGNOSIS — Z881 Allergy status to other antibiotic agents status: Secondary | ICD-10-CM | POA: Diagnosis not present

## 2015-06-03 DIAGNOSIS — Z882 Allergy status to sulfonamides status: Secondary | ICD-10-CM | POA: Diagnosis not present

## 2015-06-03 DIAGNOSIS — N289 Disorder of kidney and ureter, unspecified: Secondary | ICD-10-CM | POA: Diagnosis not present

## 2015-06-03 DIAGNOSIS — R079 Chest pain, unspecified: Secondary | ICD-10-CM | POA: Diagnosis not present

## 2015-06-03 DIAGNOSIS — R072 Precordial pain: Secondary | ICD-10-CM | POA: Diagnosis not present

## 2015-06-04 DIAGNOSIS — R079 Chest pain, unspecified: Secondary | ICD-10-CM | POA: Diagnosis not present

## 2015-06-07 DIAGNOSIS — T148 Other injury of unspecified body region: Secondary | ICD-10-CM | POA: Diagnosis not present

## 2015-06-08 DIAGNOSIS — F39 Unspecified mood [affective] disorder: Secondary | ICD-10-CM | POA: Diagnosis not present

## 2015-06-08 DIAGNOSIS — J45909 Unspecified asthma, uncomplicated: Secondary | ICD-10-CM | POA: Diagnosis not present

## 2015-06-08 DIAGNOSIS — I340 Nonrheumatic mitral (valve) insufficiency: Secondary | ICD-10-CM | POA: Diagnosis not present

## 2015-06-08 DIAGNOSIS — G8929 Other chronic pain: Secondary | ICD-10-CM | POA: Diagnosis not present

## 2015-06-08 DIAGNOSIS — E119 Type 2 diabetes mellitus without complications: Secondary | ICD-10-CM

## 2015-06-08 DIAGNOSIS — R55 Syncope and collapse: Secondary | ICD-10-CM | POA: Diagnosis not present

## 2015-06-08 DIAGNOSIS — R000 Tachycardia, unspecified: Secondary | ICD-10-CM | POA: Diagnosis not present

## 2015-06-08 DIAGNOSIS — I10 Essential (primary) hypertension: Secondary | ICD-10-CM | POA: Diagnosis not present

## 2015-06-08 DIAGNOSIS — G894 Chronic pain syndrome: Secondary | ICD-10-CM | POA: Diagnosis not present

## 2015-06-08 DIAGNOSIS — R42 Dizziness and giddiness: Secondary | ICD-10-CM | POA: Diagnosis not present

## 2015-06-08 DIAGNOSIS — M6281 Muscle weakness (generalized): Secondary | ICD-10-CM | POA: Diagnosis not present

## 2015-06-08 DIAGNOSIS — I160 Hypertensive urgency: Secondary | ICD-10-CM | POA: Diagnosis not present

## 2015-06-08 DIAGNOSIS — R450 Nervousness: Secondary | ICD-10-CM | POA: Diagnosis not present

## 2015-06-08 DIAGNOSIS — G43001 Migraine without aura, not intractable, with status migrainosus: Secondary | ICD-10-CM | POA: Diagnosis not present

## 2015-06-08 DIAGNOSIS — R079 Chest pain, unspecified: Secondary | ICD-10-CM | POA: Diagnosis not present

## 2015-06-08 DIAGNOSIS — G935 Compression of brain: Secondary | ICD-10-CM | POA: Diagnosis not present

## 2015-06-08 DIAGNOSIS — R29810 Facial weakness: Secondary | ICD-10-CM | POA: Diagnosis not present

## 2015-06-08 DIAGNOSIS — F329 Major depressive disorder, single episode, unspecified: Secondary | ICD-10-CM | POA: Diagnosis not present

## 2015-06-08 HISTORY — DX: Type 2 diabetes mellitus without complications: E11.9

## 2015-06-09 DIAGNOSIS — E1142 Type 2 diabetes mellitus with diabetic polyneuropathy: Secondary | ICD-10-CM | POA: Diagnosis not present

## 2015-06-09 DIAGNOSIS — J4520 Mild intermittent asthma, uncomplicated: Secondary | ICD-10-CM | POA: Diagnosis not present

## 2015-06-09 DIAGNOSIS — G894 Chronic pain syndrome: Secondary | ICD-10-CM | POA: Diagnosis not present

## 2015-06-09 DIAGNOSIS — G935 Compression of brain: Secondary | ICD-10-CM | POA: Diagnosis not present

## 2015-06-09 DIAGNOSIS — R42 Dizziness and giddiness: Secondary | ICD-10-CM | POA: Diagnosis not present

## 2015-06-09 DIAGNOSIS — R55 Syncope and collapse: Secondary | ICD-10-CM | POA: Diagnosis not present

## 2015-06-09 DIAGNOSIS — F450 Somatization disorder: Secondary | ICD-10-CM | POA: Diagnosis not present

## 2015-06-09 DIAGNOSIS — M47816 Spondylosis without myelopathy or radiculopathy, lumbar region: Secondary | ICD-10-CM | POA: Diagnosis not present

## 2015-06-14 DIAGNOSIS — R609 Edema, unspecified: Secondary | ICD-10-CM | POA: Diagnosis not present

## 2015-06-14 DIAGNOSIS — M545 Low back pain: Secondary | ICD-10-CM | POA: Diagnosis not present

## 2015-06-14 DIAGNOSIS — R600 Localized edema: Secondary | ICD-10-CM | POA: Diagnosis not present

## 2015-06-14 DIAGNOSIS — Z765 Malingerer [conscious simulation]: Secondary | ICD-10-CM | POA: Diagnosis not present

## 2015-06-14 DIAGNOSIS — R079 Chest pain, unspecified: Secondary | ICD-10-CM | POA: Diagnosis not present

## 2015-06-17 DIAGNOSIS — J329 Chronic sinusitis, unspecified: Secondary | ICD-10-CM | POA: Diagnosis not present

## 2015-06-17 DIAGNOSIS — J40 Bronchitis, not specified as acute or chronic: Secondary | ICD-10-CM | POA: Diagnosis not present

## 2015-06-23 DIAGNOSIS — G894 Chronic pain syndrome: Secondary | ICD-10-CM | POA: Diagnosis not present

## 2015-06-23 DIAGNOSIS — I10 Essential (primary) hypertension: Secondary | ICD-10-CM | POA: Diagnosis not present

## 2015-06-23 DIAGNOSIS — J45998 Other asthma: Secondary | ICD-10-CM | POA: Diagnosis not present

## 2015-06-23 DIAGNOSIS — E119 Type 2 diabetes mellitus without complications: Secondary | ICD-10-CM | POA: Diagnosis not present

## 2015-06-30 DIAGNOSIS — R5381 Other malaise: Secondary | ICD-10-CM | POA: Diagnosis not present

## 2015-06-30 DIAGNOSIS — E1165 Type 2 diabetes mellitus with hyperglycemia: Secondary | ICD-10-CM | POA: Diagnosis not present

## 2015-06-30 DIAGNOSIS — I10 Essential (primary) hypertension: Secondary | ICD-10-CM | POA: Diagnosis not present

## 2015-06-30 DIAGNOSIS — R5383 Other fatigue: Secondary | ICD-10-CM | POA: Diagnosis not present

## 2015-07-01 DIAGNOSIS — D649 Anemia, unspecified: Secondary | ICD-10-CM | POA: Diagnosis not present

## 2015-07-01 DIAGNOSIS — R5381 Other malaise: Secondary | ICD-10-CM | POA: Diagnosis not present

## 2015-07-02 DIAGNOSIS — D72829 Elevated white blood cell count, unspecified: Secondary | ICD-10-CM | POA: Diagnosis not present

## 2015-07-02 DIAGNOSIS — F319 Bipolar disorder, unspecified: Secondary | ICD-10-CM | POA: Diagnosis not present

## 2015-07-02 DIAGNOSIS — E119 Type 2 diabetes mellitus without complications: Secondary | ICD-10-CM | POA: Diagnosis not present

## 2015-07-02 DIAGNOSIS — R6883 Chills (without fever): Secondary | ICD-10-CM | POA: Diagnosis not present

## 2015-07-02 DIAGNOSIS — R0602 Shortness of breath: Secondary | ICD-10-CM | POA: Diagnosis not present

## 2015-07-02 DIAGNOSIS — Z7984 Long term (current) use of oral hypoglycemic drugs: Secondary | ICD-10-CM | POA: Diagnosis not present

## 2015-07-02 DIAGNOSIS — R112 Nausea with vomiting, unspecified: Secondary | ICD-10-CM | POA: Diagnosis not present

## 2015-07-02 DIAGNOSIS — R079 Chest pain, unspecified: Secondary | ICD-10-CM

## 2015-07-02 DIAGNOSIS — I10 Essential (primary) hypertension: Secondary | ICD-10-CM | POA: Diagnosis not present

## 2015-07-02 DIAGNOSIS — R072 Precordial pain: Secondary | ICD-10-CM | POA: Diagnosis not present

## 2015-07-02 DIAGNOSIS — F419 Anxiety disorder, unspecified: Secondary | ICD-10-CM | POA: Diagnosis not present

## 2015-07-02 HISTORY — DX: Chest pain, unspecified: R07.9

## 2015-07-12 DIAGNOSIS — R072 Precordial pain: Secondary | ICD-10-CM | POA: Diagnosis not present

## 2015-07-12 DIAGNOSIS — R079 Chest pain, unspecified: Secondary | ICD-10-CM | POA: Diagnosis not present

## 2015-07-15 DIAGNOSIS — R102 Pelvic and perineal pain: Secondary | ICD-10-CM | POA: Diagnosis not present

## 2015-07-15 DIAGNOSIS — N912 Amenorrhea, unspecified: Secondary | ICD-10-CM | POA: Diagnosis not present

## 2015-07-23 DIAGNOSIS — J45998 Other asthma: Secondary | ICD-10-CM | POA: Diagnosis not present

## 2015-07-23 DIAGNOSIS — I10 Essential (primary) hypertension: Secondary | ICD-10-CM | POA: Diagnosis not present

## 2015-07-23 DIAGNOSIS — G894 Chronic pain syndrome: Secondary | ICD-10-CM | POA: Diagnosis not present

## 2015-07-23 DIAGNOSIS — E119 Type 2 diabetes mellitus without complications: Secondary | ICD-10-CM | POA: Diagnosis not present

## 2015-07-23 DIAGNOSIS — J4540 Moderate persistent asthma, uncomplicated: Secondary | ICD-10-CM | POA: Diagnosis not present

## 2015-07-24 DIAGNOSIS — R0789 Other chest pain: Secondary | ICD-10-CM | POA: Diagnosis not present

## 2015-07-24 DIAGNOSIS — R079 Chest pain, unspecified: Secondary | ICD-10-CM | POA: Diagnosis not present

## 2015-07-24 DIAGNOSIS — R109 Unspecified abdominal pain: Secondary | ICD-10-CM | POA: Diagnosis not present

## 2015-07-25 DIAGNOSIS — R079 Chest pain, unspecified: Secondary | ICD-10-CM | POA: Diagnosis not present

## 2015-08-03 DIAGNOSIS — K219 Gastro-esophageal reflux disease without esophagitis: Secondary | ICD-10-CM | POA: Diagnosis not present

## 2015-08-03 DIAGNOSIS — R930 Abnormal findings on diagnostic imaging of skull and head, not elsewhere classified: Secondary | ICD-10-CM | POA: Diagnosis not present

## 2015-08-03 DIAGNOSIS — I10 Essential (primary) hypertension: Secondary | ICD-10-CM | POA: Diagnosis not present

## 2015-08-03 DIAGNOSIS — J189 Pneumonia, unspecified organism: Secondary | ICD-10-CM | POA: Diagnosis not present

## 2015-08-03 DIAGNOSIS — R109 Unspecified abdominal pain: Secondary | ICD-10-CM | POA: Diagnosis not present

## 2015-08-03 DIAGNOSIS — M6289 Other specified disorders of muscle: Secondary | ICD-10-CM | POA: Diagnosis not present

## 2015-08-03 DIAGNOSIS — E119 Type 2 diabetes mellitus without complications: Secondary | ICD-10-CM | POA: Diagnosis not present

## 2015-08-03 DIAGNOSIS — F449 Dissociative and conversion disorder, unspecified: Secondary | ICD-10-CM | POA: Diagnosis not present

## 2015-08-03 DIAGNOSIS — R0789 Other chest pain: Secondary | ICD-10-CM | POA: Diagnosis not present

## 2015-08-03 DIAGNOSIS — G43909 Migraine, unspecified, not intractable, without status migrainosus: Secondary | ICD-10-CM | POA: Diagnosis not present

## 2015-08-03 DIAGNOSIS — N200 Calculus of kidney: Secondary | ICD-10-CM | POA: Diagnosis not present

## 2015-08-03 DIAGNOSIS — R51 Headache: Secondary | ICD-10-CM | POA: Diagnosis not present

## 2015-08-03 DIAGNOSIS — H5442 Blindness, left eye, normal vision right eye: Secondary | ICD-10-CM | POA: Diagnosis not present

## 2015-08-03 DIAGNOSIS — R05 Cough: Secondary | ICD-10-CM | POA: Diagnosis not present

## 2015-08-03 DIAGNOSIS — R1013 Epigastric pain: Secondary | ICD-10-CM | POA: Diagnosis not present

## 2015-08-03 DIAGNOSIS — F444 Conversion disorder with motor symptom or deficit: Secondary | ICD-10-CM | POA: Diagnosis not present

## 2015-08-03 DIAGNOSIS — R072 Precordial pain: Secondary | ICD-10-CM | POA: Diagnosis not present

## 2015-08-03 DIAGNOSIS — E1165 Type 2 diabetes mellitus with hyperglycemia: Secondary | ICD-10-CM | POA: Diagnosis not present

## 2015-08-03 DIAGNOSIS — K920 Hematemesis: Secondary | ICD-10-CM | POA: Diagnosis not present

## 2015-08-03 DIAGNOSIS — R531 Weakness: Secondary | ICD-10-CM | POA: Diagnosis not present

## 2015-08-03 DIAGNOSIS — H5710 Ocular pain, unspecified eye: Secondary | ICD-10-CM | POA: Diagnosis not present

## 2015-08-03 DIAGNOSIS — H53122 Transient visual loss, left eye: Secondary | ICD-10-CM | POA: Diagnosis not present

## 2015-08-03 DIAGNOSIS — F314 Bipolar disorder, current episode depressed, severe, without psychotic features: Secondary | ICD-10-CM | POA: Diagnosis not present

## 2015-08-03 DIAGNOSIS — M4716 Other spondylosis with myelopathy, lumbar region: Secondary | ICD-10-CM | POA: Diagnosis not present

## 2015-08-03 DIAGNOSIS — K2950 Unspecified chronic gastritis without bleeding: Secondary | ICD-10-CM | POA: Diagnosis not present

## 2015-08-03 DIAGNOSIS — K2970 Gastritis, unspecified, without bleeding: Secondary | ICD-10-CM | POA: Diagnosis not present

## 2015-08-03 DIAGNOSIS — N319 Neuromuscular dysfunction of bladder, unspecified: Secondary | ICD-10-CM | POA: Diagnosis not present

## 2015-08-03 DIAGNOSIS — F329 Major depressive disorder, single episode, unspecified: Secondary | ICD-10-CM | POA: Diagnosis not present

## 2015-08-03 DIAGNOSIS — E118 Type 2 diabetes mellitus with unspecified complications: Secondary | ICD-10-CM | POA: Diagnosis not present

## 2015-08-03 DIAGNOSIS — H540 Blindness, both eyes: Secondary | ICD-10-CM | POA: Diagnosis not present

## 2015-08-03 DIAGNOSIS — I6789 Other cerebrovascular disease: Secondary | ICD-10-CM | POA: Diagnosis not present

## 2015-08-03 DIAGNOSIS — R933 Abnormal findings on diagnostic imaging of other parts of digestive tract: Secondary | ICD-10-CM | POA: Diagnosis not present

## 2015-08-03 DIAGNOSIS — J45909 Unspecified asthma, uncomplicated: Secondary | ICD-10-CM | POA: Diagnosis not present

## 2015-08-03 DIAGNOSIS — R000 Tachycardia, unspecified: Secondary | ICD-10-CM | POA: Diagnosis not present

## 2015-08-03 DIAGNOSIS — Z9889 Other specified postprocedural states: Secondary | ICD-10-CM | POA: Diagnosis not present

## 2015-08-03 DIAGNOSIS — F332 Major depressive disorder, recurrent severe without psychotic features: Secondary | ICD-10-CM | POA: Diagnosis not present

## 2015-08-03 DIAGNOSIS — R52 Pain, unspecified: Secondary | ICD-10-CM | POA: Diagnosis not present

## 2015-08-03 DIAGNOSIS — R1031 Right lower quadrant pain: Secondary | ICD-10-CM | POA: Diagnosis not present

## 2015-08-03 DIAGNOSIS — R079 Chest pain, unspecified: Secondary | ICD-10-CM | POA: Diagnosis not present

## 2015-08-03 DIAGNOSIS — G4700 Insomnia, unspecified: Secondary | ICD-10-CM | POA: Diagnosis not present

## 2015-08-03 DIAGNOSIS — G8191 Hemiplegia, unspecified affecting right dominant side: Secondary | ICD-10-CM | POA: Diagnosis not present

## 2015-08-03 DIAGNOSIS — F450 Somatization disorder: Secondary | ICD-10-CM | POA: Diagnosis not present

## 2015-08-04 DIAGNOSIS — E119 Type 2 diabetes mellitus without complications: Secondary | ICD-10-CM | POA: Diagnosis not present

## 2015-08-04 DIAGNOSIS — G8191 Hemiplegia, unspecified affecting right dominant side: Secondary | ICD-10-CM | POA: Diagnosis not present

## 2015-08-04 DIAGNOSIS — R079 Chest pain, unspecified: Secondary | ICD-10-CM | POA: Diagnosis not present

## 2015-08-04 DIAGNOSIS — F450 Somatization disorder: Secondary | ICD-10-CM | POA: Diagnosis not present

## 2015-08-04 DIAGNOSIS — F449 Dissociative and conversion disorder, unspecified: Secondary | ICD-10-CM | POA: Diagnosis not present

## 2015-08-04 DIAGNOSIS — F332 Major depressive disorder, recurrent severe without psychotic features: Secondary | ICD-10-CM | POA: Diagnosis not present

## 2015-08-04 DIAGNOSIS — N200 Calculus of kidney: Secondary | ICD-10-CM | POA: Diagnosis not present

## 2015-08-04 DIAGNOSIS — R1031 Right lower quadrant pain: Secondary | ICD-10-CM | POA: Diagnosis not present

## 2015-08-04 DIAGNOSIS — I10 Essential (primary) hypertension: Secondary | ICD-10-CM | POA: Diagnosis not present

## 2015-08-04 DIAGNOSIS — R531 Weakness: Secondary | ICD-10-CM | POA: Insufficient documentation

## 2015-08-04 DIAGNOSIS — M6289 Other specified disorders of muscle: Secondary | ICD-10-CM | POA: Diagnosis not present

## 2015-08-05 DIAGNOSIS — F450 Somatization disorder: Secondary | ICD-10-CM | POA: Diagnosis not present

## 2015-08-05 DIAGNOSIS — R000 Tachycardia, unspecified: Secondary | ICD-10-CM | POA: Diagnosis not present

## 2015-08-05 DIAGNOSIS — E119 Type 2 diabetes mellitus without complications: Secondary | ICD-10-CM | POA: Diagnosis not present

## 2015-08-05 DIAGNOSIS — R531 Weakness: Secondary | ICD-10-CM | POA: Diagnosis not present

## 2015-08-05 DIAGNOSIS — M6289 Other specified disorders of muscle: Secondary | ICD-10-CM | POA: Diagnosis not present

## 2015-08-05 DIAGNOSIS — I10 Essential (primary) hypertension: Secondary | ICD-10-CM | POA: Diagnosis not present

## 2015-08-05 DIAGNOSIS — J45909 Unspecified asthma, uncomplicated: Secondary | ICD-10-CM | POA: Diagnosis not present

## 2015-08-06 DIAGNOSIS — R531 Weakness: Secondary | ICD-10-CM | POA: Diagnosis not present

## 2015-08-06 DIAGNOSIS — J45909 Unspecified asthma, uncomplicated: Secondary | ICD-10-CM | POA: Diagnosis not present

## 2015-08-06 DIAGNOSIS — I10 Essential (primary) hypertension: Secondary | ICD-10-CM | POA: Diagnosis not present

## 2015-08-06 DIAGNOSIS — E119 Type 2 diabetes mellitus without complications: Secondary | ICD-10-CM | POA: Diagnosis not present

## 2015-08-07 DIAGNOSIS — E119 Type 2 diabetes mellitus without complications: Secondary | ICD-10-CM | POA: Diagnosis not present

## 2015-08-07 DIAGNOSIS — R109 Unspecified abdominal pain: Secondary | ICD-10-CM | POA: Diagnosis not present

## 2015-08-07 DIAGNOSIS — J45909 Unspecified asthma, uncomplicated: Secondary | ICD-10-CM | POA: Diagnosis not present

## 2015-08-07 DIAGNOSIS — I10 Essential (primary) hypertension: Secondary | ICD-10-CM | POA: Diagnosis not present

## 2015-08-07 DIAGNOSIS — R531 Weakness: Secondary | ICD-10-CM | POA: Diagnosis not present

## 2015-08-07 DIAGNOSIS — F329 Major depressive disorder, single episode, unspecified: Secondary | ICD-10-CM | POA: Diagnosis not present

## 2015-08-08 DIAGNOSIS — J45909 Unspecified asthma, uncomplicated: Secondary | ICD-10-CM | POA: Diagnosis not present

## 2015-08-08 DIAGNOSIS — E119 Type 2 diabetes mellitus without complications: Secondary | ICD-10-CM | POA: Diagnosis not present

## 2015-08-08 DIAGNOSIS — R000 Tachycardia, unspecified: Secondary | ICD-10-CM | POA: Diagnosis not present

## 2015-08-08 DIAGNOSIS — R05 Cough: Secondary | ICD-10-CM | POA: Diagnosis not present

## 2015-08-08 DIAGNOSIS — R109 Unspecified abdominal pain: Secondary | ICD-10-CM | POA: Diagnosis not present

## 2015-08-08 DIAGNOSIS — I10 Essential (primary) hypertension: Secondary | ICD-10-CM | POA: Diagnosis not present

## 2015-08-08 DIAGNOSIS — R531 Weakness: Secondary | ICD-10-CM | POA: Diagnosis not present

## 2015-08-09 DIAGNOSIS — F449 Dissociative and conversion disorder, unspecified: Secondary | ICD-10-CM | POA: Diagnosis not present

## 2015-08-15 DIAGNOSIS — H53122 Transient visual loss, left eye: Secondary | ICD-10-CM | POA: Insufficient documentation

## 2015-08-17 DIAGNOSIS — E1165 Type 2 diabetes mellitus with hyperglycemia: Secondary | ICD-10-CM | POA: Diagnosis not present

## 2015-08-17 DIAGNOSIS — I10 Essential (primary) hypertension: Secondary | ICD-10-CM | POA: Diagnosis not present

## 2015-08-17 DIAGNOSIS — H540 Blindness, both eyes: Secondary | ICD-10-CM | POA: Diagnosis not present

## 2015-08-17 DIAGNOSIS — F314 Bipolar disorder, current episode depressed, severe, without psychotic features: Secondary | ICD-10-CM | POA: Diagnosis not present

## 2015-08-17 DIAGNOSIS — G4700 Insomnia, unspecified: Secondary | ICD-10-CM | POA: Diagnosis not present

## 2015-08-17 DIAGNOSIS — K219 Gastro-esophageal reflux disease without esophagitis: Secondary | ICD-10-CM | POA: Diagnosis not present

## 2015-08-17 DIAGNOSIS — G8191 Hemiplegia, unspecified affecting right dominant side: Secondary | ICD-10-CM | POA: Diagnosis not present

## 2015-08-17 DIAGNOSIS — G43909 Migraine, unspecified, not intractable, without status migrainosus: Secondary | ICD-10-CM | POA: Diagnosis not present

## 2015-08-17 DIAGNOSIS — R0789 Other chest pain: Secondary | ICD-10-CM | POA: Diagnosis not present

## 2015-08-18 DIAGNOSIS — F329 Major depressive disorder, single episode, unspecified: Secondary | ICD-10-CM | POA: Diagnosis not present

## 2015-08-18 DIAGNOSIS — R0789 Other chest pain: Secondary | ICD-10-CM | POA: Diagnosis not present

## 2015-08-18 DIAGNOSIS — Z9889 Other specified postprocedural states: Secondary | ICD-10-CM | POA: Diagnosis not present

## 2015-08-18 DIAGNOSIS — I10 Essential (primary) hypertension: Secondary | ICD-10-CM | POA: Diagnosis not present

## 2015-08-18 DIAGNOSIS — R51 Headache: Secondary | ICD-10-CM | POA: Diagnosis not present

## 2015-08-18 DIAGNOSIS — K219 Gastro-esophageal reflux disease without esophagitis: Secondary | ICD-10-CM | POA: Diagnosis not present

## 2015-08-18 DIAGNOSIS — G8191 Hemiplegia, unspecified affecting right dominant side: Secondary | ICD-10-CM | POA: Diagnosis not present

## 2015-08-18 DIAGNOSIS — E1165 Type 2 diabetes mellitus with hyperglycemia: Secondary | ICD-10-CM | POA: Diagnosis not present

## 2015-08-18 DIAGNOSIS — G43909 Migraine, unspecified, not intractable, without status migrainosus: Secondary | ICD-10-CM | POA: Diagnosis not present

## 2015-08-18 DIAGNOSIS — G4700 Insomnia, unspecified: Secondary | ICD-10-CM | POA: Diagnosis not present

## 2015-08-18 DIAGNOSIS — F314 Bipolar disorder, current episode depressed, severe, without psychotic features: Secondary | ICD-10-CM | POA: Diagnosis not present

## 2015-08-18 DIAGNOSIS — H540 Blindness, both eyes: Secondary | ICD-10-CM | POA: Diagnosis not present

## 2015-08-18 DIAGNOSIS — R531 Weakness: Secondary | ICD-10-CM | POA: Diagnosis not present

## 2015-08-19 DIAGNOSIS — F329 Major depressive disorder, single episode, unspecified: Secondary | ICD-10-CM | POA: Diagnosis not present

## 2015-08-19 DIAGNOSIS — G8191 Hemiplegia, unspecified affecting right dominant side: Secondary | ICD-10-CM | POA: Diagnosis not present

## 2015-08-19 DIAGNOSIS — G4700 Insomnia, unspecified: Secondary | ICD-10-CM | POA: Diagnosis not present

## 2015-08-19 DIAGNOSIS — H540 Blindness, both eyes: Secondary | ICD-10-CM | POA: Diagnosis not present

## 2015-08-19 DIAGNOSIS — R51 Headache: Secondary | ICD-10-CM | POA: Diagnosis not present

## 2015-08-19 DIAGNOSIS — I10 Essential (primary) hypertension: Secondary | ICD-10-CM | POA: Diagnosis not present

## 2015-08-19 DIAGNOSIS — K219 Gastro-esophageal reflux disease without esophagitis: Secondary | ICD-10-CM | POA: Diagnosis not present

## 2015-08-19 DIAGNOSIS — I4581 Long QT syndrome: Secondary | ICD-10-CM | POA: Diagnosis not present

## 2015-08-19 DIAGNOSIS — R531 Weakness: Secondary | ICD-10-CM | POA: Diagnosis not present

## 2015-08-19 DIAGNOSIS — Z9889 Other specified postprocedural states: Secondary | ICD-10-CM | POA: Diagnosis not present

## 2015-08-19 DIAGNOSIS — R0789 Other chest pain: Secondary | ICD-10-CM | POA: Diagnosis not present

## 2015-08-19 DIAGNOSIS — F314 Bipolar disorder, current episode depressed, severe, without psychotic features: Secondary | ICD-10-CM | POA: Diagnosis not present

## 2015-08-19 DIAGNOSIS — E1165 Type 2 diabetes mellitus with hyperglycemia: Secondary | ICD-10-CM | POA: Diagnosis not present

## 2015-08-19 DIAGNOSIS — G43909 Migraine, unspecified, not intractable, without status migrainosus: Secondary | ICD-10-CM | POA: Diagnosis not present

## 2015-08-20 DIAGNOSIS — Z9889 Other specified postprocedural states: Secondary | ICD-10-CM | POA: Diagnosis not present

## 2015-08-20 DIAGNOSIS — K219 Gastro-esophageal reflux disease without esophagitis: Secondary | ICD-10-CM | POA: Diagnosis not present

## 2015-08-20 DIAGNOSIS — I10 Essential (primary) hypertension: Secondary | ICD-10-CM | POA: Diagnosis not present

## 2015-08-20 DIAGNOSIS — F329 Major depressive disorder, single episode, unspecified: Secondary | ICD-10-CM | POA: Diagnosis not present

## 2015-08-20 DIAGNOSIS — R51 Headache: Secondary | ICD-10-CM | POA: Diagnosis not present

## 2015-08-20 DIAGNOSIS — R0789 Other chest pain: Secondary | ICD-10-CM | POA: Diagnosis not present

## 2015-08-20 DIAGNOSIS — E1165 Type 2 diabetes mellitus with hyperglycemia: Secondary | ICD-10-CM | POA: Diagnosis not present

## 2015-08-20 DIAGNOSIS — H540 Blindness, both eyes: Secondary | ICD-10-CM | POA: Diagnosis not present

## 2015-08-20 DIAGNOSIS — G8191 Hemiplegia, unspecified affecting right dominant side: Secondary | ICD-10-CM | POA: Diagnosis not present

## 2015-08-20 DIAGNOSIS — R531 Weakness: Secondary | ICD-10-CM | POA: Diagnosis not present

## 2015-08-20 DIAGNOSIS — F314 Bipolar disorder, current episode depressed, severe, without psychotic features: Secondary | ICD-10-CM | POA: Diagnosis not present

## 2015-08-20 DIAGNOSIS — G4700 Insomnia, unspecified: Secondary | ICD-10-CM | POA: Diagnosis not present

## 2015-08-20 DIAGNOSIS — G43909 Migraine, unspecified, not intractable, without status migrainosus: Secondary | ICD-10-CM | POA: Diagnosis not present

## 2015-08-21 DIAGNOSIS — R51 Headache: Secondary | ICD-10-CM | POA: Diagnosis not present

## 2015-08-21 DIAGNOSIS — G4700 Insomnia, unspecified: Secondary | ICD-10-CM | POA: Diagnosis not present

## 2015-08-21 DIAGNOSIS — F314 Bipolar disorder, current episode depressed, severe, without psychotic features: Secondary | ICD-10-CM | POA: Diagnosis not present

## 2015-08-21 DIAGNOSIS — F329 Major depressive disorder, single episode, unspecified: Secondary | ICD-10-CM | POA: Diagnosis not present

## 2015-08-21 DIAGNOSIS — R0789 Other chest pain: Secondary | ICD-10-CM | POA: Diagnosis not present

## 2015-08-21 DIAGNOSIS — Z9889 Other specified postprocedural states: Secondary | ICD-10-CM | POA: Diagnosis not present

## 2015-08-21 DIAGNOSIS — H540 Blindness, both eyes: Secondary | ICD-10-CM | POA: Diagnosis not present

## 2015-08-21 DIAGNOSIS — G43909 Migraine, unspecified, not intractable, without status migrainosus: Secondary | ICD-10-CM | POA: Diagnosis not present

## 2015-08-21 DIAGNOSIS — I4581 Long QT syndrome: Secondary | ICD-10-CM | POA: Diagnosis not present

## 2015-08-21 DIAGNOSIS — K219 Gastro-esophageal reflux disease without esophagitis: Secondary | ICD-10-CM | POA: Diagnosis not present

## 2015-08-21 DIAGNOSIS — R531 Weakness: Secondary | ICD-10-CM | POA: Diagnosis not present

## 2015-08-21 DIAGNOSIS — I10 Essential (primary) hypertension: Secondary | ICD-10-CM | POA: Diagnosis not present

## 2015-08-21 DIAGNOSIS — E1165 Type 2 diabetes mellitus with hyperglycemia: Secondary | ICD-10-CM | POA: Diagnosis not present

## 2015-08-21 DIAGNOSIS — G8191 Hemiplegia, unspecified affecting right dominant side: Secondary | ICD-10-CM | POA: Diagnosis not present

## 2015-08-22 DIAGNOSIS — E1165 Type 2 diabetes mellitus with hyperglycemia: Secondary | ICD-10-CM | POA: Diagnosis not present

## 2015-08-22 DIAGNOSIS — I440 Atrioventricular block, first degree: Secondary | ICD-10-CM | POA: Diagnosis not present

## 2015-08-22 DIAGNOSIS — R51 Headache: Secondary | ICD-10-CM | POA: Diagnosis not present

## 2015-08-22 DIAGNOSIS — K219 Gastro-esophageal reflux disease without esophagitis: Secondary | ICD-10-CM | POA: Diagnosis not present

## 2015-08-22 DIAGNOSIS — I10 Essential (primary) hypertension: Secondary | ICD-10-CM | POA: Diagnosis not present

## 2015-08-22 DIAGNOSIS — Z9889 Other specified postprocedural states: Secondary | ICD-10-CM | POA: Diagnosis not present

## 2015-08-22 DIAGNOSIS — R0789 Other chest pain: Secondary | ICD-10-CM | POA: Diagnosis not present

## 2015-08-22 DIAGNOSIS — R531 Weakness: Secondary | ICD-10-CM | POA: Diagnosis not present

## 2015-08-22 DIAGNOSIS — G43909 Migraine, unspecified, not intractable, without status migrainosus: Secondary | ICD-10-CM | POA: Diagnosis not present

## 2015-08-22 DIAGNOSIS — G8191 Hemiplegia, unspecified affecting right dominant side: Secondary | ICD-10-CM | POA: Diagnosis not present

## 2015-08-22 DIAGNOSIS — H540 Blindness, both eyes: Secondary | ICD-10-CM | POA: Diagnosis not present

## 2015-08-22 DIAGNOSIS — G4700 Insomnia, unspecified: Secondary | ICD-10-CM | POA: Diagnosis not present

## 2015-08-22 DIAGNOSIS — F329 Major depressive disorder, single episode, unspecified: Secondary | ICD-10-CM | POA: Diagnosis not present

## 2015-08-22 DIAGNOSIS — F314 Bipolar disorder, current episode depressed, severe, without psychotic features: Secondary | ICD-10-CM | POA: Diagnosis not present

## 2015-08-22 DIAGNOSIS — J4540 Moderate persistent asthma, uncomplicated: Secondary | ICD-10-CM | POA: Diagnosis not present

## 2015-08-23 DIAGNOSIS — F329 Major depressive disorder, single episode, unspecified: Secondary | ICD-10-CM | POA: Diagnosis not present

## 2015-08-23 DIAGNOSIS — G43909 Migraine, unspecified, not intractable, without status migrainosus: Secondary | ICD-10-CM | POA: Diagnosis not present

## 2015-08-23 DIAGNOSIS — H53133 Sudden visual loss, bilateral: Secondary | ICD-10-CM | POA: Diagnosis not present

## 2015-08-23 DIAGNOSIS — R0789 Other chest pain: Secondary | ICD-10-CM | POA: Diagnosis not present

## 2015-08-23 DIAGNOSIS — I10 Essential (primary) hypertension: Secondary | ICD-10-CM | POA: Diagnosis not present

## 2015-08-23 DIAGNOSIS — E1165 Type 2 diabetes mellitus with hyperglycemia: Secondary | ICD-10-CM | POA: Diagnosis not present

## 2015-08-23 DIAGNOSIS — R51 Headache: Secondary | ICD-10-CM | POA: Diagnosis not present

## 2015-08-23 DIAGNOSIS — F314 Bipolar disorder, current episode depressed, severe, without psychotic features: Secondary | ICD-10-CM | POA: Diagnosis not present

## 2015-08-23 DIAGNOSIS — H540 Blindness, both eyes: Secondary | ICD-10-CM | POA: Diagnosis not present

## 2015-08-23 DIAGNOSIS — F411 Generalized anxiety disorder: Secondary | ICD-10-CM | POA: Diagnosis not present

## 2015-08-23 DIAGNOSIS — G8191 Hemiplegia, unspecified affecting right dominant side: Secondary | ICD-10-CM | POA: Diagnosis not present

## 2015-08-23 DIAGNOSIS — G4700 Insomnia, unspecified: Secondary | ICD-10-CM | POA: Diagnosis not present

## 2015-08-23 DIAGNOSIS — J45998 Other asthma: Secondary | ICD-10-CM | POA: Diagnosis not present

## 2015-08-23 DIAGNOSIS — F449 Dissociative and conversion disorder, unspecified: Secondary | ICD-10-CM | POA: Diagnosis not present

## 2015-08-23 DIAGNOSIS — E119 Type 2 diabetes mellitus without complications: Secondary | ICD-10-CM | POA: Diagnosis not present

## 2015-08-23 DIAGNOSIS — Z9889 Other specified postprocedural states: Secondary | ICD-10-CM | POA: Diagnosis not present

## 2015-08-23 DIAGNOSIS — K219 Gastro-esophageal reflux disease without esophagitis: Secondary | ICD-10-CM | POA: Diagnosis not present

## 2015-08-23 DIAGNOSIS — G894 Chronic pain syndrome: Secondary | ICD-10-CM | POA: Diagnosis not present

## 2015-08-23 DIAGNOSIS — F444 Conversion disorder with motor symptom or deficit: Secondary | ICD-10-CM | POA: Insufficient documentation

## 2015-08-23 DIAGNOSIS — Z7409 Other reduced mobility: Secondary | ICD-10-CM | POA: Diagnosis not present

## 2015-08-23 HISTORY — DX: Conversion disorder with motor symptom or deficit: F44.4

## 2015-09-02 DIAGNOSIS — F449 Dissociative and conversion disorder, unspecified: Secondary | ICD-10-CM | POA: Diagnosis not present

## 2015-09-02 DIAGNOSIS — I10 Essential (primary) hypertension: Secondary | ICD-10-CM | POA: Diagnosis not present

## 2015-09-02 DIAGNOSIS — E1165 Type 2 diabetes mellitus with hyperglycemia: Secondary | ICD-10-CM | POA: Diagnosis not present

## 2015-09-03 DIAGNOSIS — R079 Chest pain, unspecified: Secondary | ICD-10-CM | POA: Diagnosis not present

## 2015-09-03 DIAGNOSIS — R0602 Shortness of breath: Secondary | ICD-10-CM | POA: Diagnosis not present

## 2015-09-03 DIAGNOSIS — R0600 Dyspnea, unspecified: Secondary | ICD-10-CM | POA: Diagnosis not present

## 2015-09-03 DIAGNOSIS — J449 Chronic obstructive pulmonary disease, unspecified: Secondary | ICD-10-CM | POA: Diagnosis not present

## 2015-09-03 DIAGNOSIS — I10 Essential (primary) hypertension: Secondary | ICD-10-CM | POA: Diagnosis not present

## 2015-09-04 DIAGNOSIS — R0602 Shortness of breath: Secondary | ICD-10-CM | POA: Diagnosis not present

## 2015-09-04 DIAGNOSIS — R079 Chest pain, unspecified: Secondary | ICD-10-CM | POA: Diagnosis not present

## 2015-09-09 DIAGNOSIS — I10 Essential (primary) hypertension: Secondary | ICD-10-CM | POA: Diagnosis not present

## 2015-09-09 DIAGNOSIS — R1030 Lower abdominal pain, unspecified: Secondary | ICD-10-CM | POA: Diagnosis not present

## 2015-09-09 DIAGNOSIS — R404 Transient alteration of awareness: Secondary | ICD-10-CM | POA: Diagnosis not present

## 2015-09-09 DIAGNOSIS — Z880 Allergy status to penicillin: Secondary | ICD-10-CM | POA: Diagnosis not present

## 2015-09-09 DIAGNOSIS — R079 Chest pain, unspecified: Secondary | ICD-10-CM | POA: Diagnosis not present

## 2015-09-09 DIAGNOSIS — E119 Type 2 diabetes mellitus without complications: Secondary | ICD-10-CM | POA: Diagnosis not present

## 2015-09-09 DIAGNOSIS — Z9049 Acquired absence of other specified parts of digestive tract: Secondary | ICD-10-CM | POA: Diagnosis not present

## 2015-09-09 DIAGNOSIS — F319 Bipolar disorder, unspecified: Secondary | ICD-10-CM | POA: Diagnosis not present

## 2015-09-09 DIAGNOSIS — R55 Syncope and collapse: Secondary | ICD-10-CM | POA: Diagnosis not present

## 2015-09-09 DIAGNOSIS — R0789 Other chest pain: Secondary | ICD-10-CM | POA: Diagnosis not present

## 2015-09-09 DIAGNOSIS — R42 Dizziness and giddiness: Secondary | ICD-10-CM | POA: Diagnosis not present

## 2015-09-09 DIAGNOSIS — K5900 Constipation, unspecified: Secondary | ICD-10-CM | POA: Diagnosis not present

## 2015-09-09 DIAGNOSIS — R1084 Generalized abdominal pain: Secondary | ICD-10-CM | POA: Diagnosis not present

## 2015-09-14 DIAGNOSIS — F320 Major depressive disorder, single episode, mild: Secondary | ICD-10-CM | POA: Diagnosis not present

## 2015-09-14 DIAGNOSIS — F4310 Post-traumatic stress disorder, unspecified: Secondary | ICD-10-CM

## 2015-09-14 DIAGNOSIS — F449 Dissociative and conversion disorder, unspecified: Secondary | ICD-10-CM | POA: Diagnosis not present

## 2015-09-14 DIAGNOSIS — Z79899 Other long term (current) drug therapy: Secondary | ICD-10-CM | POA: Diagnosis not present

## 2015-09-14 DIAGNOSIS — F609 Personality disorder, unspecified: Secondary | ICD-10-CM | POA: Insufficient documentation

## 2015-09-14 DIAGNOSIS — F445 Conversion disorder with seizures or convulsions: Secondary | ICD-10-CM | POA: Insufficient documentation

## 2015-09-14 DIAGNOSIS — F450 Somatization disorder: Secondary | ICD-10-CM | POA: Diagnosis not present

## 2015-09-14 HISTORY — DX: Post-traumatic stress disorder, unspecified: F43.10

## 2015-09-14 HISTORY — DX: Conversion disorder with seizures or convulsions: F44.5

## 2015-09-15 DIAGNOSIS — H538 Other visual disturbances: Secondary | ICD-10-CM | POA: Diagnosis not present

## 2015-09-15 DIAGNOSIS — R0789 Other chest pain: Secondary | ICD-10-CM | POA: Diagnosis not present

## 2015-09-15 DIAGNOSIS — H53133 Sudden visual loss, bilateral: Secondary | ICD-10-CM | POA: Diagnosis not present

## 2015-09-15 DIAGNOSIS — R079 Chest pain, unspecified: Secondary | ICD-10-CM | POA: Diagnosis not present

## 2015-09-15 DIAGNOSIS — G8929 Other chronic pain: Secondary | ICD-10-CM | POA: Diagnosis not present

## 2015-09-16 DIAGNOSIS — R0789 Other chest pain: Secondary | ICD-10-CM | POA: Diagnosis not present

## 2015-09-16 DIAGNOSIS — G8929 Other chronic pain: Secondary | ICD-10-CM | POA: Diagnosis not present

## 2015-09-21 DIAGNOSIS — G43709 Chronic migraine without aura, not intractable, without status migrainosus: Secondary | ICD-10-CM | POA: Diagnosis not present

## 2015-09-21 DIAGNOSIS — M6289 Other specified disorders of muscle: Secondary | ICD-10-CM | POA: Diagnosis not present

## 2015-09-21 DIAGNOSIS — G253 Myoclonus: Secondary | ICD-10-CM | POA: Diagnosis not present

## 2015-09-21 DIAGNOSIS — G935 Compression of brain: Secondary | ICD-10-CM | POA: Diagnosis not present

## 2015-09-21 DIAGNOSIS — Z9181 History of falling: Secondary | ICD-10-CM | POA: Insufficient documentation

## 2015-09-22 DIAGNOSIS — J4540 Moderate persistent asthma, uncomplicated: Secondary | ICD-10-CM | POA: Diagnosis not present

## 2015-09-23 DIAGNOSIS — I10 Essential (primary) hypertension: Secondary | ICD-10-CM | POA: Diagnosis not present

## 2015-09-23 DIAGNOSIS — E119 Type 2 diabetes mellitus without complications: Secondary | ICD-10-CM | POA: Diagnosis not present

## 2015-09-23 DIAGNOSIS — J45998 Other asthma: Secondary | ICD-10-CM | POA: Diagnosis not present

## 2015-09-23 DIAGNOSIS — G894 Chronic pain syndrome: Secondary | ICD-10-CM | POA: Diagnosis not present

## 2015-10-09 DIAGNOSIS — R000 Tachycardia, unspecified: Secondary | ICD-10-CM | POA: Diagnosis not present

## 2015-10-09 DIAGNOSIS — F319 Bipolar disorder, unspecified: Secondary | ICD-10-CM | POA: Diagnosis not present

## 2015-10-09 DIAGNOSIS — R002 Palpitations: Secondary | ICD-10-CM | POA: Diagnosis not present

## 2015-10-09 DIAGNOSIS — G8929 Other chronic pain: Secondary | ICD-10-CM | POA: Diagnosis not present

## 2015-10-09 DIAGNOSIS — F449 Dissociative and conversion disorder, unspecified: Secondary | ICD-10-CM | POA: Diagnosis not present

## 2015-10-09 DIAGNOSIS — I440 Atrioventricular block, first degree: Secondary | ICD-10-CM | POA: Diagnosis not present

## 2015-10-09 DIAGNOSIS — R079 Chest pain, unspecified: Secondary | ICD-10-CM | POA: Diagnosis not present

## 2015-10-09 DIAGNOSIS — F4310 Post-traumatic stress disorder, unspecified: Secondary | ICD-10-CM | POA: Diagnosis not present

## 2015-10-09 DIAGNOSIS — E119 Type 2 diabetes mellitus without complications: Secondary | ICD-10-CM | POA: Diagnosis not present

## 2015-10-09 DIAGNOSIS — J45909 Unspecified asthma, uncomplicated: Secondary | ICD-10-CM | POA: Diagnosis not present

## 2015-10-09 DIAGNOSIS — R29818 Other symptoms and signs involving the nervous system: Secondary | ICD-10-CM | POA: Diagnosis not present

## 2015-10-09 DIAGNOSIS — F419 Anxiety disorder, unspecified: Secondary | ICD-10-CM | POA: Diagnosis not present

## 2015-10-09 DIAGNOSIS — R531 Weakness: Secondary | ICD-10-CM | POA: Diagnosis not present

## 2015-10-09 DIAGNOSIS — K219 Gastro-esophageal reflux disease without esophagitis: Secondary | ICD-10-CM | POA: Diagnosis not present

## 2015-10-09 DIAGNOSIS — F444 Conversion disorder with motor symptom or deficit: Secondary | ICD-10-CM | POA: Diagnosis not present

## 2015-10-09 DIAGNOSIS — R200 Anesthesia of skin: Secondary | ICD-10-CM | POA: Diagnosis not present

## 2015-10-09 DIAGNOSIS — I10 Essential (primary) hypertension: Secondary | ICD-10-CM | POA: Diagnosis not present

## 2015-10-10 DIAGNOSIS — F319 Bipolar disorder, unspecified: Secondary | ICD-10-CM | POA: Diagnosis not present

## 2015-10-10 DIAGNOSIS — G8929 Other chronic pain: Secondary | ICD-10-CM | POA: Diagnosis not present

## 2015-10-10 DIAGNOSIS — I10 Essential (primary) hypertension: Secondary | ICD-10-CM | POA: Diagnosis not present

## 2015-10-10 DIAGNOSIS — F419 Anxiety disorder, unspecified: Secondary | ICD-10-CM | POA: Diagnosis not present

## 2015-10-10 DIAGNOSIS — F444 Conversion disorder with motor symptom or deficit: Secondary | ICD-10-CM | POA: Diagnosis not present

## 2015-10-13 DIAGNOSIS — F449 Dissociative and conversion disorder, unspecified: Secondary | ICD-10-CM | POA: Diagnosis not present

## 2015-10-13 DIAGNOSIS — F320 Major depressive disorder, single episode, mild: Secondary | ICD-10-CM | POA: Diagnosis not present

## 2015-10-13 DIAGNOSIS — F4310 Post-traumatic stress disorder, unspecified: Secondary | ICD-10-CM | POA: Diagnosis not present

## 2015-10-13 DIAGNOSIS — F609 Personality disorder, unspecified: Secondary | ICD-10-CM | POA: Diagnosis not present

## 2015-10-13 DIAGNOSIS — F450 Somatization disorder: Secondary | ICD-10-CM | POA: Diagnosis not present

## 2015-10-18 DIAGNOSIS — Z1389 Encounter for screening for other disorder: Secondary | ICD-10-CM | POA: Diagnosis not present

## 2015-10-18 DIAGNOSIS — G894 Chronic pain syndrome: Secondary | ICD-10-CM | POA: Diagnosis not present

## 2015-10-18 DIAGNOSIS — M546 Pain in thoracic spine: Secondary | ICD-10-CM | POA: Diagnosis not present

## 2015-10-18 DIAGNOSIS — M545 Low back pain: Secondary | ICD-10-CM | POA: Diagnosis not present

## 2015-10-18 DIAGNOSIS — M542 Cervicalgia: Secondary | ICD-10-CM | POA: Diagnosis not present

## 2015-10-18 DIAGNOSIS — F419 Anxiety disorder, unspecified: Secondary | ICD-10-CM | POA: Diagnosis not present

## 2015-10-18 DIAGNOSIS — R0789 Other chest pain: Secondary | ICD-10-CM | POA: Diagnosis not present

## 2015-10-22 DIAGNOSIS — R079 Chest pain, unspecified: Secondary | ICD-10-CM | POA: Diagnosis not present

## 2015-10-22 DIAGNOSIS — I10 Essential (primary) hypertension: Secondary | ICD-10-CM | POA: Diagnosis not present

## 2015-10-22 DIAGNOSIS — R0789 Other chest pain: Secondary | ICD-10-CM | POA: Diagnosis not present

## 2015-10-23 DIAGNOSIS — E119 Type 2 diabetes mellitus without complications: Secondary | ICD-10-CM | POA: Diagnosis not present

## 2015-10-23 DIAGNOSIS — J45998 Other asthma: Secondary | ICD-10-CM | POA: Diagnosis not present

## 2015-10-23 DIAGNOSIS — G894 Chronic pain syndrome: Secondary | ICD-10-CM | POA: Diagnosis not present

## 2015-10-23 DIAGNOSIS — I10 Essential (primary) hypertension: Secondary | ICD-10-CM | POA: Diagnosis not present

## 2015-10-23 DIAGNOSIS — J4540 Moderate persistent asthma, uncomplicated: Secondary | ICD-10-CM | POA: Diagnosis not present

## 2015-10-25 DIAGNOSIS — H5203 Hypermetropia, bilateral: Secondary | ICD-10-CM | POA: Diagnosis not present

## 2015-10-25 DIAGNOSIS — Z7984 Long term (current) use of oral hypoglycemic drugs: Secondary | ICD-10-CM | POA: Diagnosis not present

## 2015-10-25 DIAGNOSIS — E119 Type 2 diabetes mellitus without complications: Secondary | ICD-10-CM | POA: Diagnosis not present

## 2015-10-25 DIAGNOSIS — Z0100 Encounter for examination of eyes and vision without abnormal findings: Secondary | ICD-10-CM | POA: Diagnosis not present

## 2015-10-25 DIAGNOSIS — I10 Essential (primary) hypertension: Secondary | ICD-10-CM | POA: Diagnosis not present

## 2015-10-29 DIAGNOSIS — R1031 Right lower quadrant pain: Secondary | ICD-10-CM | POA: Diagnosis not present

## 2015-10-29 DIAGNOSIS — R1084 Generalized abdominal pain: Secondary | ICD-10-CM | POA: Diagnosis not present

## 2015-10-29 DIAGNOSIS — R112 Nausea with vomiting, unspecified: Secondary | ICD-10-CM | POA: Diagnosis not present

## 2015-11-09 DIAGNOSIS — R1084 Generalized abdominal pain: Secondary | ICD-10-CM | POA: Diagnosis not present

## 2015-11-09 DIAGNOSIS — B9689 Other specified bacterial agents as the cause of diseases classified elsewhere: Secondary | ICD-10-CM | POA: Diagnosis not present

## 2015-11-09 DIAGNOSIS — J329 Chronic sinusitis, unspecified: Secondary | ICD-10-CM | POA: Diagnosis not present

## 2015-11-09 DIAGNOSIS — Z0000 Encounter for general adult medical examination without abnormal findings: Secondary | ICD-10-CM | POA: Diagnosis not present

## 2015-11-09 DIAGNOSIS — Z23 Encounter for immunization: Secondary | ICD-10-CM | POA: Diagnosis not present

## 2015-11-11 DIAGNOSIS — E119 Type 2 diabetes mellitus without complications: Secondary | ICD-10-CM | POA: Diagnosis not present

## 2015-11-11 DIAGNOSIS — R109 Unspecified abdominal pain: Secondary | ICD-10-CM | POA: Diagnosis not present

## 2015-11-11 DIAGNOSIS — I10 Essential (primary) hypertension: Secondary | ICD-10-CM | POA: Diagnosis not present

## 2015-11-11 DIAGNOSIS — R0602 Shortness of breath: Secondary | ICD-10-CM | POA: Diagnosis not present

## 2015-11-11 DIAGNOSIS — N3289 Other specified disorders of bladder: Secondary | ICD-10-CM | POA: Diagnosis not present

## 2015-11-11 DIAGNOSIS — R079 Chest pain, unspecified: Secondary | ICD-10-CM | POA: Diagnosis not present

## 2015-11-11 DIAGNOSIS — F319 Bipolar disorder, unspecified: Secondary | ICD-10-CM | POA: Diagnosis not present

## 2015-11-12 DIAGNOSIS — Z825 Family history of asthma and other chronic lower respiratory diseases: Secondary | ICD-10-CM | POA: Diagnosis not present

## 2015-11-12 DIAGNOSIS — G4089 Other seizures: Secondary | ICD-10-CM | POA: Diagnosis present

## 2015-11-12 DIAGNOSIS — R109 Unspecified abdominal pain: Secondary | ICD-10-CM | POA: Diagnosis not present

## 2015-11-12 DIAGNOSIS — F332 Major depressive disorder, recurrent severe without psychotic features: Secondary | ICD-10-CM | POA: Diagnosis not present

## 2015-11-12 DIAGNOSIS — F319 Bipolar disorder, unspecified: Secondary | ICD-10-CM | POA: Diagnosis not present

## 2015-11-12 DIAGNOSIS — F458 Other somatoform disorders: Secondary | ICD-10-CM | POA: Diagnosis not present

## 2015-11-12 DIAGNOSIS — F4312 Post-traumatic stress disorder, chronic: Secondary | ICD-10-CM | POA: Diagnosis not present

## 2015-11-12 DIAGNOSIS — E872 Acidosis: Secondary | ICD-10-CM | POA: Diagnosis not present

## 2015-11-12 DIAGNOSIS — Z803 Family history of malignant neoplasm of breast: Secondary | ICD-10-CM | POA: Diagnosis not present

## 2015-11-12 DIAGNOSIS — Z880 Allergy status to penicillin: Secondary | ICD-10-CM | POA: Diagnosis not present

## 2015-11-12 DIAGNOSIS — F329 Major depressive disorder, single episode, unspecified: Secondary | ICD-10-CM | POA: Diagnosis not present

## 2015-11-12 DIAGNOSIS — E860 Dehydration: Secondary | ICD-10-CM | POA: Diagnosis not present

## 2015-11-12 DIAGNOSIS — G40909 Epilepsy, unspecified, not intractable, without status epilepticus: Secondary | ICD-10-CM | POA: Diagnosis not present

## 2015-11-12 DIAGNOSIS — Z8249 Family history of ischemic heart disease and other diseases of the circulatory system: Secondary | ICD-10-CM | POA: Diagnosis not present

## 2015-11-12 DIAGNOSIS — R072 Precordial pain: Secondary | ICD-10-CM | POA: Diagnosis not present

## 2015-11-12 DIAGNOSIS — S069X9A Unspecified intracranial injury with loss of consciousness of unspecified duration, initial encounter: Secondary | ICD-10-CM | POA: Diagnosis not present

## 2015-11-12 DIAGNOSIS — I4581 Long QT syndrome: Secondary | ICD-10-CM | POA: Diagnosis not present

## 2015-11-12 DIAGNOSIS — Z833 Family history of diabetes mellitus: Secondary | ICD-10-CM | POA: Diagnosis not present

## 2015-11-12 DIAGNOSIS — Z823 Family history of stroke: Secondary | ICD-10-CM | POA: Diagnosis not present

## 2015-11-12 DIAGNOSIS — R112 Nausea with vomiting, unspecified: Secondary | ICD-10-CM | POA: Diagnosis not present

## 2015-11-12 DIAGNOSIS — R4182 Altered mental status, unspecified: Secondary | ICD-10-CM | POA: Diagnosis not present

## 2015-11-12 DIAGNOSIS — F445 Conversion disorder with seizures or convulsions: Secondary | ICD-10-CM | POA: Diagnosis not present

## 2015-11-12 DIAGNOSIS — Z881 Allergy status to other antibiotic agents status: Secondary | ICD-10-CM | POA: Diagnosis not present

## 2015-11-12 DIAGNOSIS — R079 Chest pain, unspecified: Secondary | ICD-10-CM | POA: Diagnosis not present

## 2015-11-12 DIAGNOSIS — K219 Gastro-esophageal reflux disease without esophagitis: Secondary | ICD-10-CM | POA: Diagnosis not present

## 2015-11-12 DIAGNOSIS — F410 Panic disorder [episodic paroxysmal anxiety] without agoraphobia: Secondary | ICD-10-CM | POA: Diagnosis not present

## 2015-11-12 DIAGNOSIS — I10 Essential (primary) hypertension: Secondary | ICD-10-CM | POA: Diagnosis not present

## 2015-11-12 DIAGNOSIS — F17210 Nicotine dependence, cigarettes, uncomplicated: Secondary | ICD-10-CM | POA: Diagnosis not present

## 2015-11-12 DIAGNOSIS — J45909 Unspecified asthma, uncomplicated: Secondary | ICD-10-CM | POA: Diagnosis not present

## 2015-11-12 DIAGNOSIS — E119 Type 2 diabetes mellitus without complications: Secondary | ICD-10-CM | POA: Diagnosis not present

## 2015-11-12 DIAGNOSIS — R569 Unspecified convulsions: Secondary | ICD-10-CM | POA: Diagnosis not present

## 2015-11-12 DIAGNOSIS — J449 Chronic obstructive pulmonary disease, unspecified: Secondary | ICD-10-CM | POA: Diagnosis not present

## 2015-11-12 DIAGNOSIS — Z882 Allergy status to sulfonamides status: Secondary | ICD-10-CM | POA: Diagnosis not present

## 2015-11-12 DIAGNOSIS — Z87798 Personal history of other (corrected) congenital malformations: Secondary | ICD-10-CM | POA: Diagnosis not present

## 2015-11-14 ENCOUNTER — Observation Stay (HOSPITAL_COMMUNITY)
Admission: AD | Admit: 2015-11-14 | Discharge: 2015-11-17 | Disposition: A | Discharge status: Home / Self Care | Payer: Commercial Managed Care - HMO | Source: Other Acute Inpatient Hospital | Attending: Internal Medicine | Admitting: Internal Medicine

## 2015-11-14 DIAGNOSIS — Z882 Allergy status to sulfonamides status: Secondary | ICD-10-CM | POA: Insufficient documentation

## 2015-11-14 DIAGNOSIS — R569 Unspecified convulsions: Secondary | ICD-10-CM

## 2015-11-14 DIAGNOSIS — Z833 Family history of diabetes mellitus: Secondary | ICD-10-CM | POA: Insufficient documentation

## 2015-11-14 DIAGNOSIS — I4581 Long QT syndrome: Secondary | ICD-10-CM | POA: Diagnosis not present

## 2015-11-14 DIAGNOSIS — Z87798 Personal history of other (corrected) congenital malformations: Secondary | ICD-10-CM | POA: Insufficient documentation

## 2015-11-14 DIAGNOSIS — G4089 Other seizures: Principal | ICD-10-CM | POA: Insufficient documentation

## 2015-11-14 DIAGNOSIS — I10 Essential (primary) hypertension: Secondary | ICD-10-CM | POA: Diagnosis not present

## 2015-11-14 DIAGNOSIS — F17210 Nicotine dependence, cigarettes, uncomplicated: Secondary | ICD-10-CM | POA: Diagnosis not present

## 2015-11-14 DIAGNOSIS — F329 Major depressive disorder, single episode, unspecified: Secondary | ICD-10-CM | POA: Diagnosis present

## 2015-11-14 DIAGNOSIS — R4182 Altered mental status, unspecified: Secondary | ICD-10-CM | POA: Diagnosis not present

## 2015-11-14 DIAGNOSIS — F410 Panic disorder [episodic paroxysmal anxiety] without agoraphobia: Secondary | ICD-10-CM | POA: Insufficient documentation

## 2015-11-14 DIAGNOSIS — F458 Other somatoform disorders: Secondary | ICD-10-CM | POA: Insufficient documentation

## 2015-11-14 DIAGNOSIS — R9431 Abnormal electrocardiogram [ECG] [EKG]: Secondary | ICD-10-CM | POA: Diagnosis present

## 2015-11-14 DIAGNOSIS — Z880 Allergy status to penicillin: Secondary | ICD-10-CM | POA: Insufficient documentation

## 2015-11-14 DIAGNOSIS — Z8669 Personal history of other diseases of the nervous system and sense organs: Secondary | ICD-10-CM

## 2015-11-14 DIAGNOSIS — Z8249 Family history of ischemic heart disease and other diseases of the circulatory system: Secondary | ICD-10-CM | POA: Insufficient documentation

## 2015-11-14 DIAGNOSIS — F419 Anxiety disorder, unspecified: Secondary | ICD-10-CM | POA: Diagnosis present

## 2015-11-14 DIAGNOSIS — F445 Conversion disorder with seizures or convulsions: Secondary | ICD-10-CM | POA: Diagnosis not present

## 2015-11-14 DIAGNOSIS — Z881 Allergy status to other antibiotic agents status: Secondary | ICD-10-CM | POA: Insufficient documentation

## 2015-11-14 DIAGNOSIS — Z823 Family history of stroke: Secondary | ICD-10-CM | POA: Insufficient documentation

## 2015-11-14 DIAGNOSIS — Z803 Family history of malignant neoplasm of breast: Secondary | ICD-10-CM | POA: Insufficient documentation

## 2015-11-14 DIAGNOSIS — Z825 Family history of asthma and other chronic lower respiratory diseases: Secondary | ICD-10-CM | POA: Insufficient documentation

## 2015-11-14 DIAGNOSIS — K219 Gastro-esophageal reflux disease without esophagitis: Secondary | ICD-10-CM | POA: Diagnosis not present

## 2015-11-14 DIAGNOSIS — F32A Depression, unspecified: Secondary | ICD-10-CM | POA: Diagnosis present

## 2015-11-14 DIAGNOSIS — F909 Attention-deficit hyperactivity disorder, unspecified type: Secondary | ICD-10-CM | POA: Diagnosis present

## 2015-11-14 HISTORY — DX: Conversion disorder with seizures or convulsions: F44.5

## 2015-11-14 HISTORY — DX: Unspecified convulsions: R56.9

## 2015-11-14 HISTORY — DX: Type 2 diabetes mellitus without complications: E11.9

## 2015-11-14 MED ORDER — ENOXAPARIN SODIUM 40 MG/0.4ML ~~LOC~~ SOLN
40.0000 mg | SUBCUTANEOUS | Status: DC
Start: 2015-11-14 — End: 2015-11-17
  Administered 2015-11-14 – 2015-11-16 (×3): 40 mg via SUBCUTANEOUS
  Filled 2015-11-14 (×3): qty 0.4

## 2015-11-14 NOTE — H&P (Signed)
History and Physical    Meredith Cherry [id number] DOB: Feb 24, 1970 DOA: 11/14/2015   PCP: Myrlene Broker, MD Chief Complaint: No chief complaint on file.   HPI: Meredith Cherry is a 45 y.o. female with medical history significant of fairly extensive psychiatric history including somatization disorder, conversion disorder, numerous admissions to [facility] recently.  Most recently admitted 2 days ago for N/V.  Today had "unresponsiveness" episode.  1mg  ativan no change.  Tele neuro consult at [facility] done, furrows brows to sternal rub, when they tried dropping her hand on her face, it falls but avoids her face.  [facility] wanted transfer for neuro consult.  Review of Systems: Patient saying a few words but not really answering questions right now.   Past Medical History:  Diagnosis Date  . ADHD (attention deficit hyperactivity disorder)   . Allergic rhinitis   . Anxiety   . Asthma   . Chiari malformation type I ([place])   . Chronic migraine   . Depression   . GERD (gastroesophageal reflux disease)   . Hypertension   . Panic attack   . Psychiatric disorder     Past Surgical History:  Procedure Laterality Date  . CHOLECYSTECTOMY    . TUBAL LIGATION       reports that she has never smoked. She does not have any smokeless tobacco history on file. She reports that she does not drink alcohol or use drugs.  Allergies  Allergen Reactions  . Bactrim [Sulfamethoxazole-Trimethoprim]   . Penicillins   . Ciprofloxacin Rash  . Sulfa Antibiotics Rash    Family History  Problem Relation Age of Onset  . Stroke Mother     x 5  . Hypertension Mother   . Heart disease Mother   . Diabetes Mother   . Diabetes Father   . Heart disease Maternal Grandmother   . Breast cancer Paternal Grandmother   . Asthma Brother       Prior to Admission medications   Medication Sig Start Date End Date Taking? Authorizing Provider  albuterol (PROVENTIL) (2.5 MG/3ML) 0.083% nebulizer  solution Take 2.5 mg by nebulization every 6 (six) hours as needed.    Historical Provider, MD  amLODipine (NORVASC) 10 MG tablet Take 10 mg by mouth daily.    Historical Provider, MD  bethanechol (URECHOLINE) 25 MG tablet Take 37.5 mg by mouth 4 (four) times daily.    Historical Provider, MD  citalopram (CELEXA) 20 MG tablet Take 20 mg by mouth daily.    Historical Provider, MD  cloNIDine (CATAPRES) 0.1 MG tablet Take 0.1 mg by mouth 2 (two) times daily.    Historical Provider, MD  diazepam (VALIUM) 10 MG tablet Take 10 mg by mouth daily.    Historical Provider, MD  diltiazem (CARDIZEM CD) 240 MG 24 hr capsule Take 240 mg by mouth daily.    Historical Provider, MD  divalproex (DEPAKOTE) 125 MG DR tablet Take 250 mg by mouth 2 (two) times daily.    Historical Provider, MD  gabapentin (NEURONTIN) 300 MG capsule Take 900 mg by mouth 4 (four) times daily.    Historical Provider, MD  mometasone-formoterol (DULERA) 100-5 MCG/ACT AERO Inhale 2 puffs into the lungs as needed for wheezing.     Historical Provider, MD  Potassium 99 MG TABS Take 99 mg by mouth daily.    Historical Provider, MD  predniSONE (DELTASONE) 20 MG tablet Take 2 tablets (40 mg total) by mouth daily with breakfast. For the next four days 05/19/15  Carmin Muskrat, MD  QUEtiapine (SEROQUEL XR) 300 MG 24 hr tablet Take 300 mg by mouth at bedtime.    Historical Provider, MD  topiramate (TOPAMAX) 100 MG tablet Take 100 mg by mouth daily as needed (headache).     Historical Provider, MD    Physical Exam: There were no vitals filed for this visit.    Constitutional: NAD, calm, comfortable Eyes: PERRL, lids and conjunctivae normal ENMT: Mucous membranes are moist. Posterior pharynx clear of any exudate or lesions.Normal dentition.  Neck: normal, supple, no masses, no thyromegaly Respiratory: clear to auscultation bilaterally, no wheezing, no crackles. Normal respiratory effort. No accessory muscle use.  Cardiovascular: Regular rate  and rhythm, no murmurs / rubs / gallops. No extremity edema. 2+ pedal pulses. No carotid bruits.  Abdomen: no tenderness, no masses palpated. No hepatosplenomegaly. Bowel sounds positive.  Musculoskeletal: no clubbing / cyanosis. No joint deformity upper and lower extremities. Good ROM, no contractures. Normal muscle tone.  Skin: no rashes, lesions, ulcers. No induration Neurologic: Inconsistent neuro exam, patient MAE, follows some commands slowly, when hand dropped on face she will move to side so it dosent hit her face. Psychiatric: See neuro exam above   Labs on Admission: I have personally reviewed following labs and imaging studies  CBC: No results for input(s): WBC, NEUTROABS, HGB, HCT, MCV, PLT in the last 168 hours. Basic Metabolic Panel: No results for input(s): NA, K, CL, CO2, GLUCOSE, BUN, CREATININE, CALCIUM, MG, PHOS in the last 168 hours. GFR: CrCl cannot be calculated (Unknown ideal weight.). Liver Function Tests: No results for input(s): AST, ALT, ALKPHOS, BILITOT, PROT, ALBUMIN in the last 168 hours. No results for input(s): LIPASE, AMYLASE in the last 168 hours. No results for input(s): AMMONIA in the last 168 hours. Coagulation Profile: No results for input(s): INR, PROTIME in the last 168 hours. Cardiac Enzymes: No results for input(s): CKTOTAL, CKMB, CKMBINDEX, TROPONINI in the last 168 hours. BNP (last 3 results) No results for input(s): PROBNP in the last 8760 hours. HbA1C: No results for input(s): HGBA1C in the last 72 hours. CBG: No results for input(s): GLUCAP in the last 168 hours. Lipid Profile: No results for input(s): CHOL, HDL, LDLCALC, TRIG, CHOLHDL, LDLDIRECT in the last 72 hours. Thyroid Function Tests: No results for input(s): TSH, T4TOTAL, FREET4, T3FREE, THYROIDAB in the last 72 hours. Anemia Panel: No results for input(s): VITAMINB12, FOLATE, FERRITIN, TIBC, IRON, RETICCTPCT in the last 72 hours. Urine analysis: No results found for:  COLORURINE, APPEARANCEUR, LABSPEC, PHURINE, GLUCOSEU, HGBUR, BILIRUBINUR, KETONESUR, PROTEINUR, UROBILINOGEN, NITRITE, LEUKOCYTESUR Sepsis Labs: @LABRCNTIP (procalcitonin:4,lacticidven:4) )No results found for this or any previous visit (from the past 240 hour(s)).   Radiological Exams on Admission: No results found.  EKG: Independently reviewed.  Assessment/Plan Principal Problem:   Pseudoseizures    1. Pseudoseizures / malingering / other psychiatric disorder - 1. Neuro going to see patient and confirm no true neurologic disorder. 2. After this, patient likely needs psychiatric evaluation.   DVT prophylaxis: Lovenox Code Status: Full Family Communication: No family in room Consults called: Neuro Admission status: Admit to inpatient   Etta Quill DO [facility] Pager [phone] from 7PM-7AM  If 7AM-7PM, please contact the day physician for the patient [email or web address] Password [facility]  11/14/2015, 8:18 PM

## 2015-11-14 NOTE — Consult Note (Signed)
Admission H&P    Chief Complaint: Altered mental status with equivocal seizure activity.  HPI: Meredith Cherry is an 45 y.o. female history of depression, anxiety with panic attacks, hypertension, attention deficit disorder, asthma and Chiari malformation type I, transferred from [facility] for neurologic evaluation for altered mental status and possible seizure activity. Patient presented to the ED apparently unresponsive. Possible seizure activity was consideration. Teleneurologist felt that patient may have had a seizure and could possibly be an status, and recommended transfer to a facility with neurology service. Patient was given 1 mg of Ativan in the ED but no other medications for suspected seizure activity. She has become progressively more responsive and alert. No seizure activity was reported in transport nor since she has arrived here. Laboratory studies were unremarkable except for borderline low magnesium level. She has no documented history of seizure activity.  Past Medical History:  Diagnosis Date  . ADHD (attention deficit hyperactivity disorder)   . Allergic rhinitis   . Anxiety   . Asthma   . Chiari malformation type I ([place])   . Chronic migraine   . Depression   . GERD (gastroesophageal reflux disease)   . Hypertension   . Panic attack   . Psychiatric disorder     Past Surgical History:  Procedure Laterality Date  . CHOLECYSTECTOMY    . TUBAL LIGATION      Family History  Problem Relation Age of Onset  . Stroke Mother     x 5  . Hypertension Mother   . Heart disease Mother   . Diabetes Mother   . Diabetes Father   . Heart disease Maternal Grandmother   . Breast cancer Paternal Grandmother   . Asthma Brother    Social History:  reports that she has never smoked. She does not have any smokeless tobacco history on file. She reports that she does not drink alcohol or use drugs.  Allergies:  Allergies  Allergen Reactions  . Bactrim  [Sulfamethoxazole-Trimethoprim]   . Penicillins   . Ciprofloxacin Rash  . Sulfa Antibiotics Rash    Medications Prior to Admission  Medication Sig Dispense Refill  . albuterol (PROVENTIL) (2.5 MG/3ML) 0.083% nebulizer solution Take 2.5 mg by nebulization every 6 (six) hours as needed.    Marland Kitchen amLODipine (NORVASC) 10 MG tablet Take 10 mg by mouth daily.    . bethanechol (URECHOLINE) 25 MG tablet Take 37.5 mg by mouth 4 (four) times daily.    . citalopram (CELEXA) 20 MG tablet Take 20 mg by mouth daily.    . cloNIDine (CATAPRES) 0.1 MG tablet Take 0.1 mg by mouth 2 (two) times daily.    . diazepam (VALIUM) 10 MG tablet Take 10 mg by mouth daily.    Marland Kitchen diltiazem (CARDIZEM CD) 240 MG 24 hr capsule Take 240 mg by mouth daily.    . divalproex (DEPAKOTE) 125 MG DR tablet Take 250 mg by mouth 2 (two) times daily.    Marland Kitchen gabapentin (NEURONTIN) 300 MG capsule Take 900 mg by mouth 4 (four) times daily.    . mometasone-formoterol (DULERA) 100-5 MCG/ACT AERO Inhale 2 puffs into the lungs as needed for wheezing.     . Potassium 99 MG TABS Take 99 mg by mouth daily.    . predniSONE (DELTASONE) 20 MG tablet Take 2 tablets (40 mg total) by mouth daily with breakfast. For the next four days 8 tablet 0  . QUEtiapine (SEROQUEL XR) 300 MG 24 hr tablet Take 300 mg by mouth  at bedtime.    . topiramate (TOPAMAX) 100 MG tablet Take 100 mg by mouth daily as needed (headache).       ROS: History obtained from chart review and the patient  General ROS: negative for - chills, fatigue, fever, night sweats, weight gain or weight loss Psychological ROS: negative for - behavioral disorder, hallucinations, memory difficulties, mood swings or suicidal ideation Ophthalmic ROS: negative for - blurry vision, double vision, eye pain or loss of vision ENT ROS: negative for - epistaxis, nasal discharge, oral lesions, sore throat, tinnitus or vertigo Allergy and Immunology ROS: negative for - hives or itchy/watery  eyes Hematological and Lymphatic ROS: negative for - bleeding problems, bruising or swollen lymph nodes Endocrine ROS: negative for - galactorrhea, hair pattern changes, polydipsia/polyuria or temperature intolerance Respiratory ROS: negative for - cough, hemoptysis, shortness of breath or wheezing Cardiovascular ROS: negative for - chest pain, dyspnea on exertion, edema or irregular heartbeat Gastrointestinal ROS: Hospitalized earlier in the week from nausea and vomiting Genito-Urinary ROS: negative for - dysuria, hematuria, incontinence or urinary frequency/urgency Musculoskeletal ROS: negative for - joint swelling or muscular weakness Neurological ROS: as noted in HPI Dermatological ROS: negative for rash and skin lesion changes  Physical Examination: There were no vitals taken for this visit.  HEENT-  Normocephalic, no lesions, without obvious abnormality.  Normal external eye and conjunctiva.  Normal TM's bilaterally.  Normal auditory canals and external ears. Normal external nose, mucus membranes and septum.  Normal pharynx. Neck supple with no masses, nodes, nodules or enlargement. Cardiovascular - regular rate and rhythm, S1, S2 normal, no murmur, click, rub or gallop Lungs - chest clear, no wheezing, rales, normal symmetric air entry Abdomen - soft, non-tender; bowel sounds normal; no masses,  no organomegaly Extremities - no joint deformities, effusion, or inflammation and no edema  Neurologic Examination: Mental Status: Alert, oriented, depressed affect, no acute distress.  Speech fluent without evidence of aphasia. Able to follow commands, although reluctantly. Cranial Nerves: II-Visual fields were normal. III/IV/VI-Pupils were equal and reacted normally to light. Extraocular movements were full and conjugate.    V/VII-reduced perception tactile sensation of left-sided face compared to the right; no facial weakness. VIII-normal. X-normal speech. XI: trapezius strength/neck  flexion strength normal bilaterally XII-midline tongue extension with normal strength. Motor: Generally poor effort but equal movements of extremities with no signs of weakness. Normal muscle tone throughout. Sensory: Normal throughout. Deep Tendon Reflexes: 1+ and symmetric. Plantars: Flexor bilaterally Cerebellar: Normal correlation of upper extremities. Carotid auscultation: Normal  No results found for this or any previous visit (from the past 48 hour(s)). No results found.  Assessment/Plan 45 year old lady with known psychiatric abnormalities transferred for neurological evaluation after presenting to the ED at [facility] following an episode of apparent unresponsiveness. Possible seizure disorder of new onset was a consideration. Neurological exam showed no focal deficits. Patient does not appear to be having any seizure activity at this point.  Recommendations: 1. MRI of the brain without and with contrast 2. EEG, routine adult study. If patient has recurrence of altered mental status unresponsiveness, or seizure-like activity recurs, a more prolonged EEG with video recording will be considered. 3. Psychiatric consult if above studies are unremarkable.  C.R. Nicole Kindred, [place] [facility] [phone]  11/14/2015, 8:52 PM

## 2015-11-15 ENCOUNTER — Inpatient Hospital Stay (HOSPITAL_COMMUNITY)
Admission: AD | Admit: 2015-11-15 | Discharge: 2015-11-15 | Disposition: A | Discharge status: Home / Self Care | Payer: Commercial Managed Care - HMO | Source: Other Acute Inpatient Hospital | Attending: Internal Medicine | Admitting: Internal Medicine

## 2015-11-15 ENCOUNTER — Inpatient Hospital Stay (HOSPITAL_COMMUNITY): Payer: Commercial Managed Care - HMO

## 2015-11-15 ENCOUNTER — Encounter (HOSPITAL_COMMUNITY): Payer: Self-pay | Admitting: Internal Medicine

## 2015-11-15 DIAGNOSIS — Z833 Family history of diabetes mellitus: Secondary | ICD-10-CM

## 2015-11-15 DIAGNOSIS — R4182 Altered mental status, unspecified: Secondary | ICD-10-CM | POA: Diagnosis not present

## 2015-11-15 DIAGNOSIS — F329 Major depressive disorder, single episode, unspecified: Secondary | ICD-10-CM | POA: Diagnosis not present

## 2015-11-15 DIAGNOSIS — G4089 Other seizures: Secondary | ICD-10-CM | POA: Diagnosis not present

## 2015-11-15 DIAGNOSIS — Z825 Family history of asthma and other chronic lower respiratory diseases: Secondary | ICD-10-CM

## 2015-11-15 DIAGNOSIS — F4312 Post-traumatic stress disorder, chronic: Secondary | ICD-10-CM

## 2015-11-15 DIAGNOSIS — Z880 Allergy status to penicillin: Secondary | ICD-10-CM

## 2015-11-15 DIAGNOSIS — F909 Attention-deficit hyperactivity disorder, unspecified type: Secondary | ICD-10-CM | POA: Diagnosis not present

## 2015-11-15 DIAGNOSIS — F419 Anxiety disorder, unspecified: Secondary | ICD-10-CM | POA: Diagnosis not present

## 2015-11-15 DIAGNOSIS — Z87798 Personal history of other (corrected) congenital malformations: Secondary | ICD-10-CM | POA: Diagnosis not present

## 2015-11-15 DIAGNOSIS — Z630 Problems in relationship with spouse or partner: Secondary | ICD-10-CM

## 2015-11-15 DIAGNOSIS — Z8669 Personal history of other diseases of the nervous system and sense organs: Secondary | ICD-10-CM

## 2015-11-15 DIAGNOSIS — Z8249 Family history of ischemic heart disease and other diseases of the circulatory system: Secondary | ICD-10-CM

## 2015-11-15 DIAGNOSIS — I10 Essential (primary) hypertension: Secondary | ICD-10-CM | POA: Diagnosis not present

## 2015-11-15 DIAGNOSIS — Z803 Family history of malignant neoplasm of breast: Secondary | ICD-10-CM

## 2015-11-15 DIAGNOSIS — F17210 Nicotine dependence, cigarettes, uncomplicated: Secondary | ICD-10-CM | POA: Diagnosis not present

## 2015-11-15 DIAGNOSIS — I4581 Long QT syndrome: Secondary | ICD-10-CM | POA: Diagnosis not present

## 2015-11-15 DIAGNOSIS — G9340 Encephalopathy, unspecified: Secondary | ICD-10-CM | POA: Insufficient documentation

## 2015-11-15 DIAGNOSIS — F445 Conversion disorder with seizures or convulsions: Secondary | ICD-10-CM | POA: Diagnosis present

## 2015-11-15 DIAGNOSIS — K219 Gastro-esophageal reflux disease without esophagitis: Secondary | ICD-10-CM | POA: Diagnosis present

## 2015-11-15 DIAGNOSIS — F410 Panic disorder [episodic paroxysmal anxiety] without agoraphobia: Secondary | ICD-10-CM | POA: Diagnosis not present

## 2015-11-15 DIAGNOSIS — F332 Major depressive disorder, recurrent severe without psychotic features: Secondary | ICD-10-CM | POA: Diagnosis not present

## 2015-11-15 DIAGNOSIS — R9431 Abnormal electrocardiogram [ECG] [EKG]: Secondary | ICD-10-CM | POA: Diagnosis present

## 2015-11-15 DIAGNOSIS — F32A Depression, unspecified: Secondary | ICD-10-CM | POA: Diagnosis present

## 2015-11-15 DIAGNOSIS — Z888 Allergy status to other drugs, medicaments and biological substances status: Secondary | ICD-10-CM

## 2015-11-15 DIAGNOSIS — F458 Other somatoform disorders: Secondary | ICD-10-CM | POA: Diagnosis not present

## 2015-11-15 DIAGNOSIS — Z79899 Other long term (current) drug therapy: Secondary | ICD-10-CM

## 2015-11-15 HISTORY — DX: Conversion disorder with seizures or convulsions: F44.5

## 2015-11-15 HISTORY — DX: Personal history of other diseases of the nervous system and sense organs: Z86.69

## 2015-11-15 HISTORY — DX: Abnormal electrocardiogram (ECG) (EKG): R94.31

## 2015-11-15 LAB — TROPONIN I: Troponin I: <0.03 ng/mL (ref <0.03)

## 2015-11-15 MED ORDER — ASPIRIN EC 81 MG PO TBEC
81.0000 mg | DELAYED_RELEASE_TABLET | Freq: Every day | ORAL | Status: DC
  Administered 2015-11-15 – 2015-11-17 (×3): 81 mg via ORAL
  Filled 2015-11-15 (×3): qty 1

## 2015-11-15 MED ORDER — LORAZEPAM 2 MG/ML IJ SOLN
1.0000 mg | Freq: Once | INTRAMUSCULAR | Status: AC
  Administered 2015-11-15: 1 mg via INTRAVENOUS
  Filled 2015-11-15: qty 1

## 2015-11-15 MED ORDER — ONDANSETRON HCL 4 MG/2ML IJ SOLN: 4.0000 mg | Freq: Four times a day (QID) | INTRAMUSCULAR | Status: DC | PRN

## 2015-11-15 MED ORDER — ALBUTEROL SULFATE (2.5 MG/3ML) 0.083% IN NEBU: 2.5000 mg | INHALATION_SOLUTION | Freq: Four times a day (QID) | RESPIRATORY_TRACT | Status: DC | PRN

## 2015-11-15 MED ORDER — HYDRALAZINE HCL 20 MG/ML IJ SOLN
10.0000 mg | Freq: Once | INTRAMUSCULAR | Status: AC
  Administered 2015-11-15: 10 mg via INTRAVENOUS
  Filled 2015-11-15: qty 1

## 2015-11-15 MED ORDER — CITALOPRAM HYDROBROMIDE 40 MG PO TABS
40.0000 mg | ORAL_TABLET | Freq: Every day | ORAL | Status: DC
  Administered 2015-11-16 – 2015-11-17 (×2): 40 mg via ORAL
  Filled 2015-11-15 (×2): qty 1

## 2015-11-15 MED ORDER — DILTIAZEM HCL ER COATED BEADS 240 MG PO CP24
240.0000 mg | ORAL_CAPSULE | Freq: Every day | ORAL | Status: DC
  Administered 2015-11-15 – 2015-11-17 (×3): 240 mg via ORAL
  Filled 2015-11-15 (×3): qty 1

## 2015-11-15 MED ORDER — GADOBENATE DIMEGLUMINE 529 MG/ML IV SOLN
20.0000 mL | Freq: Once | INTRAVENOUS | Status: AC | PRN
  Administered 2015-11-15: 20 mL via INTRAVENOUS

## 2015-11-15 MED ORDER — MOMETASONE FURO-FORMOTEROL FUM 100-5 MCG/ACT IN AERO
2.0000 | INHALATION_SPRAY | Freq: Two times a day (BID) | RESPIRATORY_TRACT | Status: DC
  Administered 2015-11-15: 2 via RESPIRATORY_TRACT
  Filled 2015-11-15: qty 8.8

## 2015-11-15 MED ORDER — CLONIDINE HCL 0.1 MG PO TABS
0.1000 mg | ORAL_TABLET | Freq: Two times a day (BID) | ORAL | Status: DC
  Administered 2015-11-15 – 2015-11-17 (×5): 0.1 mg via ORAL
  Filled 2015-11-15 (×5): qty 1

## 2015-11-15 MED ORDER — DIVALPROEX SODIUM 250 MG PO DR TAB
250.0000 mg | DELAYED_RELEASE_TABLET | Freq: Three times a day (TID) | ORAL | Status: DC
  Administered 2015-11-15 – 2015-11-17 (×7): 250 mg via ORAL
  Filled 2015-11-15 (×7): qty 1

## 2015-11-15 MED ORDER — AMLODIPINE BESYLATE 10 MG PO TABS
10.0000 mg | ORAL_TABLET | Freq: Every day | ORAL | Status: DC
  Administered 2015-11-15 – 2015-11-17 (×3): 10 mg via ORAL
  Filled 2015-11-15 (×3): qty 1

## 2015-11-15 MED ORDER — GABAPENTIN 400 MG PO CAPS
400.0000 mg | ORAL_CAPSULE | Freq: Two times a day (BID) | ORAL | Status: DC
  Administered 2015-11-15 – 2015-11-17 (×4): 400 mg via ORAL
  Filled 2015-11-15 (×4): qty 1

## 2015-11-15 MED ORDER — CLONAZEPAM 0.5 MG PO TABS
0.2500 mg | ORAL_TABLET | Freq: Three times a day (TID) | ORAL | Status: DC
  Administered 2015-11-15 – 2015-11-16 (×3): 0.25 mg via ORAL
  Filled 2015-11-15 (×3): qty 1

## 2015-11-15 MED ORDER — PRAZOSIN HCL 2 MG PO CAPS
2.0000 mg | ORAL_CAPSULE | Freq: Every day | ORAL | Status: DC
  Administered 2015-11-15 – 2015-11-16 (×2): 2 mg via ORAL
  Filled 2015-11-15 (×3): qty 1

## 2015-11-15 MED ORDER — CITALOPRAM HYDROBROMIDE 10 MG PO TABS
20.0000 mg | ORAL_TABLET | Freq: Every day | ORAL | Status: DC
  Administered 2015-11-15: 20 mg via ORAL
  Filled 2015-11-15: qty 2

## 2015-11-15 MED ORDER — IBUPROFEN 200 MG PO TABS
600.0000 mg | ORAL_TABLET | Freq: Once | ORAL | Status: AC
  Administered 2015-11-15: 600 mg via ORAL
  Filled 2015-11-15: qty 3

## 2015-11-15 NOTE — Significant Event (Signed)
Rapid Response Event Note  Overview: Time Called: [id number] Arrival Time: 1127 Event Type: Cardiac  Initial Focused Assessment: Per staff shortly after EEG completed patient began to complain of chest pain.  Upon my arrival she states that it feels like something is sitting on her chest.  Lung sounds clear, heart tones regular.  BP 175/110  HR 125  RR 20  O2 sat 92% on 3l [place]   Interventions: 12 lead EKG done,  Unremarkable except prolong QT Patient became unresponsive.  Does not respond to sternal rub.  Oral care performed patient awoke and interacted with staff.   MD notified of event.  Orders received for cardiac tele, troponin x3 and daily baby asa. Per MD patient has been on Seroquel, but this medication was d/c'd last night, no additional QT prolonging meds on medication list.     Plan of Care (if not transferred): RN to call if assistance needed  Event Summary: Name of Physician Notified: Dr Rockne Menghini at 1150    at          [place], Meredith Cherry

## 2015-11-15 NOTE — Progress Notes (Addendum)
Pt arrived from [place] at 1950. Eyes closed, not responding to simple questions. Follow some commands. More alert with eyes open as the night passed and able to express needs. Noted pt in tears over not remembering what had happened and being apologetic of not able to recall.

## 2015-11-15 NOTE — Progress Notes (Signed)
RN called to patients room and upon entrance the patient was complaining of chest pain, stating: " it feels like something is sitting on my chest".  Vitals signs taken: BP 167/113, HR 126, R 20, O2 97% 3 L [place].  MD paged and made aware, no new orders received, MD will be by to see the patient.  Will continue t omonitor.  Cori Razor, RN

## 2015-11-15 NOTE — Progress Notes (Signed)
Pt asked "did  I went for a test already." Pt forgotten she had her MRI done. Starts crying and says "I'm sorry."  When ask her names, pt states " I don't know." When name options was given pt says "yes" to Meredith Cherry, "no" to [place]. When told her name was Meredith Cherry, she states " I will forgot in 5 minutes."
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
## 2015-11-15 NOTE — Procedures (Signed)
HPI:  45 y/o with seizure like episodes  TECHNICAL SUMMARY:  A multichannel referential and bipolar montage EEG using the standard international 10-20 system was performed on the patient described as awake.  The dominant background activity consists of 10 hertz activity seen most prominantly over the posterior head region.  The backgound activity is reactive to eye opening and closing procedures.  Low voltage fast (beta) activity is distributed symmetrically and maximally over the anterior head regions.  ACTIVATION:  Stepwise photic stimulation at 4-20 flashes per second was performed and did not elicit any abnormal waveforms.  Hyperventilation was performed  EPILEPTIFORM ACTIVITY:  There were no spikes, sharp waves or paroxysmal activity.  The patient did have 2 shaking events during the recording.  One of these events occurred during photic stimulation.  She was given an alcohol patch which caused the event to cease.  The second event was not during photic stimulation, but also was stopped by an alcohol patch.  There were no changes in the background activity of the recording with either of these events.  SLEEP:  No sleep  CARDIAC:  The EKG lead revealed a regular sinus rhythm.  IMPRESSION:  This is a normal EEG for the patients stated age.  There were no focal, hemispheric, or lateralizing features.  As above, the patient had two events during the recording in which she shook all over (the one was during photic stimulation).  There were no changes in the background activity of the recording with these events and there was no evidence for any epileptiform nature of these events.  Correlate clinically.

## 2015-11-15 NOTE — Progress Notes (Signed)
EEG Completed; Results Pending Called Rapid Response at end of study- pt complained of chest pain.

## 2015-11-15 NOTE — Care Management Obs Status (Signed)
[place] NOTIFICATION   Patient Details  Name: ZELLAH BLANSETT MRN: [id number] Date of Birth: [birth year]   Medicare Observation Status Notification Given:       Pollie Friar, RN 11/15/2015, 4:34 PM
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
## 2015-11-15 NOTE — Care Management Obs Status (Signed)
[place] NOTIFICATION   Patient Details  Name: Meredith Cherry MRN: [id number] Date of Birth: April 26, 1970   Medicare Observation Status Notification Given:       Pollie Friar, RN 11/15/2015, 4:33 PM

## 2015-11-16 ENCOUNTER — Encounter (HOSPITAL_COMMUNITY): Payer: Self-pay | Admitting: General Practice

## 2015-11-16 DIAGNOSIS — F329 Major depressive disorder, single episode, unspecified: Secondary | ICD-10-CM | POA: Diagnosis not present

## 2015-11-16 DIAGNOSIS — F419 Anxiety disorder, unspecified: Secondary | ICD-10-CM | POA: Diagnosis not present

## 2015-11-16 DIAGNOSIS — F410 Panic disorder [episodic paroxysmal anxiety] without agoraphobia: Secondary | ICD-10-CM | POA: Diagnosis not present

## 2015-11-16 DIAGNOSIS — G4089 Other seizures: Secondary | ICD-10-CM | POA: Diagnosis not present

## 2015-11-16 DIAGNOSIS — F17210 Nicotine dependence, cigarettes, uncomplicated: Secondary | ICD-10-CM | POA: Diagnosis not present

## 2015-11-16 DIAGNOSIS — Z87798 Personal history of other (corrected) congenital malformations: Secondary | ICD-10-CM | POA: Diagnosis not present

## 2015-11-16 DIAGNOSIS — K219 Gastro-esophageal reflux disease without esophagitis: Secondary | ICD-10-CM | POA: Diagnosis not present

## 2015-11-16 DIAGNOSIS — F445 Conversion disorder with seizures or convulsions: Secondary | ICD-10-CM | POA: Diagnosis not present

## 2015-11-16 DIAGNOSIS — F909 Attention-deficit hyperactivity disorder, unspecified type: Secondary | ICD-10-CM | POA: Diagnosis not present

## 2015-11-16 DIAGNOSIS — F458 Other somatoform disorders: Secondary | ICD-10-CM | POA: Diagnosis not present

## 2015-11-16 DIAGNOSIS — I4581 Long QT syndrome: Secondary | ICD-10-CM | POA: Diagnosis not present

## 2015-11-16 DIAGNOSIS — F332 Major depressive disorder, recurrent severe without psychotic features: Secondary | ICD-10-CM | POA: Diagnosis not present

## 2015-11-16 DIAGNOSIS — I10 Essential (primary) hypertension: Secondary | ICD-10-CM | POA: Diagnosis not present

## 2015-11-16 LAB — TROPONIN I

## 2015-11-16 MED ORDER — LORAZEPAM 1 MG PO TABS
1.0000 mg | ORAL_TABLET | Freq: Once | ORAL | Status: AC
  Administered 2015-11-16: 1 mg via ORAL
  Filled 2015-11-16: qty 1

## 2015-11-16 MED ORDER — PROCHLORPERAZINE MALEATE 5 MG PO TABS
5.0000 mg | ORAL_TABLET | Freq: Once | ORAL | Status: AC
Start: 2015-11-16 — End: 2015-11-16
  Administered 2015-11-16: 5 mg via ORAL
  Filled 2015-11-16: qty 1

## 2015-11-16 MED ORDER — GI COCKTAIL ~~LOC~~
30.0000 mL | Freq: Three times a day (TID) | ORAL | Status: DC | PRN
  Filled 2015-11-16: qty 30

## 2015-11-16 MED ORDER — GI COCKTAIL ~~LOC~~
30.0000 mL | Freq: Once | ORAL | Status: AC
  Administered 2015-11-16: 30 mL via ORAL
  Filled 2015-11-16: qty 30

## 2015-11-16 MED ORDER — CLONAZEPAM 0.5 MG PO TABS
0.5000 mg | ORAL_TABLET | Freq: Three times a day (TID) | ORAL | Status: DC
  Administered 2015-11-16 – 2015-11-17 (×4): 0.5 mg via ORAL
  Filled 2015-11-16 (×4): qty 1

## 2015-11-16 MED ORDER — IBUPROFEN 200 MG PO TABS: 400.0000 mg | ORAL_TABLET | Freq: Four times a day (QID) | ORAL | Status: DC | PRN

## 2015-11-16 NOTE — Progress Notes (Signed)
Chart reviewed. Patient seen by Dr. Nicole Kindred for spells with non-physiologic findings on his exam. I have personally reviewed the MRI brain which shows evidence of prior suboccipital decompression but no other findings. EEG is normal. She has a history significant for conversion disorder and somatization and current episodes are consistent with nonepileptic seizures (AKA pseudoseizures). Agree with and appreciate psychiatry consultation. No additional recs from neurology perspective and will sign off.

## 2015-11-16 NOTE — Patient Outreach (Signed)
[facility]) Care Management  11/16/2015  MILESSA CUMBA October 23, 1970 [id number]   Request received from Dannielle Huh, RN to mail patient community resource information for transportation and private duty care agencies. Information mailed today, 11/16/15  Josepha Pigg, [place] Management Assistant

## 2015-11-16 NOTE — Progress Notes (Signed)
Progress Note    Meredith Cherry  [id number] DOB: 1970-05-31  DOA: 11/14/2015 PCP: Myrlene Broker, MD    Brief Narrative:   Chief complaint: Follow-up altered mental status  Meredith Cherry is an 45 y.o. female with a PMH of a fairly extensive psychiatric history including somatization disorder, conversion disorder, depression, anxiety with panic attacks, ADHD and recurrent hospitalizations for various somatic complaints, most recently hospitalized a few days ago for evaluation of nausea/vomiting, transferred from [facility] for evaluation of altered mental status/unresponsiveness with possible seizure activity on 11/14/15 with request for neurology consultation. Evaluated by Dr. Nicole Kindred and found to have no focal deficits on neurological exam.  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 MRI stable when compared to prior. EEG negative. The patient has a long-standing history of psychiatric illness, and her presenting symptoms are most consistent with her known history of conversion disorder/somatization. Evaluated by psychiatrist 11/15/15. Medications adjusted. Not felt to need inpatient psychiatric stabilization. Consult psychiatric social worker to set up appropriate outpatient therapy.  Active Problems:   Prolonged QT interval Monitoring on telemetry. Patient is on multiple medications that may prolong the QT interval. These include Celexa, albuterol, and Dulera. Seroquel and Dulera have been stopped.    History of Chiari malformation Status post suboccipital decompression for Chiari malformation, without residual cervicomedullary compression.    Depression/anxiety/ADHD/somatization disorder/conversion disorder Panic attack Psychiatry consultation performed. The patient is in crisis. Medications adjusted by Dr. Louretta Shorten and include: Celexa, Klonopin, Depakote. We'll consult social worker for evaluation of home situation.   Hypertension Continue Norvasc Catapres, Cardizem.    Asthma Continue PRN albuterol.   Family Communication/Anticipated D/C date and plan/Code Status   DVT prophylaxis: Lovenox ordered. Code Status: Full Code.  Family Communication: No family at the bedside. Disposition Plan: Home after seen by psychiatric social worker if home situation deemed safe.   Medical Consultants:    Neurology  Psychiatry   Procedures:    EEG 11/15/15: This is a normal EEG for the patients stated age.  There were no focal, hemispheric, or lateralizing features.  As above, the patient had two events during the recording in which she shook all over (the one was during photic stimulation).  There were no changes in the background activity of the recording with these events and there was no evidence for any epileptiform nature of these events.  Correlate clinically.  Anti-Infectives:    None  Subjective:   The patient continues to have multiple somatic complaints, abdominal pain, headache. She says that adjustment of her medications have slightly helped, and that she was able to sleep some last night. Remains very anxious and depressed.  Objective:    Vitals:   11/15/15 1810 11/15/15 2109 11/16/15 0133 11/16/15 0618  BP: (!) 142/85 (!) 141/115 121/71 117/67  Pulse: (!) 104 (!) 112 (!) 109 99  Resp: 18 18 18 18   Temp: 98.2 F (36.8 C) 97.7 F (36.5 C) 98.1 F (36.7 C) 98.1 F (36.7 C)  TempSrc: Oral Oral Oral Oral  SpO2: 95% 100% 96% 94%  Weight:      Height:       No intake or output data in the 24 hours ending 11/16/15 0831 Filed Weights   11/14/15 2022  Weight: 96.4 kg (212 lb 9.6 oz)    Exam: General exam: No acute distress. Respiratory system: Clear to auscultation. Respiratory effort normal. Cardiovascular system: S1 & S2 heard, RRR. No JVD,  rubs, gallops or  clicks. No murmurs. Gastrointestinal system: Abdomen is nondistended, soft , tender to palpation. No organomegaly or  masses felt. Normal bowel sounds heard. Central nervous system: Alert and oriented. No focal neurological deficits. Extremities: No clubbing,  or cyanosis. No edema. Skin: No rashes, lesions or ulcers. Psychiatry: Judgement and insight appear decreased. Mood & affect depressed/anxious.   Data Reviewed:   I have personally reviewed following labs and imaging studies:  Labs: Basic Metabolic Panel: No results for input(s): NA, K, CL, CO2, GLUCOSE, BUN, CREATININE, CALCIUM, MG, PHOS in the last 168 hours. GFR CrCl cannot be calculated (Patient's most recent lab result is older than the maximum 21 days allowed.). Liver Function Tests: No results for input(s): AST, ALT, ALKPHOS, BILITOT, PROT, ALBUMIN in the last 168 hours. No results for input(s): LIPASE, AMYLASE in the last 168 hours. No results for input(s): AMMONIA in the last 168 hours. Coagulation profile No results for input(s): INR, PROTIME in the last 168 hours.  CBC: No results for input(s): WBC, NEUTROABS, HGB, HCT, MCV, PLT in the last 168 hours. Cardiac Enzymes:  Recent Labs Lab 11/15/15 1215 11/15/15 1807 11/16/15 0003  TROPONINI <0.03 <0.03 <0.03   BNP (last 3 results) No results for input(s): PROBNP in the last 8760 hours. CBG: No results for input(s): GLUCAP in the last 168 hours. D-Dimer: No results for input(s): DDIMER in the last 72 hours. Hgb A1c: No results for input(s): HGBA1C in the last 72 hours. Lipid Profile: No results for input(s): CHOL, HDL, LDLCALC, TRIG, CHOLHDL, LDLDIRECT in the last 72 hours. Thyroid function studies: No results for input(s): TSH, T4TOTAL, T3FREE, THYROIDAB in the last 72 hours.  Invalid input(s): FREET3 Anemia work up: No results for input(s): VITAMINB12, FOLATE, FERRITIN, TIBC, IRON, RETICCTPCT in the last 72 hours. Sepsis Labs: No results for input(s): PROCALCITON, WBC, LATICACIDVEN in the last 168 hours.  Microbiology No results found for this or any previous visit  (from the past 240 hour(s)).  Radiology: Mr Jeri Cos Wo Contrast  Result Date: 11/15/2015 CLINICAL DATA:  Altered mental status, possible seizures. Unresponsive in the emergency department. History of pseudoseizures. History of panic attacks and ADHD. EXAM: MRI HEAD WITHOUT AND WITH CONTRAST TECHNIQUE: Multiplanar, multiecho pulse sequences of the brain and surrounding structures were obtained without and with intravenous contrast. CONTRAST:  60mL MULTIHANCE GADOBENATE DIMEGLUMINE 529 MG/ML IV SOLN COMPARISON:  CT head 11/14/2015 within normal limits. MR brain 08/16/2015. CTA head neck 10/09/2015. FINDINGS: The patient was unable to remain motionless for the exam. Small or subtle lesions could be overlooked. Brain: No acute stroke, acute hemorrhage, mass lesion, hydrocephalus, or extra-axial fluid. Prior Chiari decompression. No cerebellar tonsillar impaction or cervicomedullary compression. Small focus of RIGHT inferior susceptibility on gradient sequence, could be artifactual, could reflect previous surgical trauma, or may be incidental, stable from prior MR. No visible upper cervical hydromyelia. Overall cerebral volume appears normal. No temporal lobe asymmetry on dedicated coronal imaging. RIGHT frontal subcortical white matter focus, stable, likely chronic ischemia. Post infusion, no abnormal enhancement. Vascular: Normal flow voids.  LEFT vertebral dominant. Skull and upper cervical spine: Normal marrow signal. Previous suboccipital decompression for Chiari. Sinuses/Orbits: Negative. Other: None. IMPRESSION: Markedly motion degraded exam demonstrating no acute intracranial abnormality. Within limits for comparison with priors, no interval change. Status post suboccipital decompression for Chiari malformation, without residual cervicomedullary compression. Electronically Signed   By: Staci Righter M.D.   On: 11/15/2015 07:59    Medications:   . amLODipine  10 mg Oral Daily  .  aspirin EC  81 mg Oral  Daily  . citalopram  40 mg Oral Daily  . clonazePAM  0.25 mg Oral TID  . cloNIDine  0.1 mg Oral BID  . diltiazem  240 mg Oral Daily  . divalproex  250 mg Oral Q8H  . enoxaparin (LOVENOX) injection  40 mg Subcutaneous Q24H  . gabapentin  400 mg Oral BID  . prazosin  2 mg Oral QHS   Continuous Infusions:   The patient is at moderate risk for deterioration with moderate decision making. Level 2 visit.    LOS: 1 day   RAMA,CHRISTINA  [facility] Pager [phone]. If unable to reach me by pager, please call my cell phone at [phone].  *Please refer to [email or web address], password [email or web address] to get updated schedule on who will round on this patient, as hospitalists switch teams weekly. If 7PM-7AM, please contact night-coverage at [email or web address], password [email or web address] for any overnight needs.  11/16/2015, 8:31 AM

## 2015-11-16 NOTE — Consult Note (Signed)
[facility] CM Primary Care Navigator  11/16/2015  Meredith Cherry 07/07/1970 [id number]  Met with patient at the bedside to identify possible discharge needs.  Patient states having chest pains and headaches that led to this admission.  She endorses Dr. Janace Cherry with [facility]) as the primary care provider.   Plan is to discharge to home when cleared by psychiatry per MD.  She shared using [facility] for regular prescriptions and [facility] pharmacy for local needs/ obtaining medications. She mentioned having issues obtaining/ affording psychiatric prescriptions before but able to get extra assistance with medications through Medicaid as stated. She verbalized managing her own medications using the "pill box system". She mentioned being "forgetful" which results to inability to take medications at times but she agreed to set up the alarm on her phone to remind her of the times to take her medications when at home.  She provides transportation to self in going to doctors' appointments as stated. Discussed with patient regarding transportation benefits with [employer].  Patient is the primary caregiver for self at home since her husband Meredith Cherry) works.  Patient had expressed interest for a list of transportation resources in her area as well as care agency resources in case she will be needing it after discharge. [facility] care management assistant was notified of patient's request and will mail resource information to patient's address as stated.  Patient expressed understanding to call primary care provider's office once discharged, for a post discharge follow-up appointment within a week or sooner if needs arise. Patient letter provided as a reminder.  For additional questions please contact:  Meredith Cherry, BSN, RN-BC [facility] PRIMARY CARE Navigator Cell: [phone]

## 2015-11-17 DIAGNOSIS — F410 Panic disorder [episodic paroxysmal anxiety] without agoraphobia: Secondary | ICD-10-CM | POA: Diagnosis not present

## 2015-11-17 DIAGNOSIS — F4312 Post-traumatic stress disorder, chronic: Secondary | ICD-10-CM | POA: Diagnosis not present

## 2015-11-17 DIAGNOSIS — F909 Attention-deficit hyperactivity disorder, unspecified type: Secondary | ICD-10-CM | POA: Diagnosis not present

## 2015-11-17 DIAGNOSIS — I4581 Long QT syndrome: Secondary | ICD-10-CM | POA: Diagnosis not present

## 2015-11-17 DIAGNOSIS — F17210 Nicotine dependence, cigarettes, uncomplicated: Secondary | ICD-10-CM | POA: Diagnosis not present

## 2015-11-17 DIAGNOSIS — R9431 Abnormal electrocardiogram [ECG] [EKG]: Secondary | ICD-10-CM

## 2015-11-17 DIAGNOSIS — I10 Essential (primary) hypertension: Secondary | ICD-10-CM

## 2015-11-17 DIAGNOSIS — F329 Major depressive disorder, single episode, unspecified: Secondary | ICD-10-CM | POA: Diagnosis not present

## 2015-11-17 DIAGNOSIS — F332 Major depressive disorder, recurrent severe without psychotic features: Secondary | ICD-10-CM | POA: Diagnosis not present

## 2015-11-17 DIAGNOSIS — G4089 Other seizures: Secondary | ICD-10-CM | POA: Diagnosis not present

## 2015-11-17 DIAGNOSIS — K219 Gastro-esophageal reflux disease without esophagitis: Secondary | ICD-10-CM

## 2015-11-17 DIAGNOSIS — F458 Other somatoform disorders: Secondary | ICD-10-CM | POA: Diagnosis not present

## 2015-11-17 DIAGNOSIS — Z87798 Personal history of other (corrected) congenital malformations: Secondary | ICD-10-CM | POA: Diagnosis not present

## 2015-11-17 DIAGNOSIS — F419 Anxiety disorder, unspecified: Secondary | ICD-10-CM

## 2015-11-17 DIAGNOSIS — F445 Conversion disorder with seizures or convulsions: Secondary | ICD-10-CM | POA: Diagnosis not present

## 2015-11-17 DIAGNOSIS — Z8669 Personal history of other diseases of the nervous system and sense organs: Secondary | ICD-10-CM

## 2015-11-17 MED ORDER — DIVALPROEX SODIUM 500 MG PO DR TAB: 500.0000 mg | DELAYED_RELEASE_TABLET | Freq: Two times a day (BID) | ORAL | 0 refills | Status: DC

## 2015-11-17 MED ORDER — ASPIRIN 81 MG PO TBEC: 81.0000 mg | DELAYED_RELEASE_TABLET | Freq: Every day | ORAL | 0 refills | Status: DC

## 2015-11-17 MED ORDER — DIVALPROEX SODIUM 500 MG PO DR TAB: 500.0000 mg | DELAYED_RELEASE_TABLET | Freq: Two times a day (BID) | ORAL | Status: DC

## 2015-11-17 MED ORDER — CLONAZEPAM 0.5 MG PO TABS: 0.5000 mg | ORAL_TABLET | Freq: Three times a day (TID) | ORAL | 0 refills | Status: DC

## 2015-11-17 MED ORDER — GABAPENTIN 400 MG PO CAPS
400.0000 mg | ORAL_CAPSULE | Freq: Three times a day (TID) | ORAL | Status: DC
  Administered 2015-11-17: 400 mg via ORAL
  Filled 2015-11-17: qty 1

## 2015-11-17 MED ORDER — GABAPENTIN 400 MG PO CAPS: 400.0000 mg | ORAL_CAPSULE | Freq: Three times a day (TID) | ORAL | 0 refills | Status: DC

## 2015-11-17 MED ORDER — PRAZOSIN HCL 2 MG PO CAPS
2.0000 mg | ORAL_CAPSULE | Freq: Every day | ORAL | 0 refills | Status: DC
Start: 2015-11-17 — End: 2017-03-02

## 2015-11-17 NOTE — Consult Note (Signed)
[facility] Face-to-Face Psychiatry Consult Follow Up  Reason for Consult:  Pseudoseizure vs conversion disorder Referring Physician:  Dr. Rockne Menghini Patient Identification: Meredith Cherry MRN:  [id number] Principal Diagnosis: Pseudoseizures Diagnosis:   Patient Active Problem List   Diagnosis Date Noted  . History of Chiari malformation [Z86.69] 11/15/2015  . 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 Prolonged QT interval [R94.31] 11/15/2015  . Panic attack [F41.0]   . Hypertension [I10]   . GERD (gastroesophageal reflux disease) [K21.9]   . Depression [F32.9]   . Anxiety [F41.9]   . ADHD (attention deficit hyperactivity disorder) [F90.9]   . Acute encephalopathy [G93.40]   . 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 Snoring [R06.83] 12/07/2011    Total Time spent with patient: 1 hour  Subjective:   Meredith Cherry is a 45 y.o. female patient admitted with AMS and questionable pseudoseizures.Marland Kitchen  HPI:  Meredith Cherry is an 45 y.o. female, seen and chart reviewed for this face-to-face psychiatric consultation and evaluation of pseudoseizures. Patient has received extensive neurological evaluation including MRI scan of the brain and electroencephalogram which indicated no seizure activity or any other trauma. Patient endorses excessive anxiety, overwhelming, shaking, nonepileptic seizures and panic episodes with the chest tightness and cannot breathe. Patient reported she is under significant stress because her husband has decided to sell their home and leave her goal with her 2 children who are teenagers. Patient stated she has been disabled and her husband is paying for her. Patient is worried she may not even have any thing to care for herself but husband is gone she feels it is hard for her to deal by herself without any help. Patient denies manic symptoms, auditory/visual hallucinations, paranoia, suicidal/homicidal ideation, intention or plans. We will reintroduce patient medication with the  adjustments for better control of her depression and anxiety including panic episodes.   Medical history: Patient with  history of depression, anxiety with panic attacks, hypertension, attention deficit disorder, asthma and Chiari malformation type I, transferred from [facility] for neurologic evaluation for altered mental status and possible seizure activity. Patient presented to the ED apparently unresponsive. Possible seizure activity was consideration. Teleneurologist felt that patient may have had a seizure and could possibly be an status, and recommended transfer to a facility with neurology service. Patient was given 1 mg of Ativan in the ED but no other medications for suspected seizure activity. She has become progressively more responsive and alert. No seizure activity was reported in transport nor since she has arrived here. Laboratory studies were unremarkable except for borderline low magnesium level. She has no documented history of seizure activity.  Past Psychiatric History: depression, anxiety, ADHD and pseudoseizures.  Interval History: Patient appeared in her bed, calm and cooperative. She feels somewhat better since last visit and doing fine with medication and no adverse effects. Reportedly she has one or more episodes of pseudoseizures. She has improved sleep and appetite. She continue to talk with her husband and called child pharmacy to fix the prescription as her husband asked her and she is some what upset about it and said he would have called the pharmacy by himself. Reportedly he has plans of going out of town when she goes home and she wonder how she is going to manage children schedule without a vehicle. She also states if he goes to out of town on work, usually takes his work related vehicle. She is quick to get stressed about every little thing husband says without properly processing the information or  obtaining more details needed. Will adjust medication as discussed  with patient and needed for Qtc prolongation which is in 567. Her seroquel has been discontinue and now celexa needs to be discontinued.    Risk to Self: Is patient at risk for suicide?: No Risk to Others:   Prior Inpatient Therapy:   Prior Outpatient Therapy:    Past Medical History:  Past Medical History:  Diagnosis Date  . ADHD (attention deficit hyperactivity disorder)   . Allergic rhinitis   . Anxiety   . Asthma   . Chiari malformation type I ([place])   . Chronic migraine   . Depression   . Diabetes mellitus without complication ([place])   . GERD (gastroesophageal reflux disease)   . Hypertension   . Panic attack   . Psychiatric disorder     Past Surgical History:  Procedure Laterality Date  . CHOLECYSTECTOMY    . TUBAL LIGATION     Family History:  Family History  Problem Relation Age of Onset  . Stroke Mother     x 5  . Hypertension Mother   . Heart disease Mother   . Diabetes Mother   . Diabetes Father   . Heart disease Maternal Grandmother   . Breast cancer Paternal Grandmother   . Asthma Brother    Family Psychiatric  History: Reportedly patient has been diagnosed with the posttraumatic stress disorder since he has a multiple childhood traumas from patient's   Social history:  History  Alcohol Use No     History  Drug Use No    Social History   Social History  . Marital status: Unknown    Spouse name: N/A  . Number of children: 2  . Years of education: N/A   Occupational History  . unemployed    Social History Main Topics  . Smoking status: Current Every Day Smoker    Types: Cigarettes  . Smokeless tobacco: None  . Alcohol use No  . Drug use: No  . Sexual activity: Not Asked   Other Topics Concern  . None   Social History Narrative  . None   Additional Social History:    Allergies:   Allergies  Allergen Reactions  . Bactrim [Sulfamethoxazole-Trimethoprim] Other (See Comments)    Unknown  . Ciprofloxacin Rash  . Penicillins Other  (See Comments)    Unknown  . Sulfa Antibiotics Rash    Labs:  Results for orders placed or performed during the hospital encounter of 11/14/15 (from the past 48 hour(s))  Troponin I (q 6hr x 3)     Status: None   Collection Time: 11/15/15  6:07 PM  Result Value Ref Range   Troponin I <0.03 <0.03 ng/mL  Troponin I (q 6hr x 3)     Status: None   Collection Time: 11/16/15 12:03 AM  Result Value Ref Range   Troponin I <0.03 <0.03 ng/mL    Current Facility-Administered Medications  Medication Dose Route Frequency Provider Last Rate Last Dose  . albuterol (PROVENTIL) (2.5 MG/3ML) 0.083% nebulizer solution 2.5 mg  2.5 mg Nebulization Q6H PRN Christina P Rama, MD      . amLODipine (NORVASC) tablet 10 mg  10 mg Oral Daily Venetia Maxon Rama, MD   10 mg at 11/17/15 1022  . aspirin EC tablet 81 mg  81 mg Oral Daily Venetia Maxon Rama, MD   81 mg at 11/17/15 1022  . citalopram (CELEXA) tablet 40 mg  40 mg Oral Daily Ambrose Finland, MD  40 mg at 11/17/15 1022  . clonazePAM (KLONOPIN) tablet 0.5 mg  0.5 mg Oral TID Venetia Maxon Rama, MD   0.5 mg at 11/17/15 1022  . cloNIDine (CATAPRES) tablet 0.1 mg  0.1 mg Oral BID Venetia Maxon Rama, MD   0.1 mg at 11/17/15 1022  . diltiazem (CARDIZEM CD) 24 hr capsule 240 mg  240 mg Oral Daily Venetia Maxon Rama, MD   240 mg at 11/17/15 1022  . divalproex (DEPAKOTE) DR tablet 250 mg  250 mg Oral Q8H Ambrose Finland, MD   250 mg at 11/17/15 1346  . enoxaparin (LOVENOX) injection 40 mg  40 mg Subcutaneous Q24H Etta Quill, DO   40 mg at 11/16/15 2316  . gabapentin (NEURONTIN) capsule 400 mg  400 mg Oral BID Ambrose Finland, MD   400 mg at 11/17/15 1022  . gi cocktail (Maalox,Lidocaine,Donnatal)  30 mL Oral TID PRN Venetia Maxon Rama, MD      . ibuprofen (ADVIL,MOTRIN) tablet 400 mg  400 mg Oral Q6H PRN Christina P Rama, MD      . prazosin (MINIPRESS) capsule 2 mg  2 mg Oral QHS Ambrose Finland, MD   2 mg at 11/16/15 2317     Musculoskeletal: Strength & Muscle Tone: decreased Gait & Station: unable to stand Patient leans: N/A  Psychiatric Specialty Exam: Physical Exam  as per history and physical   ROS  complaining about overwhelming anxiety, panic episodes, chest pain, depression, dysphoric and shaking on her extremities. No Fever-chills, No Headache, No changes with Vision or hearing, reports vertigo No problems swallowing food or Liquids, No Chest pain, Cough or Shortness of Breath, No Abdominal pain, No Nausea or Vommitting, Bowel movements are regular, No Blood in stool or Urine, No dysuria, No new skin rashes or bruises, No new joints pains-aches,  No new weakness, tingling, numbness in any extremity, No recent weight gain or loss, No polyuria, polydypsia or polyphagia,   A full 10 point Review of Systems was done, except as stated above, all other Review of Systems were negative.  Blood pressure (!) 123/52, pulse (!) 101, temperature 98.3 F (36.8 C), temperature source Oral, resp. rate 16, height 5\' 6"  (1.676 m), weight 96.4 kg (212 lb 9.6 oz), SpO2 97 %.Body mass index is 34.31 kg/m.  General Appearance: Bizarre, Disheveled and Guarded  Eye Contact:  Good  Speech:  Slow  Volume:  Decreased  Mood:  Anxious and Dysphoric  Affect:  Depressed and Tearful  Thought Process:  Coherent and Goal Directed  Orientation:  Full (Time, Place, and Person)  Thought Content:  Rumination  Suicidal Thoughts:  No  Homicidal Thoughts:  No  Memory:  Immediate;   Good Recent;   Fair Remote;   Fair  Judgement:  Impaired  Insight:  Fair  Psychomotor Activity:  Decreased and Restlessness  Concentration:  Concentration: Fair and Attention Span: Fair  Recall:  Good  Fund of Knowledge:  Good  Language:  Good  Akathisia:  Negative  Handed:  Right  AIMS (if indicated):     Assets:  Communication Skills Desire for Improvement Leisure Time Resilience  ADL's:  Impaired  Cognition:  WNL  Sleep:         Treatment Plan Summary: 45 years old female with a history of bipolar disorder with most recent episode of major depressive disorder, recurrent, posttraumatic stress disorder presented with overwhelming anxiety, panic episodes, depression, dysphoria and multiple episodes of pseudoseizures. Neurological evaluation is negative for neuropathology in terms of  head pathology and negative for seizure episodes in EEG. Patient is also denies active suicidal/homicidal ideation, intention or plans. Patient has no evidence of psychosis.   Daily contact with patient to assess and evaluate symptoms and progress in treatment and Medication management    Medication recommendation:  Change Depakote DR 500 mg 2 times daily for mood stabilization and pseudoseizures Check valproic acid in am for therapeutic level monitoring Discontinue Celexa 40 mg due to prolonged QTC Avoid antipsychotic due to the prolonged QTC Prazosin 2 mg at bedtime for nightmares Change Gabapentin 400 mg three times daily for generalized anxiety and mood Klonopin disintegrating tablet 0.5 mg 3 times daily for panic episodes  Refer to the unit social service regarding psychosocial support required   Appreciate psychiatric consultation and follow up as clinically required Please contact [phone] or [phone] if needs further assistance   Disposition: No evidence of imminent risk to self or others at present.   Patient does not meet criteria for psychiatric inpatient admission. Supportive therapy provided about ongoing stressors.  Ambrose Finland, MD 11/17/2015 2:22 PM

## 2015-11-17 NOTE — Care Management Note (Signed)
Case Management Note  Patient Details  Name: Meredith Cherry MRN: [id number] Date of Birth: [birth year]  Subjective/Objective:                    Action/Plan: Pt discharging home with her husband. Pt needing assistance in obtaining appointment with her primary Psychiatrist Dr Erling Cruz at [facility]. CM called and left a message with the scheduling person "Vaughan Basta" about needing an appointment for the pt ASAP. Behavioral health scheduling to call CM or the patient with the information.   Expected Discharge Date:  11/16/15               Expected Discharge Plan:  Home/Self Care  In-House Referral:     Discharge planning Services  CM Consult  Post Acute Care Choice:    Choice offered to:     DME Arranged:    DME Agency:     HH Arranged:    HH Agency:     Status of Service:  Completed, signed off  If discussed at [employer] of Stay Meetings, dates discussed:    Additional Comments:  Pollie Friar, RN 11/17/2015, 4:37 PM

## 2015-11-17 NOTE — Progress Notes (Signed)
Pt discharged at this time with husband taking all personal belongings. IV discontinued, dry dressing applied. Discharge instructions provided with prescription with verbal understanding. No noted distress.

## 2015-11-17 NOTE — Discharge Summary (Signed)
Physician Discharge Summary  [name] [id number] DOB: [birth year] DOA: 11/14/2015  PCP: Myrlene Broker, MD  Admit date: 11/14/2015 Discharge date: 11/17/2015  Admitted From: Home Disposition:  Home  Recommendations for Outpatient Follow-up:  1. Follow up with PCP Dr. Unk Lightning in 1-2 weeks 2. Follow up with Psychiatry Dr. Lendon Colonel in 1 week 3. Repeat EKG at PCP's office 4. Please obtain BMP/CBC in one week  Home Health: No Equipment/Devices: None  Discharge Condition: Guarded CODE STATUS: Full Diet recommendation: Heart Healthy / Carb Modified   Brief/Interim Summary: Meredith Cherry is an 45 y.o. female with a PMH of a fairly extensive psychiatric history including somatization disorder, conversion disorder, depression, anxiety with panic attacks, ADHD and recurrent hospitalizations for various somatic complaints, most recently hospitalized a few days ago for evaluation of nausea/vomiting, transferred from [facility] for evaluation of altered mental status/unresponsiveness with possible seizure activity on 11/14/15 with request for neurology consultation. Evaluated by Dr. Nicole Kindred and found to have no focal deficits on neurological exam. She was worked up and had a Psychiatric Evaluation and the Psychiatrist recommended outpatient follow up. At this time she is ready for D/C where she will follow up closely with her PCP and with her primary Psychiatrist Dr. Lendon Colonel.    Discharge Diagnoses:  Principal Problem:   Pseudoseizures Active Problems:   History of Chiari malformation   Panic attack   Hypertension   GERD (gastroesophageal reflux disease)   Depression   Anxiety   ADHD (attention deficit hyperactivity disorder)   Psychiatric pseudoseizure   Prolonged QT interval  Pseudoseizures -Evaluated by Neurologist Dr. Nicole Kindred. -MRI stable when compared to prior.  -EEG negative.  -The patient has a long-standing history of psychiatric illness, and her  presenting symptoms are most consistent with her known history of conversion disorder/somatization.  -Evaluated by Psychiatrist 11/15/15 and 11/17/15. Medications adjusted. Not felt to need inpatient psychiatric stabilization. Recommended follow up with Primary Psychiatrist.  -Social Worker consulted to set up appropriate outpatient therapy.  Prolonged QT interval -Monitored on Telemetry.  -Patient is on multiple medications that may prolong the QT interval. These include Celexa, albuterol, and Dulera.  -Seroquel and Dulera have been stopped and Psychiatry has stopped Celexa today. -Repeat EKG today showed QTC of 492 -Patient to have Repeat EKG as an outpatient and discuss with Dr. Erling Cruz about medication adjustments.   History of Chiari malformation Status post suboccipital decompression for Chiari malformation, without residual cervicomedullary compression.  Depression/anxiety/ADHD/somatization disorder/conversion disorder Panic attack -Psychiatry consultation performed.  -Medications adjusted by Dr. Louretta Shorten and include: Celexa, Klonopin, Depakote.  -Discussed Case with Dr. Louretta Shorten and recommended follow up with primary Psychiatry and close follow up with PCP Dr. Unk Lightning.   Hypertension -Continue Home Norvasc Catapres, Cardizem.  Asthma -Continue PRN albuterol.  Diabetes -Continue home Metformin  Discharge Instructions  Discharge Instructions    Call MD for:  difficulty breathing, headache or visual disturbances    Complete by:  As directed    Call MD for:  persistant nausea and vomiting    Complete by:  As directed    Diet - low sodium heart healthy    Complete by:  As directed    Discharge instructions    Complete by:  As directed    Take all medications as prescribed. Follow up with PCP and Psychiatry Dr. Erling Cruz within 1 week. If symptoms worsen or change please return to ER for evaluation.   Increase activity slowly    Complete by:  As directed  Medication List    STOP taking these medications   citalopram 40 MG tablet Commonly known as:  CELEXA   DULERA 100-5 MCG/ACT Aero Generic drug:  mometasone-formoterol   LORazepam 1 MG tablet Commonly known as:  ATIVAN   predniSONE 20 MG tablet Commonly known as:  DELTASONE   QUEtiapine 300 MG tablet Commonly known as:  SEROQUEL   tiZANidine 4 MG tablet Commonly known as:  ZANAFLEX   traZODone 50 MG tablet Commonly known as:  DESYREL     TAKE these medications   albuterol (2.5 MG/3ML) 0.083% nebulizer solution Commonly known as:  PROVENTIL Take 2.5 mg by nebulization every 6 (six) hours as needed for wheezing or shortness of breath.   amLODipine 10 MG tablet Commonly known as:  NORVASC Take 10 mg by mouth daily.   aspirin 81 MG EC tablet Take 1 tablet (81 mg total) by mouth daily. Start taking on:  11/18/2015   clonazePAM 0.5 MG tablet Commonly known as:  KLONOPIN Take 1 tablet (0.5 mg total) by mouth 3 (three) times daily.   cloNIDine 0.1 MG tablet Commonly known as:  CATAPRES Take 0.1 mg by mouth 2 (two) times daily.   dicyclomine 20 MG tablet Commonly known as:  BENTYL Take 20 mg by mouth daily.   diltiazem 240 MG 24 hr capsule Commonly known as:  CARDIZEM CD Take 240 mg by mouth daily.   divalproex 500 MG DR tablet Commonly known as:  DEPAKOTE Take 1 tablet (500 mg total) by mouth every 12 (twelve) hours. What changed:  when to take this   EPINEPHrine 0.15 MG/0.15ML injection Commonly known as:  EPIPEN JR Inject 0.15 mg into the muscle as needed for anaphylaxis.   gabapentin 400 MG capsule Commonly known as:  NEURONTIN Take 1 capsule (400 mg total) by mouth 3 (three) times daily. What changed:  how much to take   ibuprofen 200 MG tablet Commonly known as:  ADVIL,MOTRIN Take 400 mg by mouth every 6 (six) hours as needed for headache or moderate pain.   losartan 100 MG tablet Commonly known as:  COZAAR Take 100 mg by mouth daily.    metFORMIN 1000 MG tablet Commonly known as:  GLUCOPHAGE Take 1,000 mg by mouth 2 (two) times daily.   metoCLOPramide 10 MG tablet Commonly known as:  REGLAN Take 10 mg by mouth every 6 (six) hours as needed for nausea or vomiting.   montelukast 10 MG tablet Commonly known as:  SINGULAIR Take 10 mg by mouth daily.   nitroGLYCERIN 0.4 MG SL tablet Commonly known as:  NITROSTAT Place 0.4 mg under the tongue.   omeprazole 20 MG capsule Commonly known as:  PRILOSEC Take 20 mg by mouth daily.   Potassium 99 MG Tabs Take 99 mg by mouth daily.   pravastatin 20 MG tablet Commonly known as:  PRAVACHOL Take 20 mg by mouth every morning.   prazosin 2 MG capsule Commonly known as:  MINIPRESS Take 1 capsule (2 mg total) by mouth at bedtime. What changed:  how much to take   ranitidine 150 MG tablet Commonly known as:  ZANTAC Take 150 mg by mouth at bedtime.   SUMAtriptan 100 MG tablet Commonly known as:  IMITREX Take 100 mg by mouth.   topiramate 100 MG tablet Commonly known as:  TOPAMAX Take 100 mg by mouth daily as needed (headache).      Follow-up Information    ROBBINS,ROBERT A, MD. Schedule an appointment as soon as possible for a  visit today.   Specialty:  Family Medicine Contact information: [address] [place] [address]        Lendon Colonel, MD. Schedule an appointment as soon as possible for a visit today.   Specialty:  Psychiatry Why:  Follow up in 1 week.  Contact information: [place] [address] [phone]          Allergies  Allergen Reactions  . Bactrim [Sulfamethoxazole-Trimethoprim] Other (See Comments)    Unknown  . Ciprofloxacin Rash  . Penicillins Other (See Comments)    Unknown  . Sulfa Antibiotics Rash   Consultations:  Psychiatry  Procedures/Studies: Mr Jeri Cos Wo Contrast  Result Date: 11/15/2015 CLINICAL DATA:  Altered mental status, possible seizures. Unresponsive in the emergency department.  History of pseudoseizures. History of panic attacks and ADHD. EXAM: MRI HEAD WITHOUT AND WITH CONTRAST TECHNIQUE: Multiplanar, multiecho pulse sequences of the brain and surrounding structures were obtained without and with intravenous contrast. CONTRAST:  20mL MULTIHANCE GADOBENATE DIMEGLUMINE 529 MG/ML IV SOLN COMPARISON:  CT head 11/14/2015 within normal limits. MR brain 08/16/2015. CTA head neck 10/09/2015. FINDINGS: The patient was unable to remain motionless for the exam. Small or subtle lesions could be overlooked. Brain: No acute stroke, acute hemorrhage, mass lesion, hydrocephalus, or extra-axial fluid. Prior Chiari decompression. No cerebellar tonsillar impaction or cervicomedullary compression. Small focus of RIGHT inferior susceptibility on gradient sequence, could be artifactual, could reflect previous surgical trauma, or may be incidental, stable from prior MR. No visible upper cervical hydromyelia. Overall cerebral volume appears normal. No temporal lobe asymmetry on dedicated coronal imaging. RIGHT frontal subcortical white matter focus, stable, likely chronic ischemia. Post infusion, no abnormal enhancement. Vascular: Normal flow voids.  LEFT vertebral dominant. Skull and upper cervical spine: Normal marrow signal. Previous suboccipital decompression for Chiari. Sinuses/Orbits: Negative. Other: None. IMPRESSION: Markedly motion degraded exam demonstrating no acute intracranial abnormality. Within limits for comparison with priors, no interval change. Status post suboccipital decompression for Chiari malformation, without residual cervicomedullary compression. Electronically Signed   By: Staci Righter M.D.   On: 11/15/2015 07:59    Subjective: Patient was seen and examined at bedside and she complained of mild abdominal pain. Was tearful but wanted to go home. Discussed the case with Psychiatry and repeated EKG for Qtc interval which was improved. She denied any other active complaints and stated  she will follow up closely with her primary Psychiatrist.   Discharge Exam: Vitals:   11/17/15 0529 11/17/15 0837  BP: 107/61 (!) 123/52  Pulse: 86 (!) 101  Resp: 18 16  Temp: 97.9 F (36.6 C) 98.3 F (36.8 C)   Vitals:   11/16/15 2206 11/17/15 0100 11/17/15 0529 11/17/15 0837  BP: 116/74 102/63 107/61 (!) 123/52  Pulse: 99 93 86 (!) 101  Resp: 18 18 18 16   Temp: 97.8 F (36.6 C) 98.6 F (37 C) 97.9 F (36.6 C) 98.3 F (36.8 C)  TempSrc: Oral Oral Oral Oral  SpO2: 99% 93% 95% 97%  Weight:      Height:       General: Pt is alert, awake, not in acute distress Cardiovascular: RRR, S1/S2 +, no rubs, no gallops Respiratory: CTA bilaterally, no wheezing, no rhonchi Abdominal: Soft, NT, ND, bowel sounds + Extremities: no edema, no cyanosis Psychiatry: tearful mood and affect.   The results of significant diagnostics from this hospitalization (including imaging, microbiology, ancillary and laboratory) are listed below for reference.    Microbiology: No results found for this  or any previous visit (from the past 240 hour(s)).   Labs: BNP (last 3 results) No results for input(s): BNP in the last 8760 hours. Basic Metabolic Panel: No results for input(s): NA, K, CL, CO2, GLUCOSE, BUN, CREATININE, CALCIUM, MG, PHOS in the last 168 hours. Liver Function Tests: No results for input(s): AST, ALT, ALKPHOS, BILITOT, PROT, ALBUMIN in the last 168 hours. No results for input(s): LIPASE, AMYLASE in the last 168 hours. No results for input(s): AMMONIA in the last 168 hours. CBC: No results for input(s): WBC, NEUTROABS, HGB, HCT, MCV, PLT in the last 168 hours. Cardiac Enzymes:  Recent Labs Lab 11/15/15 1215 11/15/15 1807 11/16/15 0003  TROPONINI <0.03 <0.03 <0.03   BNP: Invalid input(s): POCBNP CBG: No results for input(s): GLUCAP in the last 168 hours. D-Dimer No results for input(s): DDIMER in the last 72 hours. Hgb A1c No results for input(s): HGBA1C in the last 72  hours. Lipid Profile No results for input(s): CHOL, HDL, LDLCALC, TRIG, CHOLHDL, LDLDIRECT in the last 72 hours. Thyroid function studies No results for input(s): TSH, T4TOTAL, T3FREE, THYROIDAB in the last 72 hours.  Invalid input(s): FREET3 Anemia work up No results for input(s): VITAMINB12, FOLATE, FERRITIN, TIBC, IRON, RETICCTPCT in the last 72 hours. Urinalysis No results found for: COLORURINE, APPEARANCEUR, LABSPEC, [place], GLUCOSEU, HGBUR, BILIRUBINUR, KETONESUR, PROTEINUR, UROBILINOGEN, NITRITE, LEUKOCYTESUR Sepsis Labs Invalid input(s): PROCALCITONIN,  WBC,  LACTICIDVEN Microbiology No results found for this or any previous visit (from the past 240 hour(s)).  Time coordinating discharge: Over 30 minutes  SIGNED:  Kerney Elbe, DO [facility] 11/17/2015, 4:26 PM Pager [phone]  If 7PM-7AM, please contact night-coverage [email or web address] Password [email or web address]

## 2015-11-19 DIAGNOSIS — R0602 Shortness of breath: Secondary | ICD-10-CM | POA: Diagnosis not present

## 2015-11-19 DIAGNOSIS — G43709 Chronic migraine without aura, not intractable, without status migrainosus: Secondary | ICD-10-CM | POA: Diagnosis not present

## 2015-11-19 DIAGNOSIS — R0789 Other chest pain: Secondary | ICD-10-CM | POA: Diagnosis not present

## 2015-11-19 DIAGNOSIS — F450 Somatization disorder: Secondary | ICD-10-CM | POA: Diagnosis not present

## 2015-11-19 DIAGNOSIS — R55 Syncope and collapse: Secondary | ICD-10-CM | POA: Diagnosis not present

## 2015-11-22 DIAGNOSIS — J4540 Moderate persistent asthma, uncomplicated: Secondary | ICD-10-CM | POA: Diagnosis not present

## 2015-11-23 DIAGNOSIS — J45909 Unspecified asthma, uncomplicated: Secondary | ICD-10-CM | POA: Diagnosis not present

## 2015-11-23 DIAGNOSIS — F445 Conversion disorder with seizures or convulsions: Secondary | ICD-10-CM | POA: Diagnosis not present

## 2015-11-23 DIAGNOSIS — R404 Transient alteration of awareness: Secondary | ICD-10-CM | POA: Diagnosis not present

## 2015-11-23 DIAGNOSIS — R531 Weakness: Secondary | ICD-10-CM | POA: Diagnosis not present

## 2015-11-23 DIAGNOSIS — G894 Chronic pain syndrome: Secondary | ICD-10-CM | POA: Diagnosis not present

## 2015-11-23 DIAGNOSIS — F319 Bipolar disorder, unspecified: Secondary | ICD-10-CM | POA: Diagnosis not present

## 2015-11-23 DIAGNOSIS — I10 Essential (primary) hypertension: Secondary | ICD-10-CM | POA: Diagnosis not present

## 2015-11-23 DIAGNOSIS — J45998 Other asthma: Secondary | ICD-10-CM | POA: Diagnosis not present

## 2015-11-23 DIAGNOSIS — E119 Type 2 diabetes mellitus without complications: Secondary | ICD-10-CM | POA: Diagnosis not present

## 2015-11-23 DIAGNOSIS — R079 Chest pain, unspecified: Secondary | ICD-10-CM | POA: Diagnosis not present

## 2015-11-23 DIAGNOSIS — R55 Syncope and collapse: Secondary | ICD-10-CM | POA: Diagnosis not present

## 2015-11-23 DIAGNOSIS — G935 Compression of brain: Secondary | ICD-10-CM | POA: Diagnosis not present

## 2015-11-23 DIAGNOSIS — R569 Unspecified convulsions: Secondary | ICD-10-CM | POA: Diagnosis not present

## 2015-11-23 DIAGNOSIS — R51 Headache: Secondary | ICD-10-CM | POA: Diagnosis not present

## 2015-11-23 DIAGNOSIS — R0789 Other chest pain: Secondary | ICD-10-CM | POA: Diagnosis not present

## 2015-11-25 DIAGNOSIS — F489 Nonpsychotic mental disorder, unspecified: Secondary | ICD-10-CM | POA: Diagnosis not present

## 2015-11-26 DIAGNOSIS — S3991XA Unspecified injury of abdomen, initial encounter: Secondary | ICD-10-CM | POA: Diagnosis not present

## 2015-11-26 DIAGNOSIS — S0990XA Unspecified injury of head, initial encounter: Secondary | ICD-10-CM | POA: Diagnosis not present

## 2015-11-26 DIAGNOSIS — J45909 Unspecified asthma, uncomplicated: Secondary | ICD-10-CM | POA: Diagnosis not present

## 2015-11-26 DIAGNOSIS — R569 Unspecified convulsions: Secondary | ICD-10-CM | POA: Diagnosis not present

## 2015-11-26 DIAGNOSIS — R109 Unspecified abdominal pain: Secondary | ICD-10-CM | POA: Diagnosis not present

## 2015-11-26 DIAGNOSIS — S199XXA Unspecified injury of neck, initial encounter: Secondary | ICD-10-CM | POA: Diagnosis not present

## 2015-11-26 DIAGNOSIS — R079 Chest pain, unspecified: Secondary | ICD-10-CM | POA: Diagnosis not present

## 2015-11-26 DIAGNOSIS — D649 Anemia, unspecified: Secondary | ICD-10-CM | POA: Diagnosis not present

## 2015-11-26 DIAGNOSIS — T22212A Burn of second degree of left forearm, initial encounter: Secondary | ICD-10-CM | POA: Diagnosis not present

## 2015-11-26 DIAGNOSIS — F39 Unspecified mood [affective] disorder: Secondary | ICD-10-CM | POA: Diagnosis not present

## 2015-11-26 DIAGNOSIS — G43909 Migraine, unspecified, not intractable, without status migrainosus: Secondary | ICD-10-CM | POA: Diagnosis not present

## 2015-11-26 DIAGNOSIS — I10 Essential (primary) hypertension: Secondary | ICD-10-CM | POA: Diagnosis not present

## 2015-11-26 DIAGNOSIS — Z765 Malingerer [conscious simulation]: Secondary | ICD-10-CM | POA: Diagnosis not present

## 2015-11-26 DIAGNOSIS — Z9889 Other specified postprocedural states: Secondary | ICD-10-CM | POA: Diagnosis not present

## 2015-11-26 DIAGNOSIS — R0602 Shortness of breath: Secondary | ICD-10-CM | POA: Diagnosis not present

## 2015-11-26 DIAGNOSIS — R0789 Other chest pain: Secondary | ICD-10-CM | POA: Diagnosis not present

## 2015-11-26 DIAGNOSIS — R1013 Epigastric pain: Secondary | ICD-10-CM | POA: Diagnosis not present

## 2015-11-26 DIAGNOSIS — E119 Type 2 diabetes mellitus without complications: Secondary | ICD-10-CM | POA: Diagnosis not present

## 2015-11-26 DIAGNOSIS — G8929 Other chronic pain: Secondary | ICD-10-CM | POA: Diagnosis not present

## 2015-11-26 DIAGNOSIS — S161XXA Strain of muscle, fascia and tendon at neck level, initial encounter: Secondary | ICD-10-CM | POA: Diagnosis not present

## 2015-11-26 DIAGNOSIS — R1033 Periumbilical pain: Secondary | ICD-10-CM | POA: Diagnosis not present

## 2015-11-26 DIAGNOSIS — T22112A Burn of first degree of left forearm, initial encounter: Secondary | ICD-10-CM | POA: Diagnosis not present

## 2015-11-26 DIAGNOSIS — G4089 Other seizures: Secondary | ICD-10-CM | POA: Diagnosis not present

## 2015-11-26 DIAGNOSIS — Z9049 Acquired absence of other specified parts of digestive tract: Secondary | ICD-10-CM | POA: Diagnosis not present

## 2015-11-27 DIAGNOSIS — R109 Unspecified abdominal pain: Secondary | ICD-10-CM | POA: Diagnosis not present

## 2015-11-27 DIAGNOSIS — S199XXA Unspecified injury of neck, initial encounter: Secondary | ICD-10-CM | POA: Diagnosis not present

## 2015-11-27 DIAGNOSIS — R569 Unspecified convulsions: Secondary | ICD-10-CM | POA: Diagnosis not present

## 2015-11-27 DIAGNOSIS — S3991XA Unspecified injury of abdomen, initial encounter: Secondary | ICD-10-CM | POA: Diagnosis not present

## 2015-11-27 DIAGNOSIS — F29 Unspecified psychosis not due to a substance or known physiological condition: Secondary | ICD-10-CM | POA: Diagnosis not present

## 2015-11-27 DIAGNOSIS — R45851 Suicidal ideations: Secondary | ICD-10-CM | POA: Diagnosis not present

## 2015-11-27 DIAGNOSIS — E119 Type 2 diabetes mellitus without complications: Secondary | ICD-10-CM | POA: Diagnosis not present

## 2015-11-27 DIAGNOSIS — R1084 Generalized abdominal pain: Secondary | ICD-10-CM | POA: Diagnosis not present

## 2015-11-27 DIAGNOSIS — I10 Essential (primary) hypertension: Secondary | ICD-10-CM | POA: Diagnosis not present

## 2015-11-27 DIAGNOSIS — F39 Unspecified mood [affective] disorder: Secondary | ICD-10-CM | POA: Diagnosis not present

## 2015-11-27 DIAGNOSIS — Z9889 Other specified postprocedural states: Secondary | ICD-10-CM | POA: Diagnosis not present

## 2015-11-27 DIAGNOSIS — G4089 Other seizures: Secondary | ICD-10-CM | POA: Diagnosis not present

## 2015-11-27 DIAGNOSIS — J45909 Unspecified asthma, uncomplicated: Secondary | ICD-10-CM | POA: Diagnosis not present

## 2015-11-27 DIAGNOSIS — S0990XA Unspecified injury of head, initial encounter: Secondary | ICD-10-CM | POA: Diagnosis not present

## 2015-11-27 DIAGNOSIS — R1033 Periumbilical pain: Secondary | ICD-10-CM | POA: Diagnosis not present

## 2015-11-27 DIAGNOSIS — S161XXA Strain of muscle, fascia and tendon at neck level, initial encounter: Secondary | ICD-10-CM | POA: Diagnosis not present

## 2015-11-27 DIAGNOSIS — Z765 Malingerer [conscious simulation]: Secondary | ICD-10-CM | POA: Diagnosis not present

## 2015-11-27 DIAGNOSIS — D649 Anemia, unspecified: Secondary | ICD-10-CM | POA: Diagnosis not present

## 2015-11-27 DIAGNOSIS — G4489 Other headache syndrome: Secondary | ICD-10-CM | POA: Diagnosis not present

## 2015-11-27 DIAGNOSIS — Z9049 Acquired absence of other specified parts of digestive tract: Secondary | ICD-10-CM | POA: Diagnosis not present

## 2015-11-27 DIAGNOSIS — G43909 Migraine, unspecified, not intractable, without status migrainosus: Secondary | ICD-10-CM | POA: Diagnosis not present

## 2015-11-27 DIAGNOSIS — R1013 Epigastric pain: Secondary | ICD-10-CM | POA: Diagnosis not present

## 2015-12-02 DIAGNOSIS — F320 Major depressive disorder, single episode, mild: Secondary | ICD-10-CM | POA: Diagnosis not present

## 2015-12-02 DIAGNOSIS — F450 Somatization disorder: Secondary | ICD-10-CM | POA: Diagnosis not present

## 2015-12-04 DIAGNOSIS — R112 Nausea with vomiting, unspecified: Secondary | ICD-10-CM | POA: Diagnosis not present

## 2015-12-04 DIAGNOSIS — R05 Cough: Secondary | ICD-10-CM | POA: Diagnosis not present

## 2015-12-04 DIAGNOSIS — F39 Unspecified mood [affective] disorder: Secondary | ICD-10-CM | POA: Diagnosis not present

## 2015-12-04 DIAGNOSIS — R079 Chest pain, unspecified: Secondary | ICD-10-CM | POA: Diagnosis not present

## 2015-12-04 DIAGNOSIS — E119 Type 2 diabetes mellitus without complications: Secondary | ICD-10-CM | POA: Diagnosis not present

## 2015-12-04 DIAGNOSIS — R509 Fever, unspecified: Secondary | ICD-10-CM | POA: Diagnosis not present

## 2015-12-04 DIAGNOSIS — R0602 Shortness of breath: Secondary | ICD-10-CM | POA: Diagnosis not present

## 2015-12-04 DIAGNOSIS — R0789 Other chest pain: Secondary | ICD-10-CM | POA: Diagnosis not present

## 2015-12-04 DIAGNOSIS — J45909 Unspecified asthma, uncomplicated: Secondary | ICD-10-CM | POA: Diagnosis not present

## 2015-12-04 DIAGNOSIS — I10 Essential (primary) hypertension: Secondary | ICD-10-CM | POA: Diagnosis not present

## 2015-12-06 DIAGNOSIS — R079 Chest pain, unspecified: Secondary | ICD-10-CM | POA: Diagnosis not present

## 2015-12-06 DIAGNOSIS — R0789 Other chest pain: Secondary | ICD-10-CM | POA: Diagnosis not present

## 2015-12-06 DIAGNOSIS — R072 Precordial pain: Secondary | ICD-10-CM | POA: Diagnosis not present

## 2015-12-10 DIAGNOSIS — E119 Type 2 diabetes mellitus without complications: Secondary | ICD-10-CM | POA: Diagnosis not present

## 2015-12-10 DIAGNOSIS — E860 Dehydration: Secondary | ICD-10-CM | POA: Diagnosis not present

## 2015-12-10 DIAGNOSIS — I10 Essential (primary) hypertension: Secondary | ICD-10-CM | POA: Diagnosis not present

## 2015-12-10 DIAGNOSIS — K2970 Gastritis, unspecified, without bleeding: Secondary | ICD-10-CM | POA: Diagnosis not present

## 2015-12-10 DIAGNOSIS — J449 Chronic obstructive pulmonary disease, unspecified: Secondary | ICD-10-CM | POA: Diagnosis not present

## 2015-12-10 DIAGNOSIS — R002 Palpitations: Secondary | ICD-10-CM | POA: Diagnosis not present

## 2015-12-10 DIAGNOSIS — R0602 Shortness of breath: Secondary | ICD-10-CM | POA: Diagnosis not present

## 2015-12-10 DIAGNOSIS — R1013 Epigastric pain: Secondary | ICD-10-CM | POA: Diagnosis not present

## 2015-12-10 DIAGNOSIS — R112 Nausea with vomiting, unspecified: Secondary | ICD-10-CM | POA: Diagnosis not present

## 2015-12-11 DIAGNOSIS — R1013 Epigastric pain: Secondary | ICD-10-CM | POA: Diagnosis not present

## 2015-12-13 DIAGNOSIS — R112 Nausea with vomiting, unspecified: Secondary | ICD-10-CM | POA: Diagnosis not present

## 2015-12-13 DIAGNOSIS — E1165 Type 2 diabetes mellitus with hyperglycemia: Secondary | ICD-10-CM | POA: Diagnosis not present

## 2015-12-13 DIAGNOSIS — R0602 Shortness of breath: Secondary | ICD-10-CM | POA: Diagnosis not present

## 2015-12-13 DIAGNOSIS — R079 Chest pain, unspecified: Secondary | ICD-10-CM | POA: Diagnosis not present

## 2015-12-13 DIAGNOSIS — Z024 Encounter for examination for driving license: Secondary | ICD-10-CM | POA: Diagnosis not present

## 2015-12-13 DIAGNOSIS — I10 Essential (primary) hypertension: Secondary | ICD-10-CM | POA: Diagnosis not present

## 2015-12-21 DIAGNOSIS — R072 Precordial pain: Secondary | ICD-10-CM | POA: Diagnosis not present

## 2015-12-21 DIAGNOSIS — R079 Chest pain, unspecified: Secondary | ICD-10-CM | POA: Diagnosis not present

## 2015-12-21 DIAGNOSIS — F4521 Hypochondriasis: Secondary | ICD-10-CM | POA: Diagnosis not present

## 2015-12-23 DIAGNOSIS — G894 Chronic pain syndrome: Secondary | ICD-10-CM | POA: Diagnosis not present

## 2015-12-23 DIAGNOSIS — I10 Essential (primary) hypertension: Secondary | ICD-10-CM | POA: Diagnosis not present

## 2015-12-23 DIAGNOSIS — J45998 Other asthma: Secondary | ICD-10-CM | POA: Diagnosis not present

## 2015-12-23 DIAGNOSIS — J4540 Moderate persistent asthma, uncomplicated: Secondary | ICD-10-CM | POA: Diagnosis not present

## 2015-12-23 DIAGNOSIS — E119 Type 2 diabetes mellitus without complications: Secondary | ICD-10-CM | POA: Diagnosis not present

## 2015-12-24 DIAGNOSIS — R404 Transient alteration of awareness: Secondary | ICD-10-CM | POA: Diagnosis not present

## 2015-12-24 DIAGNOSIS — I959 Hypotension, unspecified: Secondary | ICD-10-CM | POA: Diagnosis not present

## 2015-12-24 DIAGNOSIS — R51 Headache: Secondary | ICD-10-CM | POA: Diagnosis not present

## 2015-12-24 DIAGNOSIS — N289 Disorder of kidney and ureter, unspecified: Secondary | ICD-10-CM | POA: Diagnosis not present

## 2015-12-24 DIAGNOSIS — E860 Dehydration: Secondary | ICD-10-CM | POA: Diagnosis not present

## 2015-12-24 DIAGNOSIS — R55 Syncope and collapse: Secondary | ICD-10-CM | POA: Diagnosis not present

## 2015-12-31 DIAGNOSIS — N179 Acute kidney failure, unspecified: Secondary | ICD-10-CM | POA: Diagnosis not present

## 2016-01-11 DIAGNOSIS — H53453 Other localized visual field defect, bilateral: Secondary | ICD-10-CM | POA: Diagnosis not present

## 2016-01-11 DIAGNOSIS — H53483 Generalized contraction of visual field, bilateral: Secondary | ICD-10-CM | POA: Diagnosis not present

## 2016-01-11 DIAGNOSIS — H5362 Acquired night blindness: Secondary | ICD-10-CM | POA: Diagnosis not present

## 2016-01-11 DIAGNOSIS — H53133 Sudden visual loss, bilateral: Secondary | ICD-10-CM | POA: Diagnosis not present

## 2016-01-11 DIAGNOSIS — H538 Other visual disturbances: Secondary | ICD-10-CM | POA: Diagnosis not present

## 2016-01-20 DIAGNOSIS — J40 Bronchitis, not specified as acute or chronic: Secondary | ICD-10-CM | POA: Diagnosis not present

## 2016-01-20 DIAGNOSIS — J329 Chronic sinusitis, unspecified: Secondary | ICD-10-CM | POA: Diagnosis not present

## 2016-01-21 DIAGNOSIS — R404 Transient alteration of awareness: Secondary | ICD-10-CM | POA: Diagnosis not present

## 2016-01-21 DIAGNOSIS — R55 Syncope and collapse: Secondary | ICD-10-CM | POA: Diagnosis not present

## 2016-01-21 DIAGNOSIS — R079 Chest pain, unspecified: Secondary | ICD-10-CM | POA: Diagnosis not present

## 2016-01-21 DIAGNOSIS — J181 Lobar pneumonia, unspecified organism: Secondary | ICD-10-CM | POA: Diagnosis not present

## 2016-01-21 DIAGNOSIS — R1111 Vomiting without nausea: Secondary | ICD-10-CM | POA: Diagnosis not present

## 2016-01-21 DIAGNOSIS — R531 Weakness: Secondary | ICD-10-CM | POA: Diagnosis not present

## 2016-01-22 DIAGNOSIS — J4540 Moderate persistent asthma, uncomplicated: Secondary | ICD-10-CM | POA: Diagnosis not present

## 2016-01-23 DIAGNOSIS — I10 Essential (primary) hypertension: Secondary | ICD-10-CM | POA: Diagnosis not present

## 2016-01-23 DIAGNOSIS — G894 Chronic pain syndrome: Secondary | ICD-10-CM | POA: Diagnosis not present

## 2016-01-23 DIAGNOSIS — E119 Type 2 diabetes mellitus without complications: Secondary | ICD-10-CM | POA: Diagnosis not present

## 2016-01-23 DIAGNOSIS — J45998 Other asthma: Secondary | ICD-10-CM | POA: Diagnosis not present

## 2016-02-01 DIAGNOSIS — J159 Unspecified bacterial pneumonia: Secondary | ICD-10-CM | POA: Diagnosis not present

## 2016-02-08 DIAGNOSIS — J329 Chronic sinusitis, unspecified: Secondary | ICD-10-CM | POA: Diagnosis not present

## 2016-02-22 DIAGNOSIS — J4540 Moderate persistent asthma, uncomplicated: Secondary | ICD-10-CM | POA: Diagnosis not present

## 2016-02-23 DIAGNOSIS — I10 Essential (primary) hypertension: Secondary | ICD-10-CM | POA: Diagnosis not present

## 2016-02-23 DIAGNOSIS — E119 Type 2 diabetes mellitus without complications: Secondary | ICD-10-CM | POA: Diagnosis not present

## 2016-02-23 DIAGNOSIS — J45998 Other asthma: Secondary | ICD-10-CM | POA: Diagnosis not present

## 2016-02-23 DIAGNOSIS — G894 Chronic pain syndrome: Secondary | ICD-10-CM | POA: Diagnosis not present

## 2016-03-09 DIAGNOSIS — F320 Major depressive disorder, single episode, mild: Secondary | ICD-10-CM | POA: Diagnosis not present

## 2016-03-09 DIAGNOSIS — F4310 Post-traumatic stress disorder, unspecified: Secondary | ICD-10-CM | POA: Diagnosis not present

## 2016-03-09 DIAGNOSIS — F450 Somatization disorder: Secondary | ICD-10-CM | POA: Diagnosis not present

## 2016-03-22 DIAGNOSIS — I10 Essential (primary) hypertension: Secondary | ICD-10-CM | POA: Diagnosis not present

## 2016-03-22 DIAGNOSIS — J45998 Other asthma: Secondary | ICD-10-CM | POA: Diagnosis not present

## 2016-03-22 DIAGNOSIS — E119 Type 2 diabetes mellitus without complications: Secondary | ICD-10-CM | POA: Diagnosis not present

## 2016-03-22 DIAGNOSIS — G894 Chronic pain syndrome: Secondary | ICD-10-CM | POA: Diagnosis not present

## 2016-03-22 DIAGNOSIS — J4540 Moderate persistent asthma, uncomplicated: Secondary | ICD-10-CM | POA: Diagnosis not present

## 2016-04-19 DIAGNOSIS — F459 Somatoform disorder, unspecified: Secondary | ICD-10-CM | POA: Diagnosis not present

## 2016-04-19 DIAGNOSIS — F329 Major depressive disorder, single episode, unspecified: Secondary | ICD-10-CM | POA: Diagnosis not present

## 2016-04-19 DIAGNOSIS — R079 Chest pain, unspecified: Secondary | ICD-10-CM | POA: Diagnosis not present

## 2016-04-19 DIAGNOSIS — R0789 Other chest pain: Secondary | ICD-10-CM | POA: Diagnosis not present

## 2016-04-20 DIAGNOSIS — R079 Chest pain, unspecified: Secondary | ICD-10-CM | POA: Diagnosis not present

## 2016-04-21 DIAGNOSIS — J4540 Moderate persistent asthma, uncomplicated: Secondary | ICD-10-CM | POA: Diagnosis not present

## 2016-04-22 DIAGNOSIS — G894 Chronic pain syndrome: Secondary | ICD-10-CM | POA: Diagnosis not present

## 2016-04-22 DIAGNOSIS — I10 Essential (primary) hypertension: Secondary | ICD-10-CM | POA: Diagnosis not present

## 2016-04-22 DIAGNOSIS — E119 Type 2 diabetes mellitus without complications: Secondary | ICD-10-CM | POA: Diagnosis not present

## 2016-04-22 DIAGNOSIS — J45998 Other asthma: Secondary | ICD-10-CM | POA: Diagnosis not present

## 2016-05-05 DIAGNOSIS — R072 Precordial pain: Secondary | ICD-10-CM | POA: Diagnosis not present

## 2016-05-05 DIAGNOSIS — R0789 Other chest pain: Secondary | ICD-10-CM | POA: Diagnosis not present

## 2016-05-06 DIAGNOSIS — J45909 Unspecified asthma, uncomplicated: Secondary | ICD-10-CM | POA: Diagnosis not present

## 2016-05-06 DIAGNOSIS — Z882 Allergy status to sulfonamides status: Secondary | ICD-10-CM | POA: Diagnosis not present

## 2016-05-06 DIAGNOSIS — Z7982 Long term (current) use of aspirin: Secondary | ICD-10-CM | POA: Diagnosis not present

## 2016-05-06 DIAGNOSIS — F419 Anxiety disorder, unspecified: Secondary | ICD-10-CM | POA: Diagnosis not present

## 2016-05-06 DIAGNOSIS — Z880 Allergy status to penicillin: Secondary | ICD-10-CM | POA: Diagnosis not present

## 2016-05-06 DIAGNOSIS — G4489 Other headache syndrome: Secondary | ICD-10-CM | POA: Diagnosis not present

## 2016-05-06 DIAGNOSIS — F319 Bipolar disorder, unspecified: Secondary | ICD-10-CM | POA: Diagnosis not present

## 2016-05-06 DIAGNOSIS — F459 Somatoform disorder, unspecified: Secondary | ICD-10-CM | POA: Diagnosis not present

## 2016-05-06 DIAGNOSIS — R51 Headache: Secondary | ICD-10-CM | POA: Diagnosis not present

## 2016-05-06 DIAGNOSIS — R0682 Tachypnea, not elsewhere classified: Secondary | ICD-10-CM | POA: Diagnosis not present

## 2016-05-06 DIAGNOSIS — F449 Dissociative and conversion disorder, unspecified: Secondary | ICD-10-CM | POA: Diagnosis not present

## 2016-05-06 DIAGNOSIS — I10 Essential (primary) hypertension: Secondary | ICD-10-CM | POA: Diagnosis not present

## 2016-05-06 DIAGNOSIS — E119 Type 2 diabetes mellitus without complications: Secondary | ICD-10-CM | POA: Diagnosis not present

## 2016-05-16 DIAGNOSIS — E1165 Type 2 diabetes mellitus with hyperglycemia: Secondary | ICD-10-CM | POA: Diagnosis not present

## 2016-05-16 DIAGNOSIS — Z6832 Body mass index (BMI) 32.0-32.9, adult: Secondary | ICD-10-CM | POA: Diagnosis not present

## 2016-05-16 DIAGNOSIS — G43719 Chronic migraine without aura, intractable, without status migrainosus: Secondary | ICD-10-CM | POA: Diagnosis not present

## 2016-05-16 DIAGNOSIS — E782 Mixed hyperlipidemia: Secondary | ICD-10-CM | POA: Diagnosis not present

## 2016-05-16 DIAGNOSIS — R197 Diarrhea, unspecified: Secondary | ICD-10-CM | POA: Diagnosis not present

## 2016-05-16 DIAGNOSIS — R0789 Other chest pain: Secondary | ICD-10-CM | POA: Diagnosis not present

## 2016-05-18 DIAGNOSIS — Z6832 Body mass index (BMI) 32.0-32.9, adult: Secondary | ICD-10-CM | POA: Diagnosis not present

## 2016-05-18 DIAGNOSIS — R197 Diarrhea, unspecified: Secondary | ICD-10-CM | POA: Diagnosis not present

## 2016-05-19 DIAGNOSIS — R197 Diarrhea, unspecified: Secondary | ICD-10-CM | POA: Diagnosis not present

## 2016-05-22 DIAGNOSIS — J45998 Other asthma: Secondary | ICD-10-CM | POA: Diagnosis not present

## 2016-05-22 DIAGNOSIS — G894 Chronic pain syndrome: Secondary | ICD-10-CM | POA: Diagnosis not present

## 2016-05-22 DIAGNOSIS — E119 Type 2 diabetes mellitus without complications: Secondary | ICD-10-CM | POA: Diagnosis not present

## 2016-05-22 DIAGNOSIS — J0100 Acute maxillary sinusitis, unspecified: Secondary | ICD-10-CM | POA: Diagnosis not present

## 2016-05-22 DIAGNOSIS — J4540 Moderate persistent asthma, uncomplicated: Secondary | ICD-10-CM | POA: Diagnosis not present

## 2016-05-22 DIAGNOSIS — I10 Essential (primary) hypertension: Secondary | ICD-10-CM | POA: Diagnosis not present

## 2016-06-01 DIAGNOSIS — F450 Somatization disorder: Secondary | ICD-10-CM | POA: Diagnosis not present

## 2016-06-01 DIAGNOSIS — F4310 Post-traumatic stress disorder, unspecified: Secondary | ICD-10-CM | POA: Diagnosis not present

## 2016-06-01 DIAGNOSIS — F4323 Adjustment disorder with mixed anxiety and depressed mood: Secondary | ICD-10-CM

## 2016-06-01 DIAGNOSIS — F330 Major depressive disorder, recurrent, mild: Secondary | ICD-10-CM | POA: Diagnosis not present

## 2016-06-01 HISTORY — DX: Adjustment disorder with mixed anxiety and depressed mood: F43.23

## 2016-06-21 DIAGNOSIS — J4540 Moderate persistent asthma, uncomplicated: Secondary | ICD-10-CM | POA: Diagnosis not present

## 2016-06-22 DIAGNOSIS — G894 Chronic pain syndrome: Secondary | ICD-10-CM | POA: Diagnosis not present

## 2016-06-22 DIAGNOSIS — J45998 Other asthma: Secondary | ICD-10-CM | POA: Diagnosis not present

## 2016-06-22 DIAGNOSIS — I10 Essential (primary) hypertension: Secondary | ICD-10-CM | POA: Diagnosis not present

## 2016-06-22 DIAGNOSIS — E119 Type 2 diabetes mellitus without complications: Secondary | ICD-10-CM | POA: Diagnosis not present

## 2016-07-02 DIAGNOSIS — R079 Chest pain, unspecified: Secondary | ICD-10-CM | POA: Diagnosis not present

## 2016-07-02 DIAGNOSIS — R112 Nausea with vomiting, unspecified: Secondary | ICD-10-CM | POA: Diagnosis not present

## 2016-07-02 DIAGNOSIS — G8929 Other chronic pain: Secondary | ICD-10-CM | POA: Diagnosis not present

## 2016-07-02 DIAGNOSIS — R0602 Shortness of breath: Secondary | ICD-10-CM | POA: Diagnosis not present

## 2016-07-07 DIAGNOSIS — R000 Tachycardia, unspecified: Secondary | ICD-10-CM | POA: Diagnosis not present

## 2016-07-07 DIAGNOSIS — R0602 Shortness of breath: Secondary | ICD-10-CM | POA: Diagnosis not present

## 2016-07-07 DIAGNOSIS — I160 Hypertensive urgency: Secondary | ICD-10-CM | POA: Diagnosis not present

## 2016-07-07 DIAGNOSIS — R079 Chest pain, unspecified: Secondary | ICD-10-CM | POA: Diagnosis not present

## 2016-07-07 DIAGNOSIS — R0789 Other chest pain: Secondary | ICD-10-CM | POA: Diagnosis not present

## 2016-07-08 DIAGNOSIS — I10 Essential (primary) hypertension: Secondary | ICD-10-CM | POA: Diagnosis not present

## 2016-07-08 DIAGNOSIS — G40909 Epilepsy, unspecified, not intractable, without status epilepticus: Secondary | ICD-10-CM | POA: Diagnosis not present

## 2016-07-08 DIAGNOSIS — Z9119 Patient's noncompliance with other medical treatment and regimen: Secondary | ICD-10-CM | POA: Diagnosis not present

## 2016-07-08 DIAGNOSIS — I119 Hypertensive heart disease without heart failure: Secondary | ICD-10-CM | POA: Diagnosis not present

## 2016-07-08 DIAGNOSIS — R51 Headache: Secondary | ICD-10-CM | POA: Diagnosis not present

## 2016-07-08 DIAGNOSIS — R0789 Other chest pain: Secondary | ICD-10-CM | POA: Diagnosis not present

## 2016-07-08 DIAGNOSIS — F419 Anxiety disorder, unspecified: Secondary | ICD-10-CM | POA: Diagnosis not present

## 2016-07-08 DIAGNOSIS — R0602 Shortness of breath: Secondary | ICD-10-CM | POA: Diagnosis not present

## 2016-07-08 DIAGNOSIS — E669 Obesity, unspecified: Secondary | ICD-10-CM | POA: Diagnosis not present

## 2016-07-08 DIAGNOSIS — I160 Hypertensive urgency: Secondary | ICD-10-CM | POA: Diagnosis not present

## 2016-07-08 DIAGNOSIS — R000 Tachycardia, unspecified: Secondary | ICD-10-CM | POA: Diagnosis not present

## 2016-07-08 DIAGNOSIS — F319 Bipolar disorder, unspecified: Secondary | ICD-10-CM | POA: Diagnosis not present

## 2016-07-08 DIAGNOSIS — G43909 Migraine, unspecified, not intractable, without status migrainosus: Secondary | ICD-10-CM | POA: Diagnosis not present

## 2016-07-08 DIAGNOSIS — I209 Angina pectoris, unspecified: Secondary | ICD-10-CM | POA: Diagnosis not present

## 2016-07-08 DIAGNOSIS — R112 Nausea with vomiting, unspecified: Secondary | ICD-10-CM | POA: Diagnosis not present

## 2016-07-08 DIAGNOSIS — F418 Other specified anxiety disorders: Secondary | ICD-10-CM | POA: Diagnosis not present

## 2016-07-08 DIAGNOSIS — E119 Type 2 diabetes mellitus without complications: Secondary | ICD-10-CM | POA: Diagnosis not present

## 2016-07-09 DIAGNOSIS — I10 Essential (primary) hypertension: Secondary | ICD-10-CM

## 2016-07-10 DIAGNOSIS — R071 Chest pain on breathing: Secondary | ICD-10-CM | POA: Diagnosis not present

## 2016-07-10 DIAGNOSIS — G8929 Other chronic pain: Secondary | ICD-10-CM | POA: Diagnosis not present

## 2016-07-10 DIAGNOSIS — I10 Essential (primary) hypertension: Secondary | ICD-10-CM | POA: Diagnosis not present

## 2016-07-10 DIAGNOSIS — R079 Chest pain, unspecified: Secondary | ICD-10-CM | POA: Diagnosis not present

## 2016-07-10 DIAGNOSIS — F321 Major depressive disorder, single episode, moderate: Secondary | ICD-10-CM | POA: Diagnosis not present

## 2016-07-10 DIAGNOSIS — R072 Precordial pain: Secondary | ICD-10-CM | POA: Diagnosis not present

## 2016-07-12 DIAGNOSIS — Z79899 Other long term (current) drug therapy: Secondary | ICD-10-CM | POA: Diagnosis not present

## 2016-07-12 DIAGNOSIS — F339 Major depressive disorder, recurrent, unspecified: Secondary | ICD-10-CM | POA: Diagnosis not present

## 2016-07-12 DIAGNOSIS — F449 Dissociative and conversion disorder, unspecified: Secondary | ICD-10-CM | POA: Diagnosis not present

## 2016-07-12 DIAGNOSIS — I10 Essential (primary) hypertension: Secondary | ICD-10-CM | POA: Diagnosis not present

## 2016-07-12 DIAGNOSIS — J45909 Unspecified asthma, uncomplicated: Secondary | ICD-10-CM | POA: Diagnosis not present

## 2016-07-12 DIAGNOSIS — R569 Unspecified convulsions: Secondary | ICD-10-CM | POA: Diagnosis not present

## 2016-07-12 DIAGNOSIS — F4310 Post-traumatic stress disorder, unspecified: Secondary | ICD-10-CM | POA: Diagnosis not present

## 2016-07-12 DIAGNOSIS — Z8669 Personal history of other diseases of the nervous system and sense organs: Secondary | ICD-10-CM | POA: Diagnosis not present

## 2016-07-12 DIAGNOSIS — F332 Major depressive disorder, recurrent severe without psychotic features: Secondary | ICD-10-CM | POA: Diagnosis not present

## 2016-07-12 DIAGNOSIS — R45851 Suicidal ideations: Secondary | ICD-10-CM | POA: Diagnosis not present

## 2016-07-12 DIAGNOSIS — F39 Unspecified mood [affective] disorder: Secondary | ICD-10-CM | POA: Diagnosis not present

## 2016-07-12 DIAGNOSIS — T7491XA Unspecified adult maltreatment, confirmed, initial encounter: Secondary | ICD-10-CM | POA: Diagnosis not present

## 2016-07-12 DIAGNOSIS — Z7982 Long term (current) use of aspirin: Secondary | ICD-10-CM | POA: Diagnosis not present

## 2016-07-12 DIAGNOSIS — R079 Chest pain, unspecified: Secondary | ICD-10-CM | POA: Diagnosis not present

## 2016-07-12 DIAGNOSIS — E119 Type 2 diabetes mellitus without complications: Secondary | ICD-10-CM | POA: Diagnosis not present

## 2016-07-12 DIAGNOSIS — F1020 Alcohol dependence, uncomplicated: Secondary | ICD-10-CM | POA: Diagnosis not present

## 2016-07-13 DIAGNOSIS — R45851 Suicidal ideations: Secondary | ICD-10-CM | POA: Insufficient documentation

## 2016-07-22 DIAGNOSIS — J45998 Other asthma: Secondary | ICD-10-CM | POA: Diagnosis not present

## 2016-07-22 DIAGNOSIS — E119 Type 2 diabetes mellitus without complications: Secondary | ICD-10-CM | POA: Diagnosis not present

## 2016-07-22 DIAGNOSIS — G894 Chronic pain syndrome: Secondary | ICD-10-CM | POA: Diagnosis not present

## 2016-07-22 DIAGNOSIS — J4540 Moderate persistent asthma, uncomplicated: Secondary | ICD-10-CM | POA: Diagnosis not present

## 2016-07-22 DIAGNOSIS — I10 Essential (primary) hypertension: Secondary | ICD-10-CM | POA: Diagnosis not present

## 2016-07-24 DIAGNOSIS — F39 Unspecified mood [affective] disorder: Secondary | ICD-10-CM | POA: Diagnosis not present

## 2016-07-24 DIAGNOSIS — F329 Major depressive disorder, single episode, unspecified: Secondary | ICD-10-CM | POA: Diagnosis not present

## 2016-07-24 DIAGNOSIS — E1165 Type 2 diabetes mellitus with hyperglycemia: Secondary | ICD-10-CM | POA: Diagnosis not present

## 2016-07-24 DIAGNOSIS — F333 Major depressive disorder, recurrent, severe with psychotic symptoms: Secondary | ICD-10-CM | POA: Diagnosis not present

## 2016-07-24 DIAGNOSIS — J45909 Unspecified asthma, uncomplicated: Secondary | ICD-10-CM | POA: Diagnosis not present

## 2016-07-24 DIAGNOSIS — F609 Personality disorder, unspecified: Secondary | ICD-10-CM | POA: Diagnosis not present

## 2016-07-24 DIAGNOSIS — T391X2A Poisoning by 4-Aminophenol derivatives, intentional self-harm, initial encounter: Secondary | ICD-10-CM | POA: Diagnosis not present

## 2016-07-24 DIAGNOSIS — F29 Unspecified psychosis not due to a substance or known physiological condition: Secondary | ICD-10-CM | POA: Diagnosis not present

## 2016-07-24 DIAGNOSIS — F419 Anxiety disorder, unspecified: Secondary | ICD-10-CM | POA: Diagnosis not present

## 2016-07-24 DIAGNOSIS — F603 Borderline personality disorder: Secondary | ICD-10-CM | POA: Diagnosis not present

## 2016-07-24 DIAGNOSIS — R45851 Suicidal ideations: Secondary | ICD-10-CM | POA: Diagnosis not present

## 2016-07-24 DIAGNOSIS — I209 Angina pectoris, unspecified: Secondary | ICD-10-CM | POA: Diagnosis not present

## 2016-07-24 DIAGNOSIS — E876 Hypokalemia: Secondary | ICD-10-CM | POA: Diagnosis not present

## 2016-07-24 DIAGNOSIS — F332 Major depressive disorder, recurrent severe without psychotic features: Secondary | ICD-10-CM | POA: Diagnosis not present

## 2016-07-24 DIAGNOSIS — T1491XA Suicide attempt, initial encounter: Secondary | ICD-10-CM | POA: Diagnosis not present

## 2016-07-24 DIAGNOSIS — F4489 Other dissociative and conversion disorders: Secondary | ICD-10-CM | POA: Diagnosis not present

## 2016-07-24 DIAGNOSIS — I10 Essential (primary) hypertension: Secondary | ICD-10-CM | POA: Diagnosis not present

## 2016-07-31 DIAGNOSIS — F330 Major depressive disorder, recurrent, mild: Secondary | ICD-10-CM | POA: Diagnosis not present

## 2016-07-31 DIAGNOSIS — F4310 Post-traumatic stress disorder, unspecified: Secondary | ICD-10-CM | POA: Diagnosis not present

## 2016-07-31 DIAGNOSIS — F4323 Adjustment disorder with mixed anxiety and depressed mood: Secondary | ICD-10-CM | POA: Diagnosis not present

## 2016-08-04 DIAGNOSIS — I10 Essential (primary) hypertension: Secondary | ICD-10-CM | POA: Diagnosis not present

## 2016-08-04 DIAGNOSIS — F333 Major depressive disorder, recurrent, severe with psychotic symptoms: Secondary | ICD-10-CM | POA: Diagnosis not present

## 2016-08-06 DIAGNOSIS — R1013 Epigastric pain: Secondary | ICD-10-CM | POA: Diagnosis not present

## 2016-08-06 DIAGNOSIS — I160 Hypertensive urgency: Secondary | ICD-10-CM | POA: Diagnosis not present

## 2016-08-06 DIAGNOSIS — R0603 Acute respiratory distress: Secondary | ICD-10-CM | POA: Diagnosis not present

## 2016-08-21 DIAGNOSIS — J4540 Moderate persistent asthma, uncomplicated: Secondary | ICD-10-CM | POA: Diagnosis not present

## 2016-08-22 DIAGNOSIS — G894 Chronic pain syndrome: Secondary | ICD-10-CM | POA: Diagnosis not present

## 2016-08-22 DIAGNOSIS — I10 Essential (primary) hypertension: Secondary | ICD-10-CM | POA: Diagnosis not present

## 2016-08-22 DIAGNOSIS — E119 Type 2 diabetes mellitus without complications: Secondary | ICD-10-CM | POA: Diagnosis not present

## 2016-08-22 DIAGNOSIS — J45998 Other asthma: Secondary | ICD-10-CM | POA: Diagnosis not present

## 2016-08-23 DIAGNOSIS — T50902A Poisoning by unspecified drugs, medicaments and biological substances, intentional self-harm, initial encounter: Secondary | ICD-10-CM | POA: Diagnosis not present

## 2016-08-23 DIAGNOSIS — E872 Acidosis, unspecified: Secondary | ICD-10-CM | POA: Insufficient documentation

## 2016-08-23 DIAGNOSIS — F329 Major depressive disorder, single episode, unspecified: Secondary | ICD-10-CM | POA: Diagnosis not present

## 2016-08-23 DIAGNOSIS — E860 Dehydration: Secondary | ICD-10-CM | POA: Diagnosis not present

## 2016-08-23 DIAGNOSIS — M6282 Rhabdomyolysis: Secondary | ICD-10-CM | POA: Diagnosis not present

## 2016-08-23 DIAGNOSIS — R55 Syncope and collapse: Secondary | ICD-10-CM | POA: Diagnosis not present

## 2016-08-23 DIAGNOSIS — R45851 Suicidal ideations: Secondary | ICD-10-CM | POA: Diagnosis not present

## 2016-08-23 DIAGNOSIS — E119 Type 2 diabetes mellitus without complications: Secondary | ICD-10-CM | POA: Diagnosis not present

## 2016-08-23 DIAGNOSIS — T391X2A Poisoning by 4-Aminophenol derivatives, intentional self-harm, initial encounter: Secondary | ICD-10-CM | POA: Diagnosis not present

## 2016-08-23 DIAGNOSIS — F333 Major depressive disorder, recurrent, severe with psychotic symptoms: Secondary | ICD-10-CM | POA: Diagnosis not present

## 2016-08-23 DIAGNOSIS — G43709 Chronic migraine without aura, not intractable, without status migrainosus: Secondary | ICD-10-CM | POA: Diagnosis not present

## 2016-08-23 DIAGNOSIS — K219 Gastro-esophageal reflux disease without esophagitis: Secondary | ICD-10-CM | POA: Diagnosis not present

## 2016-08-23 DIAGNOSIS — R45850 Homicidal ideations: Secondary | ICD-10-CM

## 2016-08-23 DIAGNOSIS — F609 Personality disorder, unspecified: Secondary | ICD-10-CM | POA: Diagnosis not present

## 2016-08-23 DIAGNOSIS — K5790 Diverticulosis of intestine, part unspecified, without perforation or abscess without bleeding: Secondary | ICD-10-CM | POA: Diagnosis not present

## 2016-08-23 DIAGNOSIS — N179 Acute kidney failure, unspecified: Secondary | ICD-10-CM | POA: Diagnosis not present

## 2016-08-23 DIAGNOSIS — I10 Essential (primary) hypertension: Secondary | ICD-10-CM | POA: Diagnosis not present

## 2016-08-23 DIAGNOSIS — T50912A Poisoning by multiple unspecified drugs, medicaments and biological substances, intentional self-harm, initial encounter: Secondary | ICD-10-CM | POA: Insufficient documentation

## 2016-08-23 HISTORY — DX: Homicidal ideations: R45.850

## 2016-09-07 DIAGNOSIS — D225 Melanocytic nevi of trunk: Secondary | ICD-10-CM | POA: Diagnosis not present

## 2016-09-07 DIAGNOSIS — Z79899 Other long term (current) drug therapy: Secondary | ICD-10-CM

## 2016-09-07 DIAGNOSIS — J0110 Acute frontal sinusitis, unspecified: Secondary | ICD-10-CM

## 2016-09-07 HISTORY — DX: Acute frontal sinusitis, unspecified: J01.10

## 2016-09-07 HISTORY — DX: Other long term (current) drug therapy: Z79.899

## 2016-09-21 DIAGNOSIS — J4540 Moderate persistent asthma, uncomplicated: Secondary | ICD-10-CM | POA: Diagnosis not present

## 2016-09-22 DIAGNOSIS — G894 Chronic pain syndrome: Secondary | ICD-10-CM | POA: Diagnosis not present

## 2016-09-22 DIAGNOSIS — J45998 Other asthma: Secondary | ICD-10-CM | POA: Diagnosis not present

## 2016-09-22 DIAGNOSIS — I10 Essential (primary) hypertension: Secondary | ICD-10-CM | POA: Diagnosis not present

## 2016-09-22 DIAGNOSIS — E119 Type 2 diabetes mellitus without complications: Secondary | ICD-10-CM | POA: Diagnosis not present

## 2016-09-28 DIAGNOSIS — L728 Other follicular cysts of the skin and subcutaneous tissue: Secondary | ICD-10-CM | POA: Diagnosis not present

## 2016-09-28 DIAGNOSIS — D485 Neoplasm of uncertain behavior of skin: Secondary | ICD-10-CM | POA: Diagnosis not present

## 2016-10-03 DIAGNOSIS — R0981 Nasal congestion: Secondary | ICD-10-CM | POA: Insufficient documentation

## 2016-10-17 DIAGNOSIS — R000 Tachycardia, unspecified: Secondary | ICD-10-CM | POA: Diagnosis not present

## 2016-10-17 DIAGNOSIS — R1031 Right lower quadrant pain: Secondary | ICD-10-CM | POA: Diagnosis not present

## 2016-10-17 DIAGNOSIS — F6810 Factitious disorder, unspecified: Secondary | ICD-10-CM | POA: Diagnosis not present

## 2016-10-17 DIAGNOSIS — E860 Dehydration: Secondary | ICD-10-CM | POA: Diagnosis not present

## 2016-10-17 DIAGNOSIS — R079 Chest pain, unspecified: Secondary | ICD-10-CM | POA: Diagnosis not present

## 2016-10-17 DIAGNOSIS — F329 Major depressive disorder, single episode, unspecified: Secondary | ICD-10-CM | POA: Diagnosis not present

## 2016-10-17 DIAGNOSIS — J9690 Respiratory failure, unspecified, unspecified whether with hypoxia or hypercapnia: Secondary | ICD-10-CM | POA: Diagnosis not present

## 2016-10-17 DIAGNOSIS — R102 Pelvic and perineal pain: Secondary | ICD-10-CM | POA: Diagnosis not present

## 2016-10-17 DIAGNOSIS — R1033 Periumbilical pain: Secondary | ICD-10-CM | POA: Diagnosis not present

## 2016-10-17 DIAGNOSIS — I10 Essential (primary) hypertension: Secondary | ICD-10-CM | POA: Diagnosis not present

## 2016-10-17 DIAGNOSIS — R112 Nausea with vomiting, unspecified: Secondary | ICD-10-CM | POA: Diagnosis not present

## 2016-10-17 DIAGNOSIS — F419 Anxiety disorder, unspecified: Secondary | ICD-10-CM | POA: Diagnosis not present

## 2016-10-18 DIAGNOSIS — R079 Chest pain, unspecified: Secondary | ICD-10-CM | POA: Diagnosis not present

## 2016-10-18 DIAGNOSIS — F419 Anxiety disorder, unspecified: Secondary | ICD-10-CM | POA: Diagnosis not present

## 2016-10-18 DIAGNOSIS — R000 Tachycardia, unspecified: Secondary | ICD-10-CM | POA: Diagnosis not present

## 2016-10-18 DIAGNOSIS — I10 Essential (primary) hypertension: Secondary | ICD-10-CM | POA: Diagnosis not present

## 2016-10-18 DIAGNOSIS — J9690 Respiratory failure, unspecified, unspecified whether with hypoxia or hypercapnia: Secondary | ICD-10-CM | POA: Diagnosis not present

## 2016-10-18 DIAGNOSIS — E119 Type 2 diabetes mellitus without complications: Secondary | ICD-10-CM | POA: Diagnosis not present

## 2016-10-18 DIAGNOSIS — F6810 Factitious disorder, unspecified: Secondary | ICD-10-CM | POA: Diagnosis not present

## 2016-10-18 DIAGNOSIS — R1031 Right lower quadrant pain: Secondary | ICD-10-CM | POA: Diagnosis not present

## 2016-10-18 DIAGNOSIS — J45909 Unspecified asthma, uncomplicated: Secondary | ICD-10-CM | POA: Diagnosis not present

## 2016-10-18 DIAGNOSIS — R112 Nausea with vomiting, unspecified: Secondary | ICD-10-CM | POA: Diagnosis not present

## 2016-10-18 DIAGNOSIS — S0302XA Dislocation of jaw, left side, initial encounter: Secondary | ICD-10-CM | POA: Diagnosis not present

## 2016-10-18 DIAGNOSIS — R102 Pelvic and perineal pain: Secondary | ICD-10-CM | POA: Diagnosis not present

## 2016-10-19 DIAGNOSIS — R079 Chest pain, unspecified: Secondary | ICD-10-CM | POA: Diagnosis not present

## 2016-10-19 DIAGNOSIS — R000 Tachycardia, unspecified: Secondary | ICD-10-CM | POA: Diagnosis not present

## 2016-10-19 DIAGNOSIS — R112 Nausea with vomiting, unspecified: Secondary | ICD-10-CM | POA: Diagnosis not present

## 2016-10-19 DIAGNOSIS — F419 Anxiety disorder, unspecified: Secondary | ICD-10-CM | POA: Diagnosis not present

## 2016-10-19 DIAGNOSIS — R1031 Right lower quadrant pain: Secondary | ICD-10-CM | POA: Diagnosis not present

## 2016-10-19 DIAGNOSIS — I10 Essential (primary) hypertension: Secondary | ICD-10-CM | POA: Diagnosis not present

## 2016-10-19 DIAGNOSIS — E119 Type 2 diabetes mellitus without complications: Secondary | ICD-10-CM | POA: Diagnosis not present

## 2016-10-19 DIAGNOSIS — F6810 Factitious disorder, unspecified: Secondary | ICD-10-CM | POA: Diagnosis not present

## 2016-10-19 DIAGNOSIS — J45909 Unspecified asthma, uncomplicated: Secondary | ICD-10-CM | POA: Diagnosis not present

## 2016-10-19 DIAGNOSIS — R509 Fever, unspecified: Secondary | ICD-10-CM | POA: Diagnosis not present

## 2016-10-22 DIAGNOSIS — J4540 Moderate persistent asthma, uncomplicated: Secondary | ICD-10-CM | POA: Diagnosis not present

## 2016-10-22 DIAGNOSIS — I10 Essential (primary) hypertension: Secondary | ICD-10-CM | POA: Diagnosis not present

## 2016-10-22 DIAGNOSIS — J45998 Other asthma: Secondary | ICD-10-CM | POA: Diagnosis not present

## 2016-10-22 DIAGNOSIS — E119 Type 2 diabetes mellitus without complications: Secondary | ICD-10-CM | POA: Diagnosis not present

## 2016-10-22 DIAGNOSIS — G894 Chronic pain syndrome: Secondary | ICD-10-CM | POA: Diagnosis not present

## 2016-10-24 DIAGNOSIS — R069 Unspecified abnormalities of breathing: Secondary | ICD-10-CM | POA: Diagnosis not present

## 2016-10-24 DIAGNOSIS — I10 Essential (primary) hypertension: Secondary | ICD-10-CM | POA: Diagnosis not present

## 2016-10-24 DIAGNOSIS — F333 Major depressive disorder, recurrent, severe with psychotic symptoms: Secondary | ICD-10-CM | POA: Diagnosis not present

## 2016-10-24 DIAGNOSIS — G43009 Migraine without aura, not intractable, without status migrainosus: Secondary | ICD-10-CM | POA: Diagnosis not present

## 2016-10-24 DIAGNOSIS — G44209 Tension-type headache, unspecified, not intractable: Secondary | ICD-10-CM | POA: Diagnosis not present

## 2016-10-31 DIAGNOSIS — G43009 Migraine without aura, not intractable, without status migrainosus: Secondary | ICD-10-CM | POA: Diagnosis not present

## 2016-10-31 DIAGNOSIS — J0110 Acute frontal sinusitis, unspecified: Secondary | ICD-10-CM | POA: Diagnosis not present

## 2016-11-15 DIAGNOSIS — G43009 Migraine without aura, not intractable, without status migrainosus: Secondary | ICD-10-CM | POA: Diagnosis not present

## 2016-11-15 DIAGNOSIS — F445 Conversion disorder with seizures or convulsions: Secondary | ICD-10-CM | POA: Diagnosis not present

## 2016-11-15 DIAGNOSIS — F450 Somatization disorder: Secondary | ICD-10-CM | POA: Diagnosis not present

## 2016-11-15 DIAGNOSIS — G935 Compression of brain: Secondary | ICD-10-CM | POA: Diagnosis not present

## 2016-11-21 DIAGNOSIS — F609 Personality disorder, unspecified: Secondary | ICD-10-CM | POA: Diagnosis not present

## 2016-11-21 DIAGNOSIS — J4540 Moderate persistent asthma, uncomplicated: Secondary | ICD-10-CM | POA: Diagnosis not present

## 2016-11-21 DIAGNOSIS — F4323 Adjustment disorder with mixed anxiety and depressed mood: Secondary | ICD-10-CM | POA: Diagnosis not present

## 2016-11-21 DIAGNOSIS — F330 Major depressive disorder, recurrent, mild: Secondary | ICD-10-CM | POA: Diagnosis not present

## 2016-11-21 DIAGNOSIS — F444 Conversion disorder with motor symptom or deficit: Secondary | ICD-10-CM | POA: Diagnosis not present

## 2016-11-22 DIAGNOSIS — E119 Type 2 diabetes mellitus without complications: Secondary | ICD-10-CM | POA: Diagnosis not present

## 2016-11-22 DIAGNOSIS — I10 Essential (primary) hypertension: Secondary | ICD-10-CM | POA: Diagnosis not present

## 2016-11-22 DIAGNOSIS — J45998 Other asthma: Secondary | ICD-10-CM | POA: Diagnosis not present

## 2016-11-22 DIAGNOSIS — G894 Chronic pain syndrome: Secondary | ICD-10-CM | POA: Diagnosis not present

## 2016-11-24 DIAGNOSIS — J0141 Acute recurrent pansinusitis: Secondary | ICD-10-CM | POA: Insufficient documentation

## 2016-11-24 DIAGNOSIS — J0101 Acute recurrent maxillary sinusitis: Secondary | ICD-10-CM | POA: Insufficient documentation

## 2016-11-24 DIAGNOSIS — S30811A Abrasion of abdominal wall, initial encounter: Secondary | ICD-10-CM | POA: Diagnosis not present

## 2016-11-24 HISTORY — DX: Acute recurrent pansinusitis: J01.41

## 2016-12-12 DIAGNOSIS — E861 Hypovolemia: Secondary | ICD-10-CM

## 2016-12-12 DIAGNOSIS — R509 Fever, unspecified: Secondary | ICD-10-CM | POA: Diagnosis not present

## 2016-12-12 DIAGNOSIS — E860 Dehydration: Secondary | ICD-10-CM | POA: Diagnosis not present

## 2016-12-12 DIAGNOSIS — I9589 Other hypotension: Secondary | ICD-10-CM | POA: Diagnosis not present

## 2016-12-12 DIAGNOSIS — H6690 Otitis media, unspecified, unspecified ear: Secondary | ICD-10-CM | POA: Diagnosis not present

## 2016-12-12 HISTORY — DX: Hypovolemia: E86.1

## 2016-12-22 DIAGNOSIS — J4540 Moderate persistent asthma, uncomplicated: Secondary | ICD-10-CM | POA: Diagnosis not present

## 2016-12-22 DIAGNOSIS — J45998 Other asthma: Secondary | ICD-10-CM | POA: Diagnosis not present

## 2016-12-22 DIAGNOSIS — I10 Essential (primary) hypertension: Secondary | ICD-10-CM | POA: Diagnosis not present

## 2016-12-22 DIAGNOSIS — G894 Chronic pain syndrome: Secondary | ICD-10-CM | POA: Diagnosis not present

## 2016-12-22 DIAGNOSIS — E119 Type 2 diabetes mellitus without complications: Secondary | ICD-10-CM | POA: Diagnosis not present

## 2016-12-25 DIAGNOSIS — R0981 Nasal congestion: Secondary | ICD-10-CM | POA: Diagnosis not present

## 2016-12-27 DIAGNOSIS — G43009 Migraine without aura, not intractable, without status migrainosus: Secondary | ICD-10-CM | POA: Diagnosis not present

## 2017-01-08 DIAGNOSIS — J029 Acute pharyngitis, unspecified: Secondary | ICD-10-CM | POA: Diagnosis not present

## 2017-01-11 DIAGNOSIS — F332 Major depressive disorder, recurrent severe without psychotic features: Secondary | ICD-10-CM | POA: Diagnosis not present

## 2017-01-11 DIAGNOSIS — Z634 Disappearance and death of family member: Secondary | ICD-10-CM | POA: Diagnosis not present

## 2017-01-11 DIAGNOSIS — F419 Anxiety disorder, unspecified: Secondary | ICD-10-CM | POA: Diagnosis not present

## 2017-01-11 DIAGNOSIS — Z639 Problem related to primary support group, unspecified: Secondary | ICD-10-CM | POA: Diagnosis not present

## 2017-01-21 DIAGNOSIS — J4540 Moderate persistent asthma, uncomplicated: Secondary | ICD-10-CM | POA: Diagnosis not present

## 2017-01-22 DIAGNOSIS — E119 Type 2 diabetes mellitus without complications: Secondary | ICD-10-CM | POA: Diagnosis not present

## 2017-01-22 DIAGNOSIS — I10 Essential (primary) hypertension: Secondary | ICD-10-CM | POA: Diagnosis not present

## 2017-01-22 DIAGNOSIS — J45998 Other asthma: Secondary | ICD-10-CM | POA: Diagnosis not present

## 2017-01-22 DIAGNOSIS — G894 Chronic pain syndrome: Secondary | ICD-10-CM | POA: Diagnosis not present

## 2017-02-12 DIAGNOSIS — I951 Orthostatic hypotension: Secondary | ICD-10-CM

## 2017-02-12 HISTORY — DX: Orthostatic hypotension: I95.1

## 2017-02-17 MED ORDER — KETOROLAC TROMETHAMINE 30 MG/ML IJ SOLN
30.00 | INTRAMUSCULAR | Status: DC
Start: 2017-02-17 — End: 2017-02-17

## 2017-02-17 MED ORDER — SODIUM CHLORIDE 0.9 % IV SOLN
1000.00 | INTRAVENOUS | Status: DC
Start: 2017-02-17 — End: 2017-02-17

## 2017-02-17 MED ORDER — ONDANSETRON HCL 4 MG/2ML IJ SOLN
4.00 | INTRAMUSCULAR | Status: DC
Start: 2017-02-17 — End: 2017-02-17

## 2017-02-19 ENCOUNTER — Encounter (HOSPITAL_COMMUNITY): Payer: Self-pay | Admitting: *Deleted

## 2017-02-19 ENCOUNTER — Inpatient Hospital Stay (HOSPITAL_COMMUNITY)
Admission: AD | Admit: 2017-02-19 | Discharge: 2017-02-22 | DRG: 885 | Disposition: A | Discharge status: Other Acute Inpatient Hospital | Payer: Medicare PPO | Source: Intra-hospital | Attending: Internal Medicine | Admitting: Internal Medicine

## 2017-02-19 ENCOUNTER — Inpatient Hospital Stay (EMERGENCY_DEPARTMENT_HOSPITAL)
Admission: AD | Admit: 2017-02-19 | Discharge: 2017-02-19 | Disposition: A | Discharge status: Other Acute Inpatient Hospital | Payer: Medicare PPO | Source: Ambulatory Visit | Attending: Obstetrics and Gynecology | Admitting: Obstetrics and Gynecology

## 2017-02-19 ENCOUNTER — Other Ambulatory Visit: Payer: Self-pay

## 2017-02-19 DIAGNOSIS — Z915 Personal history of self-harm: Secondary | ICD-10-CM | POA: Diagnosis not present

## 2017-02-19 DIAGNOSIS — Z7982 Long term (current) use of aspirin: Secondary | ICD-10-CM

## 2017-02-19 DIAGNOSIS — G935 Compression of brain: Secondary | ICD-10-CM | POA: Diagnosis present

## 2017-02-19 DIAGNOSIS — Z62810 Personal history of physical and sexual abuse in childhood: Secondary | ICD-10-CM | POA: Diagnosis present

## 2017-02-19 DIAGNOSIS — R112 Nausea with vomiting, unspecified: Secondary | ICD-10-CM

## 2017-02-19 DIAGNOSIS — Z87891 Personal history of nicotine dependence: Secondary | ICD-10-CM

## 2017-02-19 DIAGNOSIS — Z8249 Family history of ischemic heart disease and other diseases of the circulatory system: Secondary | ICD-10-CM

## 2017-02-19 DIAGNOSIS — B373 Candidiasis of vulva and vagina: Secondary | ICD-10-CM | POA: Insufficient documentation

## 2017-02-19 DIAGNOSIS — G43009 Migraine without aura, not intractable, without status migrainosus: Secondary | ICD-10-CM

## 2017-02-19 DIAGNOSIS — Z634 Disappearance and death of family member: Secondary | ICD-10-CM

## 2017-02-19 DIAGNOSIS — J45909 Unspecified asthma, uncomplicated: Secondary | ICD-10-CM | POA: Diagnosis present

## 2017-02-19 DIAGNOSIS — Z818 Family history of other mental and behavioral disorders: Secondary | ICD-10-CM | POA: Diagnosis not present

## 2017-02-19 DIAGNOSIS — Z881 Allergy status to other antibiotic agents status: Secondary | ICD-10-CM

## 2017-02-19 DIAGNOSIS — Z833 Family history of diabetes mellitus: Secondary | ICD-10-CM

## 2017-02-19 DIAGNOSIS — Z7984 Long term (current) use of oral hypoglycemic drugs: Secondary | ICD-10-CM

## 2017-02-19 DIAGNOSIS — N390 Urinary tract infection, site not specified: Secondary | ICD-10-CM

## 2017-02-19 DIAGNOSIS — Z91411 Personal history of adult psychological abuse: Secondary | ICD-10-CM | POA: Diagnosis not present

## 2017-02-19 DIAGNOSIS — G4700 Insomnia, unspecified: Secondary | ICD-10-CM | POA: Diagnosis present

## 2017-02-19 DIAGNOSIS — Z79899 Other long term (current) drug therapy: Secondary | ICD-10-CM

## 2017-02-19 DIAGNOSIS — R45851 Suicidal ideations: Secondary | ICD-10-CM | POA: Diagnosis present

## 2017-02-19 DIAGNOSIS — R109 Unspecified abdominal pain: Secondary | ICD-10-CM

## 2017-02-19 DIAGNOSIS — G9349 Other encephalopathy: Secondary | ICD-10-CM | POA: Diagnosis present

## 2017-02-19 DIAGNOSIS — M549 Dorsalgia, unspecified: Secondary | ICD-10-CM | POA: Diagnosis not present

## 2017-02-19 DIAGNOSIS — F4310 Post-traumatic stress disorder, unspecified: Secondary | ICD-10-CM | POA: Diagnosis present

## 2017-02-19 DIAGNOSIS — F450 Somatization disorder: Secondary | ICD-10-CM | POA: Diagnosis present

## 2017-02-19 DIAGNOSIS — I10 Essential (primary) hypertension: Secondary | ICD-10-CM | POA: Diagnosis present

## 2017-02-19 DIAGNOSIS — F902 Attention-deficit hyperactivity disorder, combined type: Secondary | ICD-10-CM | POA: Diagnosis not present

## 2017-02-19 DIAGNOSIS — K219 Gastro-esophageal reflux disease without esophagitis: Secondary | ICD-10-CM | POA: Diagnosis present

## 2017-02-19 DIAGNOSIS — N83201 Unspecified ovarian cyst, right side: Secondary | ICD-10-CM | POA: Insufficient documentation

## 2017-02-19 DIAGNOSIS — I160 Hypertensive urgency: Secondary | ICD-10-CM | POA: Diagnosis not present

## 2017-02-19 DIAGNOSIS — Z3202 Encounter for pregnancy test, result negative: Secondary | ICD-10-CM

## 2017-02-19 DIAGNOSIS — J309 Allergic rhinitis, unspecified: Secondary | ICD-10-CM | POA: Diagnosis present

## 2017-02-19 DIAGNOSIS — F319 Bipolar disorder, unspecified: Secondary | ICD-10-CM | POA: Diagnosis present

## 2017-02-19 DIAGNOSIS — G9340 Encephalopathy, unspecified: Secondary | ICD-10-CM | POA: Diagnosis not present

## 2017-02-19 DIAGNOSIS — F32A Depression, unspecified: Secondary | ICD-10-CM | POA: Diagnosis present

## 2017-02-19 DIAGNOSIS — F909 Attention-deficit hyperactivity disorder, unspecified type: Secondary | ICD-10-CM | POA: Diagnosis present

## 2017-02-19 DIAGNOSIS — F419 Anxiety disorder, unspecified: Secondary | ICD-10-CM | POA: Diagnosis present

## 2017-02-19 DIAGNOSIS — Z7951 Long term (current) use of inhaled steroids: Secondary | ICD-10-CM | POA: Diagnosis not present

## 2017-02-19 DIAGNOSIS — R51 Headache: Secondary | ICD-10-CM | POA: Diagnosis present

## 2017-02-19 DIAGNOSIS — Z9049 Acquired absence of other specified parts of digestive tract: Secondary | ICD-10-CM

## 2017-02-19 DIAGNOSIS — G8929 Other chronic pain: Secondary | ICD-10-CM | POA: Insufficient documentation

## 2017-02-19 DIAGNOSIS — R441 Visual hallucinations: Secondary | ICD-10-CM | POA: Diagnosis not present

## 2017-02-19 DIAGNOSIS — Z62811 Personal history of psychological abuse in childhood: Secondary | ICD-10-CM | POA: Diagnosis not present

## 2017-02-19 DIAGNOSIS — Z888 Allergy status to other drugs, medicaments and biological substances status: Secondary | ICD-10-CM

## 2017-02-19 DIAGNOSIS — Z79891 Long term (current) use of opiate analgesic: Secondary | ICD-10-CM

## 2017-02-19 DIAGNOSIS — Z882 Allergy status to sulfonamides status: Secondary | ICD-10-CM | POA: Diagnosis not present

## 2017-02-19 DIAGNOSIS — E119 Type 2 diabetes mellitus without complications: Secondary | ICD-10-CM | POA: Diagnosis present

## 2017-02-19 DIAGNOSIS — E1143 Type 2 diabetes mellitus with diabetic autonomic (poly)neuropathy: Secondary | ICD-10-CM | POA: Diagnosis present

## 2017-02-19 DIAGNOSIS — Z880 Allergy status to penicillin: Secondary | ICD-10-CM

## 2017-02-19 DIAGNOSIS — F314 Bipolar disorder, current episode depressed, severe, without psychotic features: Secondary | ICD-10-CM | POA: Diagnosis not present

## 2017-02-19 DIAGNOSIS — Z560 Unemployment, unspecified: Secondary | ICD-10-CM | POA: Diagnosis not present

## 2017-02-19 DIAGNOSIS — Z6379 Other stressful life events affecting family and household: Secondary | ICD-10-CM | POA: Diagnosis not present

## 2017-02-19 DIAGNOSIS — D72829 Elevated white blood cell count, unspecified: Secondary | ICD-10-CM | POA: Diagnosis not present

## 2017-02-19 DIAGNOSIS — R1032 Left lower quadrant pain: Secondary | ICD-10-CM

## 2017-02-19 DIAGNOSIS — F445 Conversion disorder with seizures or convulsions: Secondary | ICD-10-CM | POA: Diagnosis present

## 2017-02-19 DIAGNOSIS — N83202 Unspecified ovarian cyst, left side: Secondary | ICD-10-CM | POA: Insufficient documentation

## 2017-02-19 DIAGNOSIS — T39014A Poisoning by aspirin, undetermined, initial encounter: Secondary | ICD-10-CM | POA: Diagnosis not present

## 2017-02-19 DIAGNOSIS — R450 Nervousness: Secondary | ICD-10-CM | POA: Diagnosis not present

## 2017-02-19 DIAGNOSIS — F39 Unspecified mood [affective] disorder: Secondary | ICD-10-CM | POA: Diagnosis not present

## 2017-02-19 DIAGNOSIS — F329 Major depressive disorder, single episode, unspecified: Secondary | ICD-10-CM | POA: Diagnosis present

## 2017-02-19 DIAGNOSIS — I674 Hypertensive encephalopathy: Secondary | ICD-10-CM | POA: Diagnosis not present

## 2017-02-19 DIAGNOSIS — F410 Panic disorder [episodic paroxysmal anxiety] without agoraphobia: Secondary | ICD-10-CM | POA: Diagnosis present

## 2017-02-19 DIAGNOSIS — N83209 Unspecified ovarian cyst, unspecified side: Secondary | ICD-10-CM | POA: Diagnosis not present

## 2017-02-19 DIAGNOSIS — K760 Fatty (change of) liver, not elsewhere classified: Secondary | ICD-10-CM | POA: Diagnosis present

## 2017-02-19 DIAGNOSIS — F3130 Bipolar disorder, current episode depressed, mild or moderate severity, unspecified: Secondary | ICD-10-CM | POA: Diagnosis present

## 2017-02-19 DIAGNOSIS — B379 Candidiasis, unspecified: Secondary | ICD-10-CM

## 2017-02-19 DIAGNOSIS — R440 Auditory hallucinations: Secondary | ICD-10-CM | POA: Diagnosis not present

## 2017-02-19 HISTORY — DX: Bipolar disorder, unspecified: F31.9

## 2017-02-19 HISTORY — DX: Bipolar disorder, current episode depressed, mild or moderate severity, unspecified: F31.30

## 2017-02-19 LAB — CBC WITH DIFFERENTIAL/PLATELET
Basophils Absolute: 0 10*3/uL (ref 0.0–0.1)
Basophils Relative: 0 %
EOS PCT: 1 %
Eosinophils Absolute: 0.1 10*3/uL (ref 0.0–0.7)
HEMATOCRIT: 32.8 % — AB (ref 36.0–46.0)
Hemoglobin: 10.4 g/dL — ABNORMAL LOW (ref 12.0–15.0)
LYMPHS ABS: 2.9 10*3/uL (ref 0.7–4.0)
LYMPHS PCT: 31 %
MCH: 28.6 pg (ref 26.0–34.0)
MCHC: 31.7 g/dL (ref 30.0–36.0)
MCV: 90.1 fL (ref 78.0–100.0)
Monocytes Absolute: 0.4 10*3/uL (ref 0.1–1.0)
Monocytes Relative: 4 %
NEUTROS PCT: 64 %
Neutro Abs: 6.1 10*3/uL (ref 1.7–7.7)
Other: 0 %
PLATELETS: 331 10*3/uL (ref 150–400)
RBC: 3.64 MIL/uL — AB (ref 3.87–5.11)
RDW: 14.7 % (ref 11.5–15.5)
WBC: 9.5 10*3/uL (ref 4.0–10.5)

## 2017-02-19 LAB — URINALYSIS, ROUTINE W REFLEX MICROSCOPIC
Bilirubin Urine: NEGATIVE
GLUCOSE, UA: NEGATIVE mg/dL
Ketones, ur: 20 mg/dL — AB
NITRITE: NEGATIVE
Protein, ur: 30 mg/dL — AB
SPECIFIC GRAVITY, URINE: 1.018 (ref 1.005–1.030)
pH: 5 (ref 5.0–8.0)

## 2017-02-19 LAB — COMPREHENSIVE METABOLIC PANEL
ALK PHOS: 67 U/L (ref 38–126)
ALT: 21 U/L (ref 14–54)
AST: 22 U/L (ref 15–41)
Albumin: 3.7 g/dL (ref 3.5–5.0)
Anion gap: 13 (ref 5–15)
BUN: <5 mg/dL — ABNORMAL LOW (ref 6–20)
CALCIUM: 8.6 mg/dL — AB (ref 8.9–10.3)
CO2: 19 mmol/L — ABNORMAL LOW (ref 22–32)
CREATININE: 0.82 mg/dL (ref 0.44–1.00)
Chloride: 103 mmol/L (ref 101–111)
Glucose, Bld: 103 mg/dL — ABNORMAL HIGH (ref 65–99)
Potassium: 4.3 mmol/L (ref 3.5–5.1)
Sodium: 135 mmol/L (ref 135–145)
Total Bilirubin: 0.3 mg/dL (ref 0.3–1.2)
Total Protein: 7 g/dL (ref 6.5–8.1)

## 2017-02-19 LAB — POCT PREGNANCY, URINE: PREG TEST UR: NEGATIVE

## 2017-02-19 LAB — GLUCOSE, CAPILLARY: Glucose-Capillary: 169 mg/dL — ABNORMAL HIGH (ref 65–99)

## 2017-02-19 MED ORDER — ALUM & MAG HYDROXIDE-SIMETH 200-200-20 MG/5ML PO SUSP
30.0000 mL | ORAL | Status: DC | PRN
  Administered 2017-02-20: 30 mL via ORAL
  Filled 2017-02-19: qty 30

## 2017-02-19 MED ORDER — IBUPROFEN 200 MG PO TABS
600.0000 mg | ORAL_TABLET | Freq: Four times a day (QID) | ORAL | Status: DC | PRN
  Administered 2017-02-20 – 2017-02-21 (×3): 600 mg via ORAL
  Filled 2017-02-19 (×3): qty 1

## 2017-02-19 MED ORDER — ASPIRIN 81 MG PO CHEW
CHEWABLE_TABLET | ORAL | Status: AC
  Administered 2017-02-19: 324 mg
  Filled 2017-02-19: qty 4

## 2017-02-19 MED ORDER — TRAMADOL HCL 50 MG PO TABS
50.0000 mg | ORAL_TABLET | Freq: Once | ORAL | Status: DC
  Filled 2017-02-19: qty 1

## 2017-02-19 MED ORDER — ACETAMINOPHEN 325 MG PO TABS
650.0000 mg | ORAL_TABLET | Freq: Four times a day (QID) | ORAL | Status: DC | PRN
  Administered 2017-02-20 – 2017-02-21 (×2): 650 mg via ORAL
  Filled 2017-02-19 (×2): qty 2

## 2017-02-19 MED ORDER — HYDROXYZINE HCL 25 MG PO TABS
25.0000 mg | ORAL_TABLET | Freq: Four times a day (QID) | ORAL | Status: DC | PRN
  Administered 2017-02-19 – 2017-02-20 (×4): 25 mg via ORAL
  Filled 2017-02-19 (×4): qty 1

## 2017-02-19 MED ORDER — MAGNESIUM HYDROXIDE 400 MG/5ML PO SUSP: 30.0000 mL | Freq: Every day | ORAL | Status: DC | PRN

## 2017-02-19 MED ORDER — TRAZODONE HCL 50 MG PO TABS
50.0000 mg | ORAL_TABLET | Freq: Every evening | ORAL | Status: DC | PRN
  Administered 2017-02-19 – 2017-02-22 (×3): 50 mg via ORAL
  Filled 2017-02-19 (×3): qty 1
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
## 2017-02-19 NOTE — Progress Notes (Signed)
CSW received call from MAU RN stating that patient has been deemed appropriate for inpatient admission due to [place] is available for support as desired by patient while she is here, but does not plan to complete assessment as she will be admitted to an inpatient facility for further treatment, support, and resources.

## 2017-02-19 NOTE — MAU Note (Signed)
Has bilateral ovarian cysts.  Dx on Friday at [facility].  Medication is not helping the pain.  Has passed out twice today.  Feeling dizzy, sharp pain, both sides- left is worse.  Has been running fever off and on, was 102.0 at midnight
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
## 2017-02-19 NOTE — ED Triage Notes (Signed)
Pt BIB [facility] from [facility] for substernal chest pain that started after receiving night time medications (25mg  vistaril, 50mg  trazadone). Pain worse with palpation and movement. Denies recent fall/trauma. Given 324 aspirin PTA.
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
## 2017-02-19 NOTE — Care Management (Signed)
Per [facility] Ria Comment) - pt accepted to [facility] Bed 401-2.  Dr. Parke Poisson is the accepting.  The patient can come after she is medically cleared.  The patient can come after 8pm.  Lyndsay spoke to the RN at [facility] [place]).

## 2017-02-19 NOTE — [facility] Counselor (Signed)
Pt is recommended for inpatient admission once medically clear. Pt will not be reviewed until medical clearance is obtained.   [address] [place], LCAS

## 2017-02-19 NOTE — MAU Note (Signed)
Urine in lab 

## 2017-02-19 NOTE — Tx Team (Signed)
Initial Treatment Plan 02/19/2017 9:51 PM Meredith Cherry XNT:[id number]    PATIENT STRESSORS: Health problems Marital or family conflict Traumatic event   PATIENT STRENGTHS: Ability for insight Average or above average intelligence Capable of independent living General fund of knowledge Motivation for treatment/growth   PATIENT IDENTIFIED PROBLEMS: Depression Anxiety Crying spells Suicidal thoughts "My nerves are shot" "I can't sleep"                     DISCHARGE CRITERIA:  Ability to meet basic life and health needs Improved stabilization in mood, thinking, and/or behavior Reduction of life-threatening or endangering symptoms to within safe limits Verbal commitment to aftercare and medication compliance  PRELIMINARY DISCHARGE PLAN: Attend aftercare/continuing care group Return to previous living arrangement  PATIENT/FAMILY INVOLVEMENT: This treatment plan has been presented to and reviewed with the patient, Meredith Cherry, and/or family member, .  The patient and family have been given the opportunity to ask questions and make suggestions.  [name], [place], [place] 02/19/2017, 9:51 PM
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
## 2017-02-20 ENCOUNTER — Inpatient Hospital Stay (HOSPITAL_COMMUNITY): Payer: Medicare PPO

## 2017-02-20 DIAGNOSIS — Z62810 Personal history of physical and sexual abuse in childhood: Secondary | ICD-10-CM

## 2017-02-20 DIAGNOSIS — Z62811 Personal history of psychological abuse in childhood: Secondary | ICD-10-CM

## 2017-02-20 DIAGNOSIS — E119 Type 2 diabetes mellitus without complications: Secondary | ICD-10-CM

## 2017-02-20 DIAGNOSIS — N390 Urinary tract infection, site not specified: Secondary | ICD-10-CM

## 2017-02-20 DIAGNOSIS — D72829 Elevated white blood cell count, unspecified: Secondary | ICD-10-CM

## 2017-02-20 DIAGNOSIS — T39014A Poisoning by aspirin, undetermined, initial encounter: Secondary | ICD-10-CM

## 2017-02-20 DIAGNOSIS — I10 Essential (primary) hypertension: Secondary | ICD-10-CM

## 2017-02-20 DIAGNOSIS — Z91411 Personal history of adult psychological abuse: Secondary | ICD-10-CM

## 2017-02-20 DIAGNOSIS — F314 Bipolar disorder, current episode depressed, severe, without psychotic features: Secondary | ICD-10-CM

## 2017-02-20 DIAGNOSIS — N83209 Unspecified ovarian cyst, unspecified side: Secondary | ICD-10-CM

## 2017-02-20 DIAGNOSIS — Z6379 Other stressful life events affecting family and household: Secondary | ICD-10-CM

## 2017-02-20 DIAGNOSIS — Z915 Personal history of self-harm: Secondary | ICD-10-CM

## 2017-02-20 LAB — BASIC METABOLIC PANEL
ANION GAP: 13 (ref 5–15)
BUN: 5 mg/dL — AB (ref 6–20)
CALCIUM: 8.9 mg/dL (ref 8.9–10.3)
CO2: 22 mmol/L (ref 22–32)
Chloride: 104 mmol/L (ref 101–111)
Creatinine, Ser: 0.88 mg/dL (ref 0.44–1.00)
GFR calc Af Amer: >60 mL/min (ref >60)
GFR calc non Af Amer: >60 mL/min (ref >60)
GLUCOSE: 156 mg/dL — AB (ref 65–99)
Potassium: 3.8 mmol/L (ref 3.5–5.1)
Sodium: 139 mmol/L (ref 135–145)

## 2017-02-20 LAB — I-STAT BETA HCG BLOOD, ED (MC, WL, AP ONLY): I-stat hCG, quantitative: <5 m[IU]/mL (ref <5)

## 2017-02-20 LAB — CBC
HCT: 37.9 % (ref 36.0–46.0)
HEMOGLOBIN: 12.1 g/dL (ref 12.0–15.0)
MCH: 28.9 pg (ref 26.0–34.0)
MCHC: 31.9 g/dL (ref 30.0–36.0)
MCV: 90.5 fL (ref 78.0–100.0)
Platelets: 375 10*3/uL (ref 150–400)
RBC: 4.19 MIL/uL (ref 3.87–5.11)
RDW: 14.7 % (ref 11.5–15.5)
WBC: 11.2 10*3/uL — ABNORMAL HIGH (ref 4.0–10.5)

## 2017-02-20 LAB — I-STAT TROPONIN, ED: TROPONIN I, POC: 0 ng/mL (ref 0.00–0.08)

## 2017-02-20 LAB — D-DIMER, QUANTITATIVE (NOT AT ARMC): D DIMER QUANT: 0.38 ug{FEU}/mL (ref 0.00–0.50)

## 2017-02-20 MED ORDER — MIRTAZAPINE 30 MG PO TABS
15.0000 mg | ORAL_TABLET | Freq: Every day | ORAL | Status: DC
  Administered 2017-02-20 – 2017-02-22 (×2): 15 mg via ORAL
  Filled 2017-02-20 (×5): qty 1

## 2017-02-20 MED ORDER — PRAZOSIN HCL 1 MG PO CAPS
4.0000 mg | ORAL_CAPSULE | Freq: Every day | ORAL | Status: DC
  Administered 2017-02-20: 4 mg via ORAL
  Filled 2017-02-20 (×3): qty 2
  Filled 2017-02-20: qty 4

## 2017-02-20 MED ORDER — METOPROLOL TARTRATE 25 MG PO TABS
25.0000 mg | ORAL_TABLET | Freq: Two times a day (BID) | ORAL | Status: DC
  Administered 2017-02-20 – 2017-02-22 (×3): 25 mg via ORAL
  Filled 2017-02-20 (×9): qty 1

## 2017-02-20 MED ORDER — ARIPIPRAZOLE 5 MG PO TABS
5.0000 mg | ORAL_TABLET | Freq: Every day | ORAL | Status: DC
  Administered 2017-02-20 – 2017-02-22 (×3): 5 mg via ORAL
  Filled 2017-02-20 (×8): qty 1

## 2017-02-20 MED ORDER — ACETAMINOPHEN 325 MG PO TABS
650.0000 mg | ORAL_TABLET | Freq: Once | ORAL | Status: AC
  Administered 2017-02-20: 650 mg via ORAL
  Filled 2017-02-20: qty 2

## 2017-02-20 MED ORDER — METFORMIN HCL 500 MG PO TABS
1000.0000 mg | ORAL_TABLET | Freq: Two times a day (BID) | ORAL | Status: DC
  Administered 2017-02-20 – 2017-02-21 (×2): 1000 mg via ORAL
  Filled 2017-02-20 (×8): qty 2

## 2017-02-20 MED ORDER — NITROFURANTOIN MONOHYD MACRO 100 MG PO CAPS
100.0000 mg | ORAL_CAPSULE | Freq: Two times a day (BID) | ORAL | Status: DC
  Administered 2017-02-20 – 2017-02-22 (×4): 100 mg via ORAL
  Filled 2017-02-20 (×12): qty 1

## 2017-02-20 NOTE — ED Notes (Signed)
Sam-Pelham called @ 0735/Transfer to [employer] RN

## 2017-02-20 NOTE — Significant Event (Cosign Needed)
S:Notified by Rn, patient was symptomatic of non exertional SSCP symptoms for about one hour in duration. No report of radiation of pain, N/V, diaphoresis or acute onset weakness. Patient with reported hx of syncope of no known etiology. Patient denies hx of known CAD, or prior MI. There is a reported ist degree relative with hx of premature CVA. O: Gen 47 y/o WF looking older than stated age, in no distress Integument: W/D, normal tone Pulm: CTA Cardio: audible S1,s1 no M/G/R Chest: reproducible chest wall tenderness Substernal area Ext: No pedal edema Neuro: CN 2 - 12 intact EKG: Non specific T wave changes with slightly prolonged QT interval V/S: elevated BP, no nitrates given due to concurrent syncope A/p: CP, r/o ACS * 325 baby asa given * supplemental o2 given * EMS transport to [facility] ED

## 2017-02-20 NOTE — ED Provider Notes (Signed)
[facility] EMERGENCY DEPARTMENT Provider Note   CSN: [id number] Arrival date & time: 02/19/17  2306     History   Chief Complaint Chief Complaint  Patient presents with  . Chest Pain    HPI Meredith Cherry is a 47 y.o. female.  Patient presents with left chest pain that started around 6:00 pm last night while at rest. It has been constant since that time. No SoB but she reports pleuritic pain. No cough or fever. She has nausea without vomiting. No history of similar symptoms. She is a patient at [facility] for treatment of SI and overdose of aspirin after having taken 10 pills. She was medically cleared and accepted/sent to [facility] for treatment. Chest pain started after arrival to [facility]. It is constant. It is worse with moving but not to palpation. No back pain.   The history is provided by the patient and the nursing home. No language interpreter was used.  Chest Pain   Associated symptoms include nausea. Pertinent negatives include no back pain, no fever, no shortness of breath and no vomiting.    Past Medical History:  Diagnosis Date  . ADHD (attention deficit hyperactivity disorder)   . Allergic rhinitis   . Anxiety   . Asthma   . Bipolar 1 disorder ([place])   . Chiari malformation type I ([place])   . Chronic migraine   . Depression   . Diabetes mellitus without complication ([place])   . GERD (gastroesophageal reflux disease)   . Hypertension   . Panic attack   . Psychiatric disorder     Patient Active Problem List   Diagnosis Date Noted  . Bipolar affect, depressed ([place]) 02/19/2017  . History of Chiari malformation 11/15/2015  . 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 Prolonged QT interval 11/15/2015  . Panic attack   . Hypertension   . GERD (gastroesophageal reflux disease)   . Depression   . Anxiety   . ADHD (attention deficit hyperactivity disorder)   . Acute encephalopathy   . 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 Snoring 12/07/2011    Past Surgical History:    Procedure Laterality Date  . CHOLECYSTECTOMY    . TUBAL LIGATION      OB History    Gravida Para Term Preterm AB Living   2 2 2     2    SAB TAB Ectopic Multiple Live Births                   Home Medications    Prior to Admission medications   Medication Sig Start Date End Date Taking? Authorizing Provider  albuterol (PROVENTIL) (2.5 MG/3ML) 0.083% nebulizer solution Take 2.5 mg by nebulization every 6 (six) hours as needed for wheezing or shortness of breath.    Yes [provider]  aspirin EC 81 MG EC tablet Take 1 tablet (81 mg total) by mouth daily. 11/18/15  Yes Sheikh, Omair Latif, DO  clonazePAM (KLONOPIN) 0.5 MG tablet Take 1 tablet (0.5 mg total) by mouth 3 (three) times daily. 11/17/15  Yes Sheikh, Omair Latif, DO  cloNIDine (CATAPRES) 0.1 MG tablet Take 0.1 mg by mouth 2 (two) times daily.   Yes [provider]  diltiazem (CARDIZEM CD) 240 MG 24 hr capsule Take 240 mg by mouth daily.   Yes [provider]  divalproex (DEPAKOTE) 500 MG DR tablet Take 1 tablet (500 mg total) by mouth every 12 (twelve) hours. 11/17/15  Yes Sheikh, Omair Latif, DO  EPINEPHrine 0.15 MG/0.15ML IJ injection  Inject 0.15 mg into the muscle as needed for anaphylaxis.    Yes [provider]  ibuprofen (ADVIL,MOTRIN) 800 MG tablet Take 800 mg by mouth 3 (three) times daily.   Yes [provider]  losartan (COZAAR) 100 MG tablet Take 100 mg by mouth daily. 10/24/15  Yes [provider]  metFORMIN (GLUCOPHAGE) 1000 MG tablet Take 1,000 mg by mouth 2 (two) times daily.   Yes [provider]  nitroGLYCERIN (NITROSTAT) 0.4 MG SL tablet Place 0.4 mg under the tongue every 5 (five) minutes as needed for chest pain.    Yes [provider]  omeprazole (PRILOSEC) 20 MG capsule Take 20 mg by mouth daily. 11/01/15  Yes [provider]  pravastatin (PRAVACHOL) 20 MG tablet Take 20 mg by mouth daily.  09/27/15  Yes [provider]   QUEtiapine (SEROQUEL) 300 MG tablet Take 300 mg by mouth at bedtime.   Yes [provider]  ranitidine (ZANTAC) 150 MG tablet Take 150 mg by mouth at bedtime. 08/23/15  Yes [provider]  SUMAtriptan (IMITREX) 100 MG tablet Take 100 mg by mouth every 2 (two) hours as needed for migraine.  08/23/15  Yes [provider]  tiZANidine (ZANAFLEX) 2 MG tablet Take 2 mg by mouth every 6 (six) hours as needed for muscle spasms.   Yes [provider]  topiramate (TOPAMAX) 100 MG tablet Take 100 mg by mouth daily as needed (headache).    Yes [provider]  traMADol (ULTRAM) 50 MG tablet Take 50 mg by mouth every 6 (six) hours as needed for moderate pain.   Yes [provider]  traZODone (DESYREL) 50 MG tablet Take 50 mg by mouth at bedtime.   Yes [provider]  gabapentin (NEURONTIN) 400 MG capsule Take 1 capsule (400 mg total) by mouth 3 (three) times daily. Patient not taking: Reported on 02/20/2017 11/17/15   Raiford Noble Latif, DO  prazosin (MINIPRESS) 2 MG capsule Take 1 capsule (2 mg total) by mouth at bedtime. Patient not taking: Reported on 02/20/2017 11/17/15   Kerney Elbe, DO    Family History Family History  Problem Relation Age of Onset  . Stroke Mother        x 5  . Hypertension Mother   . Heart disease Mother   . Diabetes Mother   . Diabetes Father   . Heart disease Maternal Grandmother   . Breast cancer Paternal Grandmother   . Asthma Brother     Social History Social History   Tobacco Use  . Smoking status: Former Smoker    Types: Cigarettes  . Smokeless tobacco: Never Used  Substance Use Topics  . Alcohol use: No  . Drug use: No     Allergies   Bactrim [sulfamethoxazole-trimethoprim]; Ciprofloxacin; Penicillins; and Sulfa antibiotics   Review of Systems Review of Systems  Constitutional: Negative for chills and fever.  HENT: Negative.   Respiratory: Negative for shortness of breath.         See HPI.  Cardiovascular: Positive for chest pain.  Gastrointestinal: Positive for nausea. Negative for vomiting.  Musculoskeletal: Negative.  Negative for back pain.  Skin: Negative.   Neurological: Negative.      Physical Exam Updated Vital Signs BP (!) 186/107 (BP Location: Right Arm)   Pulse (!) 108   Temp 97.8 F (36.6 C) (Oral)   Resp 20   Ht 5\' 6"  (1.676 m)   Wt 91.2 kg (201 lb)   LMP 02/05/2017  SpO2 98%   BMI 32.44 kg/m   Physical Exam  Constitutional: She is oriented to person, place, and time. She appears well-developed and well-nourished.  HENT:  Head: Normocephalic.  Neck: Normal range of motion. Neck supple.  Cardiovascular: Normal rate and regular rhythm.  Pulmonary/Chest: Effort normal and breath sounds normal. She has no wheezes. She has no rales.  No chest wall tenderness.   Abdominal: Soft. Bowel sounds are normal. There is no tenderness. There is no rebound and no guarding.  Musculoskeletal: Normal range of motion.       Right lower leg: She exhibits no edema.  Neurological: She is alert and oriented to person, place, and time.  Skin: Skin is warm and dry. No rash noted.  Psychiatric: She has a normal mood and affect.     ED Treatments / Results  Labs (all labs ordered are listed, but only abnormal results are displayed) Labs Reviewed  GLUCOSE, CAPILLARY - Abnormal; Notable for the following components:      Result Value   Glucose-Capillary 169 (*)    All other components within normal limits  BASIC METABOLIC PANEL - Abnormal; Notable for the following components:   Glucose, Bld 156 (*)    BUN 5 (*)    All other components within normal limits  CBC - Abnormal; Notable for the following components:   WBC 11.2 (*)    All other components within normal limits  I-STAT TROPONIN, ED  I-STAT BETA HCG BLOOD, ED ([facility], [facility], [facility] ONLY)    EKG  EKG Interpretation  Date/Time:  Monday February 19 2017 23:32:07 EST Ventricular Rate:  107 PR  Interval:  182 QRS Duration: 78 QT Interval:  352 QTC Calculation: 469 R Axis:   67 Text Interpretation:  Sinus tachycardia Otherwise normal ECG Confirmed by Pryor Curia [phone]) on 02/20/2017 5:13:43 AM       Radiology Dg Chest 2 View  Result Date: 02/20/2017 CLINICAL DATA:  Substernal chest pain started after receiving nighttime medications. Pain worse with palpation and movement. No recent injury. EXAM: CHEST  2 VIEW COMPARISON:  02/16/2017 FINDINGS: The heart size and mediastinal contours are within normal limits. Both lungs are clear. The visualized skeletal structures are unremarkable. IMPRESSION: No active cardiopulmonary disease. Electronically Signed   By: Lucienne Capers M.D.   On: 02/20/2017 00:09    Procedures Procedures (including critical care time)  Medications Ordered in ED Medications  acetaminophen (TYLENOL) tablet 650 mg (not administered)  alum & mag hydroxide-simeth (MAALOX/MYLANTA) 200-200-20 MG/5ML suspension 30 mL (not administered)  hydrOXYzine (ATARAX/VISTARIL) tablet 25 mg (25 mg Oral Given 02/19/17 2151)  magnesium hydroxide (MILK OF MAGNESIA) suspension 30 mL (not administered)  traZODone (DESYREL) tablet 50 mg (50 mg Oral Given 02/19/17 2151)  ibuprofen (ADVIL,MOTRIN) tablet 600 mg (not administered)  aspirin 81 MG chewable tablet (324 mg  Given 02/19/17 2300)     Initial Impression / Assessment and Plan / ED Course  I have reviewed the triage vital signs and the nursing notes.  Pertinent labs & imaging results that were available during my care of the patient were reviewed by me and considered in my medical decision making (see chart for details).     Patient with chest pain that is constant and sharp, left sided, w/o SOB. She reports pleuritic chest pain.  She has no risk factors for PE but presents tachycardic with pleuritic component. She is in NAD, no hypoxia. Labs, including troponin are negative.   D-dimer added and is negative. The  patient  has been adequately evaluated for chest pain that is unlikely to be ACS, PE, infection. She is cleared to return to [facility]  Final Clinical Impressions(s) / ED Diagnoses   Final diagnoses:  None   1. Nonspecific chest pain.  ED Discharge Orders    None       Charlann Lange, Hershal Coria 02/20/17 [place], Delice Bison, DO 02/20/17 [phone]

## 2017-02-20 NOTE — BHH Group Notes (Signed)
[facility] Group Notes:  (Nursing/MHT/Case Management/Adjunct)  Date:  02/20/2017  Time:  5:26 PM  Type of Therapy:  Psychoeducational Skills  Participation Level:  Active  Participation Quality:  Appropriate  Affect:  Appropriate  Cognitive:  Appropriate  Insight:  Appropriate  Engagement in Group:  Supportive  Modes of Intervention:  Education  Summary of Progress/Problems:  Patient attended mindful guided [place] group.  Cammy Copa 02/20/2017, 5:26 PM

## 2017-02-20 NOTE — Progress Notes (Signed)
Recreation Therapy Notes  Date: 1.29.19 Time: 2:45 pm  Location: [address]   AAA/T Program Assumption of Risk Form signed by Patient/ or Parent Legal Guardian Yes  Patient is free of allergies or sever asthma Yes  Patient reports no fear of animals Yes  Patient reports no history of cruelty to animals Yes  Patient understands his/her participation is voluntary Yes  Patient washes hands before animal contact Yes  Patient washes hands after animal contact Yes  Behavioral Response: Engaged   Education:Hand Washing, Appropriate Animal Interaction   Education Outcome: Acknowledges education.   Clinical Observations/Feedback: Patient attended session and interacted appropriately with therapy dog and peers. Patient asked appropriate questions about therapy dog and his training. Patient shared stories about their pets at home with group.   Ranell Patrick, Recreation Therapy Intern   Ranell Patrick 02/20/2017 3:41 PM

## 2017-02-20 NOTE — BHH Group Notes (Signed)
LCSW Group Therapy Note 02/20/2017 3:56 PM  Type of Therapy/Topic: Group Therapy: Feelings about Diagnosis  Participation Level: Did Not Attend   Description of Group:  This group will allow patients to explore their thoughts and feelings about diagnoses they have received. Patients will be guided to explore their level of understanding and acceptance of these diagnoses. Facilitator will encourage patients to process their thoughts and feelings about the reactions of others to their diagnosis and will guide patients in identifying ways to discuss their diagnosis with significant others in their lives. This group will be process-oriented, with patients participating in exploration of their own experiences, giving and receiving support, and processing challenge from other group members.  Therapeutic Goals: 1. Patient will demonstrate understanding of diagnosis as evidenced by identifying two or more symptoms of the disorder 2. Patient will be able to express two feelings regarding the diagnosis 3. Patient will demonstrate their ability to communicate their needs through discussion and/or role play  Summary of Patient Progress:  Invited, chose not to attend.      Therapeutic Modalities:  Cognitive Behavioral Therapy Brief Therapy Feelings Identification    [place] Clinical Social Worker

## 2017-02-20 NOTE — BHH Group Notes (Addendum)
[facility] Group Notes:  (Nursing/MHT/Case Management/Adjunct)  Date:  02/20/2017  Time:  5:27 PM  Type of Therapy:  Psychoeducational Skills  Participation Level:  Active  Participation Quality:  Appropriate and Resistant  Affect:  Appropriate  Cognitive:  Alert  Insight:  Appropriate  Engagement in Group:  Engaged  Modes of Intervention:  Discussion and Education  Summary of Progress/Problems:  Pt Participated in group. During this group staff discussed recovery and how to prevent relapse. Pt's were given a set back prevention plan worksheet to complete to help them through recovery. Pt was resistant to share during group, but became open to sharing after listening to her peers speak.   Lita Mains 02/20/2017, 5:27 PM

## 2017-02-20 NOTE — Progress Notes (Signed)
Patient ID: FAATIMA TENCH, female   DOB: July 24, 1970, 47 y.o.   MRN: [id number] D: Pt endorses +SI, is tearful as she talks about her home life, and about her husband not being caring for her.  Pt reports that she has thoughts of dying all the time, and denies having a current plan. Pt verbally contracts for safety on the unit, agrees to let staff know if these thoughts worsen. Pt rates her depression as 10 (10 being the worst), rates her hopelessness level as 7 (10 being the worst), and rates her anxiety level as 10 (10 being the worst).  Pt reports that she recently changed providers, states she stopped seeing Dr Erling Cruz in [facility] because he stopped prescribing Ativan for her.  A: Q15 minute safety checks in place for safety, all medications being given as scheduled.  Pt complained of a headache of 7/10 at 957am, and was medicated with Ibuprofen 600mg .  Pt also complained of feeling anxious at that time, and was medicated with Atarax 25mg . Empathy provided as well.  R: Pt continues to be anxious and tearful, will continue to monitor.
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
## 2017-02-20 NOTE — Progress Notes (Signed)
Pt did not attend orientation group.  

## 2017-02-20 NOTE — [facility] Group Notes (Signed)
[facility] Group Notes:  (Nursing/MHT/Case Management/Adjunct)  Date:  02/20/2017  Time:  4:58 PM  Type of Therapy:  Psychoeducational Skills  Participation Level:  Active  Participation Quality:  Appropriate  Affect:  Appropriate  Cognitive:  Appropriate  Insight:  Appropriate  Engagement in Group:  Supportive  Modes of Intervention:  Education  Summary of Progress/Problems:  Patient attended relaxation group.  Guided through mindful breathing.  Cammy Copa 02/20/2017, 4:58 PM
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
## 2017-02-20 NOTE — Discharge Instructions (Addendum)
Your abdominal CT scan and lab work do not show a reason for your abdominal pain.  You can have whatever your doctors want you to have for nausea.  They can also try Bentyl 10 mg with meals 4 times a day to see if that will help with your abdominal discomfort.  If you continue to have abdominal pain consider seeing a gastroenterologist.  Consider increasing the clonidine to 0.2 mg tablets 3 times a day and increase the metoprolol to 50 mg.  Your doctors can consult the hospitalist for suggestions of treatment for your hypertension if it isn't controlling your blood pressure.

## 2017-02-20 NOTE — [facility] Suicide Risk Assessment (Signed)
[place] [facility] INPATIENT:  Family/Significant Other Suicide Prevention Education  Suicide Prevention Education:  Patient Refusal for Family/Significant Other Suicide Prevention Education: The patient Meredith Cherry has refused to provide written consent for family/significant other to be provided Family/Significant Other Suicide Prevention Education during admission and/or prior to discharge.  Physician notified.  SPE completed with patient, as patient refused to consent to family contact. SPI pamphlet provided to pt and pt was encouraged to share information with support network, ask questions, and talk about any concerns relating to SPE. Patient denies access to guns/firearms and verbalized understanding of information provided. Mobile Crisis information also provided to patient.   Marylee Floras 02/20/2017, 3:11 PM

## 2017-02-21 ENCOUNTER — Encounter (HOSPITAL_COMMUNITY): Payer: Self-pay | Admitting: Emergency Medicine

## 2017-02-21 ENCOUNTER — Encounter (HOSPITAL_COMMUNITY): Payer: Self-pay

## 2017-02-21 ENCOUNTER — Other Ambulatory Visit: Payer: Self-pay

## 2017-02-21 ENCOUNTER — Emergency Department (HOSPITAL_COMMUNITY): Admission: EM | Admit: 2017-02-21 | Discharge: 2017-02-22 | Payer: Medicare PPO

## 2017-02-21 ENCOUNTER — Inpatient Hospital Stay (HOSPITAL_COMMUNITY): Payer: Medicare PPO

## 2017-02-21 DIAGNOSIS — G4700 Insomnia, unspecified: Secondary | ICD-10-CM

## 2017-02-21 DIAGNOSIS — F419 Anxiety disorder, unspecified: Secondary | ICD-10-CM

## 2017-02-21 DIAGNOSIS — R440 Auditory hallucinations: Secondary | ICD-10-CM

## 2017-02-21 DIAGNOSIS — Z87891 Personal history of nicotine dependence: Secondary | ICD-10-CM

## 2017-02-21 DIAGNOSIS — R450 Nervousness: Secondary | ICD-10-CM

## 2017-02-21 DIAGNOSIS — R441 Visual hallucinations: Secondary | ICD-10-CM

## 2017-02-21 DIAGNOSIS — M549 Dorsalgia, unspecified: Secondary | ICD-10-CM

## 2017-02-21 DIAGNOSIS — F39 Unspecified mood [affective] disorder: Secondary | ICD-10-CM

## 2017-02-21 DIAGNOSIS — Z560 Unemployment, unspecified: Secondary | ICD-10-CM

## 2017-02-21 LAB — CBC WITH DIFFERENTIAL/PLATELET
Basophils Absolute: 0 10*3/uL (ref 0.0–0.1)
Basophils Relative: 0 %
Eosinophils Absolute: 0.1 10*3/uL (ref 0.0–0.7)
Eosinophils Relative: 1 %
HCT: 35.2 % — ABNORMAL LOW (ref 36.0–46.0)
HEMOGLOBIN: 11.1 g/dL — AB (ref 12.0–15.0)
LYMPHS ABS: 3.1 10*3/uL (ref 0.7–4.0)
LYMPHS PCT: 22 %
MCH: 28.2 pg (ref 26.0–34.0)
MCHC: 31.5 g/dL (ref 30.0–36.0)
MCV: 89.6 fL (ref 78.0–100.0)
MONOS PCT: 7 %
Monocytes Absolute: 1 10*3/uL (ref 0.1–1.0)
NEUTROS PCT: 70 %
Neutro Abs: 10.2 10*3/uL — ABNORMAL HIGH (ref 1.7–7.7)
Platelets: 342 10*3/uL (ref 150–400)
RBC: 3.93 MIL/uL (ref 3.87–5.11)
RDW: 14.7 % (ref 11.5–15.5)
WBC: 14.5 10*3/uL — AB (ref 4.0–10.5)

## 2017-02-21 LAB — CBC
HEMATOCRIT: 40.3 % (ref 36.0–46.0)
Hemoglobin: 12.9 g/dL (ref 12.0–15.0)
MCH: 28.5 pg (ref 26.0–34.0)
MCHC: 32 g/dL (ref 30.0–36.0)
MCV: 89 fL (ref 78.0–100.0)
Platelets: 476 10*3/uL — ABNORMAL HIGH (ref 150–400)
RBC: 4.53 MIL/uL (ref 3.87–5.11)
RDW: 14.6 % (ref 11.5–15.5)
WBC: 16 10*3/uL — ABNORMAL HIGH (ref 4.0–10.5)

## 2017-02-21 LAB — GLUCOSE, CAPILLARY: Glucose-Capillary: 142 mg/dL — ABNORMAL HIGH (ref 65–99)

## 2017-02-21 LAB — COMPREHENSIVE METABOLIC PANEL
ALBUMIN: 4.7 g/dL (ref 3.5–5.0)
ALK PHOS: 95 U/L (ref 38–126)
ALT: 24 U/L (ref 14–54)
AST: 28 U/L (ref 15–41)
Anion gap: 11 (ref 5–15)
BUN: 10 mg/dL (ref 6–20)
CALCIUM: 9.5 mg/dL (ref 8.9–10.3)
CO2: 26 mmol/L (ref 22–32)
CREATININE: 0.75 mg/dL (ref 0.44–1.00)
Chloride: 103 mmol/L (ref 101–111)
GFR calc Af Amer: >60 mL/min (ref >60)
GFR calc non Af Amer: >60 mL/min (ref >60)
GLUCOSE: 139 mg/dL — AB (ref 65–99)
Potassium: 3.9 mmol/L (ref 3.5–5.1)
Sodium: 140 mmol/L (ref 135–145)
TOTAL PROTEIN: 8.6 g/dL — AB (ref 6.5–8.1)

## 2017-02-21 LAB — BASIC METABOLIC PANEL
Anion gap: 15 (ref 5–15)
BUN: 7 mg/dL (ref 6–20)
CHLORIDE: 103 mmol/L (ref 101–111)
CO2: 19 mmol/L — AB (ref 22–32)
Calcium: 8.7 mg/dL — ABNORMAL LOW (ref 8.9–10.3)
Creatinine, Ser: 0.75 mg/dL (ref 0.44–1.00)
GFR calc non Af Amer: >60 mL/min (ref >60)
Glucose, Bld: 127 mg/dL — ABNORMAL HIGH (ref 65–99)
POTASSIUM: 3.8 mmol/L (ref 3.5–5.1)
SODIUM: 137 mmol/L (ref 135–145)

## 2017-02-21 LAB — LIPASE, BLOOD: Lipase: 25 U/L (ref 11–51)

## 2017-02-21 LAB — URINE CULTURE: Special Requests: NORMAL

## 2017-02-21 LAB — I-STAT BETA HCG BLOOD, ED (MC, WL, AP ONLY): I-stat hCG, quantitative: <5 m[IU]/mL (ref <5)

## 2017-02-21 LAB — TROPONIN I: Troponin I: <0.03 ng/mL (ref <0.03)

## 2017-02-21 MED ORDER — CLONIDINE HCL 0.1 MG PO TABS
0.1000 mg | ORAL_TABLET | Freq: Three times a day (TID) | ORAL | Status: DC | PRN
  Administered 2017-02-21 (×2): 0.1 mg via ORAL
  Filled 2017-02-21 (×2): qty 1

## 2017-02-21 MED ORDER — CLONIDINE HCL 0.1 MG PO TABS: 0.1000 mg | ORAL_TABLET | Freq: Three times a day (TID) | ORAL | Status: DC

## 2017-02-21 MED ORDER — ONDANSETRON HCL 4 MG/2ML IJ SOLN
INTRAMUSCULAR | Status: AC
  Filled 2017-02-21: qty 2

## 2017-02-21 MED ORDER — ONDANSETRON HCL 4 MG/2ML IJ SOLN
4.0000 mg | Freq: Once | INTRAMUSCULAR | Status: AC
  Administered 2017-02-21: 4 mg via INTRAMUSCULAR

## 2017-02-21 MED ORDER — LISINOPRIL 10 MG PO TABS
10.0000 mg | ORAL_TABLET | Freq: Every day | ORAL | Status: DC
  Filled 2017-02-21 (×2): qty 1

## 2017-02-21 MED ORDER — ACETAMINOPHEN 325 MG PO TABS
650.0000 mg | ORAL_TABLET | Freq: Once | ORAL | Status: AC
  Administered 2017-02-21: 650 mg via ORAL
  Filled 2017-02-21: qty 2

## 2017-02-21 NOTE — Progress Notes (Signed)
Pt is at the ED being assessed for N/V episodes and extremely high blood pressure that has continued for the last three days.  Pt has been in the ED each evening since her admission to [facility].  Pt continues on 1:1 for safety.  [facility] sitter with pt.

## 2017-02-21 NOTE — Progress Notes (Signed)
At approximately 1720 pt was observed vomiting in the restroom. Pt complained of an ongoing headache 10/10 "pounding" throughout the day. Pt last b/p 202/129. Writer notified Dr. Sanjuana Letters. Per MD, give pt 4 mg of Zofran IM and Clonidine 0.1 mg PO stat. Pt b/p elevated continuously for the last couple of days. Per MD, send pt to the ED for eval of uncontrolled hypertension. Pt seen in ED on 1/29 after an unwitnessed fall per pt related to HTN. Pt seen in the ED on 1/28 due to chest pain.

## 2017-02-21 NOTE — ED Triage Notes (Addendum)
Per EMS pt from [facility].  Unwitnessed fall. Pt states she was washing her hands and "blacked out".  Found on floor.  Complaints of head and neck pain. No obvious injury. Pt in [place] per EMS d/t not cooperating with their neuro exam.  Pt has history of hypertension and was hypertensive on scene at 232/110.  Pt is IVCd d/t SI. Seen here last night for chest pain and returned to [facility]

## 2017-02-21 NOTE — Progress Notes (Signed)
Recreation Therapy Notes  Date: 02/21/17 Time: 0930 Location: 300 Hall Dayroom  Group Topic: Stress Management  Goal Area(s) Addresses:  Patient will verbalize importance of using healthy stress management.  Patient will identify positive emotions associated with healthy stress management.   Intervention: Stress Management  Activity :  Guided Imagery.  LRT introduced the stress management technique of guided imagery.  LRT read a script that allowed patients to take a stroll on the beach.  Patients were to follow along as LRT read script to engage in activity.  Education:  Stress Management, Discharge Planning.   Education Outcome: Acknowledges edcuation/In group clarification offered/Needs additional education  Clinical Observations/Feedback: Pt did not attend group.    Victorino Sparrow, LRT/CTRS         Victorino Sparrow A 02/21/2017 11:41 AM

## 2017-02-21 NOTE — ED Provider Notes (Signed)
[facility] EMERGENCY DEPARTMENT Provider Note   CSN: [id number] Arrival date & time: 02/19/17  2306     History   Chief Complaint Chief Complaint  Patient presents with  . Fall    HPI Meredith Cherry is a 47 y.o. female.  The history is provided by the patient.  Fall  This is a new problem. Episode onset: Just prior to arrival at behavioral health Hospital. The problem occurs constantly. The problem has not changed since onset.Associated symptoms include headaches. Pertinent negatives include no chest pain, no abdominal pain and no shortness of breath. Nothing aggravates the symptoms. Nothing relieves the symptoms.  Patient presents from behavioral health Hospital after brief syncopal event sustaining an injury to her head and neck. Patient reports she was washing her hands when she "blacked out" and was found on the floor.  She woke up soon thereafter, no seizures were reported.  She reports pain in the back of her head and her upper neck.  She denies any new chest pain or shortness of breath, no new abdominal pain.  Denies any extremity pain or trauma.  She just feels generalized weakness Past Medical History:  Diagnosis Date  . ADHD (attention deficit hyperactivity disorder)   . Allergic rhinitis   . Anxiety   . Asthma   . Bipolar 1 disorder ([place])   . Chiari malformation type I ([place])   . Chronic migraine   . Depression   . Diabetes mellitus without complication ([place])   . GERD (gastroesophageal reflux disease)   . Hypertension   . Panic attack   . Psychiatric disorder     Patient Active Problem List   Diagnosis Date Noted  . Bipolar affect, depressed ([place]) 02/19/2017  . History of Chiari malformation 11/15/2015  . 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 Prolonged QT interval 11/15/2015  . Panic attack   . Hypertension   . GERD (gastroesophageal reflux disease)   . Depression   . Anxiety   . ADHD (attention deficit hyperactivity disorder)   .  Acute encephalopathy   . 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 Snoring 12/07/2011    Past Surgical History:  Procedure Laterality Date  . CHOLECYSTECTOMY    . TUBAL LIGATION      OB History    Gravida Para Term Preterm AB Living   2 2 2     2    SAB TAB Ectopic Multiple Live Births                   Home Medications    Prior to Admission medications   Medication Sig Start Date End Date Taking? Authorizing Provider  albuterol (PROVENTIL) (2.5 MG/3ML) 0.083% nebulizer solution Take 2.5 mg by nebulization every 6 (six) hours as needed for wheezing or shortness of breath.    Yes [provider]  aspirin EC 81 MG EC tablet Take 1 tablet (81 mg total) by mouth daily. 11/18/15  Yes Sheikh, Omair Latif, DO  clonazePAM (KLONOPIN) 0.5 MG tablet Take 1 tablet (0.5 mg total) by mouth 3 (three) times daily. 11/17/15  Yes Sheikh, Omair Latif, DO  cloNIDine (CATAPRES) 0.1 MG tablet Take 0.1 mg by mouth 2 (two) times daily.   Yes [provider]  diltiazem (CARDIZEM CD) 240 MG 24 hr capsule Take 240 mg by mouth daily.   Yes [provider]  divalproex (DEPAKOTE) 500 MG DR tablet Take 1 tablet (500 mg total) by mouth every 12 (twelve) hours. 11/17/15  Yes Raiford Noble Cheraw,  DO  EPINEPHrine 0.15 MG/0.15ML IJ injection Inject 0.15 mg into the muscle as needed for anaphylaxis.    Yes [provider]  ibuprofen (ADVIL,MOTRIN) 800 MG tablet Take 800 mg by mouth 3 (three) times daily.   Yes [provider]  losartan (COZAAR) 100 MG tablet Take 100 mg by mouth daily. 10/24/15  Yes [provider]  metFORMIN (GLUCOPHAGE) 1000 MG tablet Take 1,000 mg by mouth 2 (two) times daily.   Yes [provider]  nitroGLYCERIN (NITROSTAT) 0.4 MG SL tablet Place 0.4 mg under the tongue every 5 (five) minutes as needed for chest pain.    Yes [provider]  omeprazole (PRILOSEC) 20 MG capsule Take 20 mg by mouth daily. 11/01/15  Yes [provider]    pravastatin (PRAVACHOL) 20 MG tablet Take 20 mg by mouth daily.  09/27/15  Yes [provider]  QUEtiapine (SEROQUEL) 300 MG tablet Take 300 mg by mouth at bedtime.   Yes [provider]  ranitidine (ZANTAC) 150 MG tablet Take 150 mg by mouth at bedtime. 08/23/15  Yes [provider]  SUMAtriptan (IMITREX) 100 MG tablet Take 100 mg by mouth every 2 (two) hours as needed for migraine.  08/23/15  Yes [provider]  tiZANidine (ZANAFLEX) 2 MG tablet Take 2 mg by mouth every 6 (six) hours as needed for muscle spasms.   Yes [provider]  topiramate (TOPAMAX) 100 MG tablet Take 100 mg by mouth daily as needed (headache).    Yes [provider]  traMADol (ULTRAM) 50 MG tablet Take 50 mg by mouth every 6 (six) hours as needed for moderate pain.   Yes [provider]  traZODone (DESYREL) 50 MG tablet Take 50 mg by mouth at bedtime.   Yes [provider]  gabapentin (NEURONTIN) 400 MG capsule Take 1 capsule (400 mg total) by mouth 3 (three) times daily. Patient not taking: Reported on 02/20/2017 11/17/15   Raiford Noble Latif, DO  prazosin (MINIPRESS) 2 MG capsule Take 1 capsule (2 mg total) by mouth at bedtime. Patient not taking: Reported on 02/20/2017 11/17/15   Kerney Elbe, DO    Family History Family History  Problem Relation Age of Onset  . Stroke Mother        x 5  . Hypertension Mother   . Heart disease Mother   . Diabetes Mother   . Diabetes Father   . Heart disease Maternal Grandmother   . Breast cancer Paternal Grandmother   . Asthma Brother     Social History Social History   Tobacco Use  . Smoking status: Former Smoker    Types: Cigarettes  . Smokeless tobacco: Never Used  Substance Use Topics  . Alcohol use: No  . Drug use: No     Allergies   Bactrim [sulfamethoxazole-trimethoprim]; Ciprofloxacin; Penicillins; and Sulfa antibiotics   Review of Systems Review of Systems  Constitutional:  Negative for fever.  Respiratory: Negative for shortness of breath.   Cardiovascular: Negative for chest pain.  Gastrointestinal: Negative for abdominal pain.  Neurological: Positive for syncope and headaches. Negative for seizures.  All other systems reviewed and are negative.    Physical Exam Updated Vital Signs BP (!) 180/114 (BP Location: Left Arm)   Pulse (!) 101   Temp 98.3 F (36.8 C) (Oral)   Resp 13   Ht 1.676 m (5\' 6" )   Wt 91.2 kg (201 lb)   LMP 02/05/2017   SpO2 100%   BMI  32.44 kg/m   Physical Exam CONSTITUTIONAL: Chronically ill-appearing/anxious HEAD: Tenderness to posterior scalp, no lacerations noted mild soft tissue swelling noted, no crepitus. EYES: EOMI/PERRL ENMT: Mucous membranes moist, no signs of facial trauma or tongue laceration NECK: Cervical collar in place SPINE/BACK: Mild tenderness in cervical spine, no thoracic or lumbar tenderness, no bruising/crepitance/stepoffs noted to spine CV: S1/S2 noted, no murmurs/rubs/gallops noted LUNGS: Lungs are clear to auscultation bilaterally, no apparent distress ABDOMEN: soft, nontender GU:no cva tenderness NEURO: Pt is awake/alert/appropriate, moves all extremitiesx4.  No facial droop.  No arm or leg drift EXTREMITIES: pulses normal/equal, full ROM, all other extremities/joints palpated/ranged and nontender SKIN: warm, color normal PSYCH: Anxious  ED Treatments / Results  Labs (all labs ordered are listed, but only abnormal results are displayed) Labs Reviewed  GLUCOSE, CAPILLARY - Abnormal; Notable for the following components:      Result Value   Glucose-Capillary 169 (*)    All other components within normal limits  BASIC METABOLIC PANEL - Abnormal; Notable for the following components:   Glucose, Bld 156 (*)    BUN 5 (*)    All other components within normal limits  CBC - Abnormal; Notable for the following components:   WBC 11.2 (*)    All other components within normal limits  GLUCOSE,  CAPILLARY - Abnormal; Notable for the following components:   Glucose-Capillary 142 (*)    All other components within normal limits  D-DIMER, QUANTITATIVE (NOT AT [facility])  BASIC METABOLIC PANEL  CBC WITH DIFFERENTIAL/PLATELET  TROPONIN I  I-STAT TROPONIN, ED  I-STAT BETA HCG BLOOD, ED ([facility], [facility], [facility] ONLY)    EKG  EKG Interpretation  Date/Time:  Wednesday February 21 2017 01:20:12 EST Ventricular Rate:  102 PR Interval:  182 QRS Duration: 83 QT Interval:  388 QTC Calculation: 506 R Axis:   71 Text Interpretation:  Sinus tachycardia Abnormal R-wave progression, early transition No significant change since last tracing Confirmed by Ripley Fraise [phone]) on 02/21/2017 1:36:50 AM       Radiology Dg Chest 2 View  Result Date: 02/20/2017 CLINICAL DATA:  Substernal chest pain started after receiving nighttime medications. Pain worse with palpation and movement. No recent injury. EXAM: CHEST  2 VIEW COMPARISON:  02/16/2017 FINDINGS: The heart size and mediastinal contours are within normal limits. Both lungs are clear. The visualized skeletal structures are unremarkable. IMPRESSION: No active cardiopulmonary disease. Electronically Signed   By: Lucienne Capers M.D.   On: 02/20/2017 00:09   Ct Head Wo Contrast  Result Date: 02/21/2017 CLINICAL DATA:  47 y/o  F; unwitnessed fall with head injury. EXAM: CT HEAD WITHOUT CONTRAST CT CERVICAL SPINE WITHOUT CONTRAST TECHNIQUE: Multidetector CT imaging of the head and cervical spine was performed following the standard protocol without intravenous contrast. Multiplanar CT image reconstructions of the cervical spine were also generated. COMPARISON:  07/08/2016 CT head.  11/27/2015 CT cervical spine. FINDINGS: CT HEAD FINDINGS Brain: No evidence of acute infarction, hemorrhage, hydrocephalus, extra-axial collection or mass lesion/mass effect. Stable findings of Chiari malformation post suboccipital decompression. Vascular: No hyperdense vessel or  unexpected calcification. Skull: Posterior arc of C1 and suboccipital resection stable chronic postsurgical changes. No focal calvarial lesion. Sinuses/Orbits: No acute finding. Other: None. CT CERVICAL SPINE FINDINGS Alignment: Normal. Skull base and vertebrae: No acute fracture. No primary bone lesion or focal pathologic process. Soft tissues and spinal canal: No prevertebral fluid or swelling. No visible canal hematoma. Disc levels: Vertebral body and disc space heights are preserved. Minimal facet arthrosis.  No significant bony foraminal or canal stenosis. Upper chest: Negative. Other: Negative. IMPRESSION: 1. No acute intracranial abnormality or calvarial fracture. 2. No acute fracture or dislocation of cervical spine. 3. 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 Electronically Signed   By: Kristine Garbe M.D.   On: 02/21/2017 02:57   Ct Cervical Spine Wo Contrast  Result Date: 02/21/2017 CLINICAL DATA:  47 y/o  F; unwitnessed fall with head injury. EXAM: CT HEAD WITHOUT CONTRAST CT CERVICAL SPINE WITHOUT CONTRAST TECHNIQUE: Multidetector CT imaging of the head and cervical spine was performed following the standard protocol without intravenous contrast. Multiplanar CT image reconstructions of the cervical spine were also generated. COMPARISON:  07/08/2016 CT head.  11/27/2015 CT cervical spine. FINDINGS: CT HEAD FINDINGS Brain: No evidence of acute infarction, hemorrhage, hydrocephalus, extra-axial collection or mass lesion/mass effect. Stable findings of Chiari malformation post suboccipital decompression. Vascular: No hyperdense vessel or unexpected calcification. Skull: Posterior arc of C1 and suboccipital resection stable chronic postsurgical changes. No focal calvarial lesion. Sinuses/Orbits: No acute finding. Other: None. CT CERVICAL SPINE FINDINGS Alignment: Normal. Skull base and vertebrae: No acute fracture. No primary bone lesion or focal  pathologic process. Soft tissues and spinal canal: No prevertebral fluid or swelling. No visible canal hematoma. Disc levels: Vertebral body and disc space heights are preserved. Minimal facet arthrosis. No significant bony foraminal or canal stenosis. Upper chest: Negative. Other: Negative. IMPRESSION: 1. No acute intracranial abnormality or calvarial fracture. 2. No acute fracture or dislocation of cervical spine. 3. 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 Electronically Signed   By: Kristine Garbe M.D.   On: 02/21/2017 02:57    Procedures Procedures (including critical care time)  Medications Ordered in ED Medications  acetaminophen (TYLENOL) tablet 650 mg (650 mg Oral Given 02/20/17 1702)  alum & mag hydroxide-simeth (MAALOX/MYLANTA) 200-200-20 MG/5ML suspension 30 mL (30 mLs Oral Given 02/20/17 1856)  hydrOXYzine (ATARAX/VISTARIL) tablet 25 mg (25 mg Oral Given 02/20/17 2327)  magnesium hydroxide (MILK OF MAGNESIA) suspension 30 mL (not administered)  traZODone (DESYREL) tablet 50 mg (50 mg Oral Given 02/20/17 2326)  ibuprofen (ADVIL,MOTRIN) tablet 600 mg (600 mg Oral Given 02/20/17 2327)  nitrofurantoin (macrocrystal-monohydrate) (MACROBID) capsule 100 mg (100 mg Oral Given 02/20/17 2206)  ARIPiprazole (ABILIFY) tablet 5 mg (5 mg Oral Given 02/20/17 1659)  mirtazapine (REMERON) tablet 15 mg (15 mg Oral Given 02/20/17 2207)  prazosin (MINIPRESS) capsule 4 mg (4 mg Oral Given 02/20/17 2207)  metFORMIN (GLUCOPHAGE) tablet 1,000 mg (1,000 mg Oral Given 02/20/17 1659)  metoprolol tartrate (LOPRESSOR) tablet 25 mg (25 mg Oral Given 02/20/17 2326)  aspirin 81 MG chewable tablet (324 mg  Given 02/19/17 2300)  acetaminophen (TYLENOL) tablet 650 mg (650 mg Oral Given 02/20/17 0604)     Initial Impression / Assessment and Plan / ED Course  I have reviewed the triage vital signs and the nursing notes.  Pertinent labs & imaging results that were  available during my care of the patient were reviewed by me and considered in my medical decision making (see chart for details).     2:17 AM Patient here in emergency department after a fall at behavioral health, possible syncopal episode She has already had an emergency department evaluation yesterday for chest pain, at that point no signs of ACS or PE On this visit, will obtain CT head and C-spine to rule out any trauma. We will recheck check her lab work as well Her EKG  reveals mild prolonged QT but not significantly changed from prior We will advised to avoid QT prolongation medication such as antipsychotics while at behavioral health 4:13 AM Imaging negative Patient appears improved, no distress, she was all extremities x4 without difficulty. I reviewed telemetry monitoring, no dysrhythmias noted while in the emergency department.   Discharge and transfer back to behavioral health for further psych evaluation.  I do feel it is important that she be taken off antipsychotics as they contribute to syncope as well as prolonged QT and this has been endorsed to nursing Final Clinical Impressions(s) / ED Diagnoses   Final diagnoses:  Nonspecific chest pain    ED Discharge Orders    None       Ripley Fraise, MD 02/21/17 [phone]

## 2017-02-21 NOTE — Progress Notes (Addendum)
MHT alerted RN that pt was found during the 15 minute check lying on the floor of her bathroom.  Fall was unobserved.   At first pt was unresponsive, but after RN loudly called her name, MHT remarked that pt was breathing, then she opened her eyes and said that she blacked out while washing her hands.  The sink basin was dry.  She was lying supine on the floor, complaining of her head hurting.  Pt's head was hurting before the fall.  She says she does not remember falling.  Pt's BP is extremely elevated at this time.  CBG was 142.  Pt had been given Lopressor 25 mg along with scheduled Minipress 4 mg prior to bedtime as her BP has been elevated all evening.  Pt had also been given Ibuprofen 600 mg at that time.  Pt is being sent to the ED for assessment due to pt complaint of head hurting per provider.    MHT observed that pt was straining and pumping her R fist while the BP was being checked.  She had her L arm relaxed at that time.

## 2017-02-21 NOTE — Progress Notes (Signed)
Observation note: Pt observed lying down in bed asleep with sitter present at bedside. Pt remains on 1:1 observation for safety.

## 2017-02-21 NOTE — Progress Notes (Signed)
1:1 Observation note: Pt lying in bed sleeping with sitter at bedside. Pt do not appear to be in distress. Resp are even and unlabored. Pt remains on 1:1 observation for safety.   Pt reported increased depression and anxiety this morning. Pt endorses AVH, seeing her dead father and hearing voices telling her to kill herself. Pt endorses SI. Pt verbally contracts for safety. Pt reports difficulty sleeping last night due to nightmares and racing thoughts. Pt refused Abilify this morning because she believes it was associated with her falling last night. Tx team made aware.

## 2017-02-21 NOTE — Progress Notes (Signed)
[facility] MD Progress Note  02/21/2017 12:50 PM Meredith Cherry  MRN:  [id number]   Subjective:  Patient reports that she is feeling very depressed and rates her depression at 10/10 and her anxiety at 7/10. She reports passive SI and states that she has been seeing images of her dad and then hearing a voice telling her to hurt herself. She has continued to have a headache and has taken ibuprofen as ordered. She refused to take the Abilify this morning as she feels the Abilify is causing her dizzy spells and headaches. .    Objective: Patient's chart and findings reviewed and discussed with treatment team. Patient agrees to continue all prescribed medications. Patient is encouraged to be compliant with treatment and medications to assist with improvement. Patient has shown significant Borderline traits as well. Continue current medication regimen.  Principal Problem: Bipolar affect, depressed ([place]) Diagnosis:   Patient Active Problem List   Diagnosis Date Noted  . Bipolar affect, depressed ([place]) [F31.30] 02/19/2017  . History of Chiari malformation [Z86.69] 11/15/2015  . 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 Prolonged QT interval [R94.31] 11/15/2015  . Panic attack [F41.0]   . Hypertension [I10]   . GERD (gastroesophageal reflux disease) [K21.9]   . Depression [F32.9]   . Anxiety [F41.9]   . ADHD (attention deficit hyperactivity disorder) [F90.9]   . Acute encephalopathy [G93.40]   . 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 Snoring [R06.83] 12/07/2011   Total Time spent with patient: 15 minutes  Past Psychiatric History: See H&P  Past Medical History:  Past Medical History:  Diagnosis Date  . ADHD (attention deficit hyperactivity disorder)   . Allergic rhinitis   . Anxiety   . Asthma   . Bipolar 1 disorder ([place])   . Chiari malformation type I ([place])   . Chronic migraine   . Depression   . Diabetes mellitus without complication ([place])   . GERD (gastroesophageal reflux disease)    . Hypertension   . Panic attack   . Psychiatric disorder     Past Surgical History:  Procedure Laterality Date  . CHOLECYSTECTOMY    . TUBAL LIGATION     Family History:  Family History  Problem Relation Age of Onset  . Stroke Mother        x 5  . Hypertension Mother   . Heart disease Mother   . Diabetes Mother   . Diabetes Father   . Heart disease Maternal Grandmother   . Breast cancer Paternal Grandmother   . Asthma Brother    Family Psychiatric  History: See H&P Social History:  Social History   Substance and Sexual Activity  Alcohol Use No     Social History   Substance and Sexual Activity  Drug Use No    Social History   Socioeconomic History  . Marital status: Married    Spouse name: None  . Number of children: 2  . Years of education: None  . Highest education level: None  Social Needs  . Financial resource strain: None  . Food insecurity - worry: None  . Food insecurity - inability: None  . Transportation needs - medical: None  . Transportation needs - non-medical: None  Occupational History  . Occupation: unemployed  Tobacco Use  . Smoking status: Former Smoker    Types: Cigarettes  . Smokeless tobacco: Never Used  Substance and Sexual Activity  . Alcohol use: No  . Drug use: No  . Sexual activity: None  Other Topics Concern  .  None  Social History Narrative  . None   Additional Social History:                         Sleep: Good  Appetite:  Good  Current Medications: Current Facility-Administered Medications  Medication Dose Route Frequency Provider Last Rate Last Dose  . acetaminophen (TYLENOL) tablet 650 mg  650 mg Oral Q6H PRN Niel Hummer, NP   650 mg at 02/21/17 1132  . alum & mag hydroxide-simeth (MAALOX/MYLANTA) 200-200-20 MG/5ML suspension 30 mL  30 mL Oral Q4H PRN Elmarie Shiley A, NP   30 mL at 02/20/17 1856  . ARIPiprazole (ABILIFY) tablet 5 mg  5 mg Oral Daily Izediuno, Vincent A, MD   5 mg at 02/21/17 1132   . hydrOXYzine (ATARAX/VISTARIL) tablet 25 mg  25 mg Oral Q6H PRN Niel Hummer, NP   25 mg at 02/20/17 2327  . ibuprofen (ADVIL,MOTRIN) tablet 600 mg  600 mg Oral Q6H PRN Laverle Hobby, PA-C   600 mg at 02/21/17 0805  . magnesium hydroxide (MILK OF MAGNESIA) suspension 30 mL  30 mL Oral Daily PRN Elmarie Shiley A, NP      . metFORMIN (GLUCOPHAGE) tablet 1,000 mg  1,000 mg Oral BID WC Izediuno, Laruth Bouchard, MD   1,000 mg at 02/21/17 0805  . metoprolol tartrate (LOPRESSOR) tablet 25 mg  25 mg Oral BID Laverle Hobby, PA-C   25 mg at 02/21/17 0805  . mirtazapine (REMERON) tablet 15 mg  15 mg Oral QHS Izediuno, Laruth Bouchard, MD   15 mg at 02/20/17 2207  . nitrofurantoin (macrocrystal-monohydrate) (MACROBID) capsule 100 mg  100 mg Oral Q12H Izediuno, Vincent A, MD   100 mg at 02/21/17 0805  . prazosin (MINIPRESS) capsule 4 mg  4 mg Oral QHS Izediuno, Laruth Bouchard, MD   4 mg at 02/20/17 2207  . traZODone (DESYREL) tablet 50 mg  50 mg Oral QHS PRN Niel Hummer, NP   50 mg at 02/20/17 2326    Lab Results:  Results for orders placed or performed during the hospital encounter of 02/19/17 (from the past 48 hour(s))  Glucose, capillary     Status: Abnormal   Collection Time: 02/19/17 10:43 PM  Result Value Ref Range   Glucose-Capillary 169 (H) 65 - 99 mg/dL  Basic metabolic panel     Status: Abnormal   Collection Time: 02/19/17 11:46 PM  Result Value Ref Range   Sodium 139 135 - 145 mmol/L   Potassium 3.8 3.5 - 5.1 mmol/L   Chloride 104 101 - 111 mmol/L   CO2 22 22 - 32 mmol/L   Glucose, Bld 156 (H) 65 - 99 mg/dL   BUN 5 (L) 6 - 20 mg/dL   Creatinine, Ser 0.88 0.44 - 1.00 mg/dL   Calcium 8.9 8.9 - 10.3 mg/dL   GFR calc non Af Amer >60 >60 mL/min   GFR calc Af Amer >60 >60 mL/min    Comment: (NOTE) The eGFR has been calculated using the CKD EPI equation. This calculation has not been validated in all clinical situations. eGFR's persistently <60 mL/min signify possible Chronic Kidney Disease.     Anion gap 13 5 - 15  CBC     Status: Abnormal   Collection Time: 02/19/17 11:46 PM  Result Value Ref Range   WBC 11.2 (H) 4.0 - 10.5 K/uL   RBC 4.19 3.87 - 5.11 MIL/uL   Hemoglobin 12.1 12.0 - 15.0  g/dL   HCT 37.9 36.0 - 46.0 %   MCV 90.5 78.0 - 100.0 fL   MCH 28.9 26.0 - 34.0 pg   MCHC 31.9 30.0 - 36.0 g/dL   RDW 14.7 11.5 - 15.5 %   Platelets 375 150 - 400 K/uL  D-dimer, quantitative (not at [facility])     Status: None   Collection Time: 02/19/17 11:46 PM  Result Value Ref Range   D-Dimer, Quant 0.38 0.00 - 0.50 ug/mL-FEU    Comment: (NOTE) At the manufacturer cut-off of 0.50 ug/mL FEU, this assay has been documented to exclude PE with a sensitivity and negative predictive value of 97 to 99%.  At this time, this assay has not been approved by the FDA to exclude DVT/VTE. Results should be correlated with clinical presentation.   I-stat troponin, ED     Status: None   Collection Time: 02/20/17 12:15 AM  Result Value Ref Range   Troponin i, poc 0.00 0.00 - 0.08 ng/mL   Comment 3            Comment: Due to the release kinetics of cTnI, a negative result within the first hours of the onset of symptoms does not rule out myocardial infarction with certainty. If myocardial infarction is still suspected, repeat the test at appropriate intervals.   I-Stat beta hCG blood, ED     Status: None   Collection Time: 02/20/17 12:15 AM  Result Value Ref Range   I-stat hCG, quantitative <5.0 <5 mIU/mL   Comment 3            Comment:   GEST. AGE      CONC.  (mIU/mL)   <=1 WEEK        5 - 50     2 WEEKS       50 - 500     3 WEEKS       100 - 10,000     4 WEEKS     1,000 - 30,000        FEMALE AND NON-PREGNANT FEMALE:     LESS THAN 5 mIU/mL   Glucose, capillary     Status: Abnormal   Collection Time: 02/21/17 12:36 AM  Result Value Ref Range   Glucose-Capillary 142 (H) 65 - 99 mg/dL  Basic metabolic panel     Status: Abnormal   Collection Time: 02/21/17  2:39 AM  Result Value Ref Range    Sodium 137 135 - 145 mmol/L   Potassium 3.8 3.5 - 5.1 mmol/L   Chloride 103 101 - 111 mmol/L   CO2 19 (L) 22 - 32 mmol/L   Glucose, Bld 127 (H) 65 - 99 mg/dL   BUN 7 6 - 20 mg/dL   Creatinine, Ser 0.75 0.44 - 1.00 mg/dL   Calcium 8.7 (L) 8.9 - 10.3 mg/dL   GFR calc non Af Amer >60 >60 mL/min   GFR calc Af Amer >60 >60 mL/min    Comment: (NOTE) The eGFR has been calculated using the CKD EPI equation. This calculation has not been validated in all clinical situations. eGFR's persistently <60 mL/min signify possible Chronic Kidney Disease.    Anion gap 15 5 - 15  CBC with Differential/Platelet     Status: Abnormal   Collection Time: 02/21/17  2:39 AM  Result Value Ref Range   WBC 14.5 (H) 4.0 - 10.5 K/uL   RBC 3.93 3.87 - 5.11 MIL/uL   Hemoglobin 11.1 (L) 12.0 - 15.0 g/dL   HCT  35.2 (L) 36.0 - 46.0 %   MCV 89.6 78.0 - 100.0 fL   MCH 28.2 26.0 - 34.0 pg   MCHC 31.5 30.0 - 36.0 g/dL   RDW 14.7 11.5 - 15.5 %   Platelets 342 150 - 400 K/uL   Neutrophils Relative % 70 %   Neutro Abs 10.2 (H) 1.7 - 7.7 K/uL   Lymphocytes Relative 22 %   Lymphs Abs 3.1 0.7 - 4.0 K/uL   Monocytes Relative 7 %   Monocytes Absolute 1.0 0.1 - 1.0 K/uL   Eosinophils Relative 1 %   Eosinophils Absolute 0.1 0.0 - 0.7 K/uL   Basophils Relative 0 %   Basophils Absolute 0.0 0.0 - 0.1 K/uL  Troponin I     Status: None   Collection Time: 02/21/17  2:39 AM  Result Value Ref Range   Troponin I <0.03 <0.03 ng/mL    Blood Alcohol level:  No results found for: [employer]  Metabolic Disorder Labs: No results found for: HGBA1C, MPG No results found for: PROLACTIN No results found for: CHOL, TRIG, HDL, CHOLHDL, VLDL, LDLCALC  Physical Findings: AIMS: Facial and Oral Movements Muscles of Facial Expression: None, normal Lips and Perioral Area: None, normal Jaw: None, normal Tongue: None, normal,Extremity Movements Upper (arms, wrists, hands, fingers): None, normal Lower (legs, knees, ankles, toes): None,  normal, Trunk Movements Neck, shoulders, hips: None, normal, Overall Severity Severity of abnormal movements (highest score from questions above): None, normal Incapacitation due to abnormal movements: None, normal Patient's awareness of abnormal movements (rate only patient's report): No Awareness, Dental Status Current problems with teeth and/or dentures?: No Does patient usually wear dentures?: No  CIWA:    COWS:     Musculoskeletal: Strength & Muscle Tone: within normal limits Gait & Station: normal Patient leans: N/A  Psychiatric Specialty Exam: Physical Exam  Nursing note and vitals reviewed. Constitutional: She appears well-developed and well-nourished.  Cardiovascular: Normal rate.  Respiratory: Effort normal.  Musculoskeletal: Normal range of motion.  Neurological: She is alert.  Skin: Skin is warm.    Review of Systems  Constitutional: Negative.   HENT: Negative.   Eyes: Negative.   Respiratory: Negative.   Cardiovascular: Negative.   Gastrointestinal: Negative.   Genitourinary: Negative.   Musculoskeletal: Positive for back pain.  Skin: Negative.   Neurological: Negative.   Endo/Heme/Allergies: Negative.   Psychiatric/Behavioral: Positive for depression, hallucinations and suicidal ideas. The patient is nervous/anxious.     Blood pressure (!) 146/94, pulse 95, temperature 98 F (36.7 C), temperature source Oral, resp. rate 16, height _0  (1.676 m), weight 91.2 kg (201 lb), last menstrual period 02/05/2017, SpO2 93 %.Body mass index is 32.44 kg/m.  General Appearance: Casual  Eye Contact:  Good  Speech:  Clear and Coherent and Normal Rate  Volume:  Normal  Mood:  Depressed  Affect:  Flat  Thought Process:  Goal Directed and Descriptions of Associations: Intact  Orientation:  Full (Time, Place, and Person)  Thought Content:  Hallucinations: Auditory Visual  Suicidal Thoughts:  Yes.  without intent/plan  Homicidal Thoughts:  No  Memory:  Immediate;    Good Recent;   Good Remote;   Good  Judgement:  Good  Insight:  Good  Psychomotor Activity:  Normal  Concentration:  Concentration: Good and Attention Span: Good  Recall:  Good  Fund of Knowledge:  Good  Language:  Good  Akathisia:  No  Handed:  Right  AIMS (if indicated):     Assets:  Communication Skills  Desire for Improvement Financial Resources/Insurance Housing Physical Health Social Support Transportation  ADL's:  Intact  Cognition:  WNL  Sleep:      Problems Addressed: Bipolar, depressed  Treatment Plan Summary: Daily contact with patient to assess and evaluate symptoms and progress in treatment, Medication management and Plan is to:  -Continue Abilify 5 mg PO Daily for mood stability -Continue Remeron 15 mg PO QHS for mood stability and sleep -Continue Trazodone 50 mg PO QHS PRN for insomnia -Continue Vistaril 25 mg PO Q6H PRN for anxiety -Encourage group therapy participation    Lewis Shock, FNP 02/21/2017, 12:50 PM

## 2017-02-21 NOTE — Progress Notes (Signed)
Adult Psychoeducational Group Note  Date:  02/21/2017 Time:  7:00 PM  Group Topic/Focus:  Healthy Communication:   The focus of this group is to discuss communication, barriers to communication, as well as healthy ways to communicate with others.  Participation Level:  Active  Participation Quality:  Appropriate  Affect:  Appropriate  Cognitive:  Appropriate  Insight: Appropriate  Engagement in Group:  Engaged  Modes of Intervention:  Activity  Additional Comments:  Pt participated in group activity and discussion.  Merrit Waugh R Lillias Difrancesco 02/21/2017, 7:00 PM

## 2017-02-21 NOTE — Tx Team (Signed)
Interdisciplinary Treatment and Diagnostic Plan Update  02/21/2017 Time of Session: 1007 Meredith Cherry MRN: [id number]  Principal Diagnosis: Bipolar affect, depressed ([name])  Secondary Diagnoses: Principal Problem:   Bipolar affect, depressed ([name])   Current Medications:  Current Facility-Administered Medications  Medication Dose Route Frequency Provider Last Rate Last Dose  . acetaminophen (TYLENOL) tablet 650 mg  650 mg Oral Q6H PRN Elmarie Shiley A, NP   650 mg at 02/20/17 1702  . alum & mag hydroxide-simeth (MAALOX/MYLANTA) 200-200-20 MG/5ML suspension 30 mL  30 mL Oral Q4H PRN Elmarie Shiley A, NP   30 mL at 02/20/17 1856  . ARIPiprazole (ABILIFY) tablet 5 mg  5 mg Oral Daily Izediuno, Vincent A, MD   5 mg at 02/20/17 1659  . hydrOXYzine (ATARAX/VISTARIL) tablet 25 mg  25 mg Oral Q6H PRN Niel Hummer, NP   25 mg at 02/20/17 2327  . ibuprofen (ADVIL,MOTRIN) tablet 600 mg  600 mg Oral Q6H PRN Laverle Hobby, PA-C   600 mg at 02/21/17 0805  . magnesium hydroxide (MILK OF MAGNESIA) suspension 30 mL  30 mL Oral Daily PRN Elmarie Shiley A, NP      . metFORMIN (GLUCOPHAGE) tablet 1,000 mg  1,000 mg Oral BID WC Izediuno, Laruth Bouchard, MD   1,000 mg at 02/21/17 0805  . metoprolol tartrate (LOPRESSOR) tablet 25 mg  25 mg Oral BID Laverle Hobby, PA-C   25 mg at 02/21/17 0805  . mirtazapine (REMERON) tablet 15 mg  15 mg Oral QHS Izediuno, Laruth Bouchard, MD   15 mg at 02/20/17 2207  . nitrofurantoin (macrocrystal-monohydrate) (MACROBID) capsule 100 mg  100 mg Oral Q12H Izediuno, Vincent A, MD   100 mg at 02/21/17 0805  . prazosin (MINIPRESS) capsule 4 mg  4 mg Oral QHS Izediuno, Laruth Bouchard, MD   4 mg at 02/20/17 2207  . traZODone (DESYREL) tablet 50 mg  50 mg Oral QHS PRN Niel Hummer, NP   50 mg at 02/20/17 2326   PTA Medications: Medications Prior to Admission  Medication Sig Dispense Refill Last Dose  . albuterol (PROVENTIL) (2.5 MG/3ML) 0.083% nebulizer solution Take 2.5 mg by nebulization every  6 (six) hours as needed for wheezing or shortness of breath.    Past Week at Unknown time  . aspirin EC 81 MG EC tablet Take 1 tablet (81 mg total) by mouth daily. 30 tablet 0 02/19/2017 at Unknown time  . clonazePAM (KLONOPIN) 0.5 MG tablet Take 1 tablet (0.5 mg total) by mouth 3 (three) times daily. 15 tablet 0 02/19/2017 at Unknown time  . cloNIDine (CATAPRES) 0.1 MG tablet Take 0.1 mg by mouth 2 (two) times daily.   02/19/2017 at Unknown time  . diltiazem (CARDIZEM CD) 240 MG 24 hr capsule Take 240 mg by mouth daily.   02/19/2017 at Unknown time  . divalproex (DEPAKOTE) 500 MG DR tablet Take 1 tablet (500 mg total) by mouth every 12 (twelve) hours. 60 tablet 0 02/19/2017 at Unknown time  . EPINEPHrine 0.15 MG/0.15ML IJ injection Inject 0.15 mg into the muscle as needed for anaphylaxis.    unknown  . ibuprofen (ADVIL,MOTRIN) 800 MG tablet Take 800 mg by mouth 3 (three) times daily.   02/19/2017 at Unknown time  . losartan (COZAAR) 100 MG tablet Take 100 mg by mouth daily.   02/19/2017 at Unknown time  . metFORMIN (GLUCOPHAGE) 1000 MG tablet Take 1,000 mg by mouth 2 (two) times daily.   02/19/2017 at Unknown time  .  nitroGLYCERIN (NITROSTAT) 0.4 MG SL tablet Place 0.4 mg under the tongue every 5 (five) minutes as needed for chest pain.    02/18/2017 at Unknown time  . omeprazole (PRILOSEC) 20 MG capsule Take 20 mg by mouth daily.   02/19/2017 at Unknown time  . pravastatin (PRAVACHOL) 20 MG tablet Take 20 mg by mouth daily.    02/19/2017 at Unknown time  . QUEtiapine (SEROQUEL) 300 MG tablet Take 300 mg by mouth at bedtime.   02/18/2017 at Unknown time  . ranitidine (ZANTAC) 150 MG tablet Take 150 mg by mouth at bedtime.   02/18/2017 at Unknown time  . SUMAtriptan (IMITREX) 100 MG tablet Take 100 mg by mouth every 2 (two) hours as needed for migraine.    Past Month at Unknown time  . tiZANidine (ZANAFLEX) 2 MG tablet Take 2 mg by mouth every 6 (six) hours as needed for muscle spasms.   02/19/2017 at Unknown time   . topiramate (TOPAMAX) 100 MG tablet Take 100 mg by mouth daily as needed (headache).    Past Week at Unknown time  . traMADol (ULTRAM) 50 MG tablet Take 50 mg by mouth every 6 (six) hours as needed for moderate pain.   Past Week at Unknown time  . traZODone (DESYREL) 50 MG tablet Take 50 mg by mouth at bedtime.   02/18/2017 at Unknown time  . gabapentin (NEURONTIN) 400 MG capsule Take 1 capsule (400 mg total) by mouth 3 (three) times daily. (Patient not taking: Reported on 02/20/2017) 90 capsule 0 Not Taking at Unknown time  . prazosin (MINIPRESS) 2 MG capsule Take 1 capsule (2 mg total) by mouth at bedtime. (Patient not taking: Reported on 02/20/2017) 30 capsule 0 Not Taking at Unknown time    Patient Stressors: Health problems Marital or family conflict Traumatic event  Patient Strengths: Ability for insight Average or above average intelligence Capable of independent living General fund of knowledge Motivation for treatment/growth  Treatment Modalities: Medication Management, Group therapy, Case management,  1 to 1 session with clinician, Psychoeducation, Recreational therapy.   Physician Treatment Plan for Primary Diagnosis: Bipolar affect, depressed ([name]) Long Term Goal(s): Improvement in symptoms so as ready for discharge Improvement in symptoms so as ready for discharge   Short Term Goals: Ability to identify changes in lifestyle to reduce recurrence of condition will improve Ability to verbalize feelings will improve Ability to disclose and discuss suicidal ideas Ability to demonstrate self-control will improve Ability to identify and develop effective coping behaviors will improve Ability to maintain clinical measurements within normal limits will improve Compliance with prescribed medications will improve Ability to identify changes in lifestyle to reduce recurrence of condition will improve Ability to verbalize feelings will improve Ability to disclose and discuss  suicidal ideas Ability to demonstrate self-control will improve Ability to identify and develop effective coping behaviors will improve Ability to maintain clinical measurements within normal limits will improve Compliance with prescribed medications will improve  Medication Management: Evaluate patient's response, side effects, and tolerance of medication regimen.  Therapeutic Interventions: 1 to 1 sessions, Unit Group sessions and Medication administration.  Evaluation of Outcomes: Progressing  Physician Treatment Plan for Secondary Diagnosis: Principal Problem:   Bipolar affect, depressed ([place])  Long Term Goal(s): Improvement in symptoms so as ready for discharge Improvement in symptoms so as ready for discharge   Short Term Goals: Ability to identify changes in lifestyle to reduce recurrence of condition will improve Ability to verbalize feelings will improve Ability to disclose and  discuss suicidal ideas Ability to demonstrate self-control will improve Ability to identify and develop effective coping behaviors will improve Ability to maintain clinical measurements within normal limits will improve Compliance with prescribed medications will improve Ability to identify changes in lifestyle to reduce recurrence of condition will improve Ability to verbalize feelings will improve Ability to disclose and discuss suicidal ideas Ability to demonstrate self-control will improve Ability to identify and develop effective coping behaviors will improve Ability to maintain clinical measurements within normal limits will improve Compliance with prescribed medications will improve     Medication Management: Evaluate patient's response, side effects, and tolerance of medication regimen.  Therapeutic Interventions: 1 to 1 sessions, Unit Group sessions and Medication administration.  Evaluation of Outcomes: Progressing   RN Treatment Plan for Primary Diagnosis: Bipolar affect, depressed  ([place]) Long Term Goal(s): Knowledge of disease and therapeutic regimen to maintain health will improve  Short Term Goals: Ability to identify and develop effective coping behaviors will improve and Compliance with prescribed medications will improve  Medication Management: RN will administer medications as ordered by provider, will assess and evaluate patient's response and provide education to patient for prescribed medication. RN will report any adverse and/or side effects to prescribing provider.  Therapeutic Interventions: 1 on 1 counseling sessions, Psychoeducation, Medication administration, Evaluate responses to treatment, Monitor vital signs and CBGs as ordered, Perform/monitor CIWA, COWS, AIMS and Fall Risk screenings as ordered, Perform wound care treatments as ordered.  Evaluation of Outcomes: Progressing   LCSW Treatment Plan for Primary Diagnosis: Bipolar affect, depressed ([place]) Long Term Goal(s): Safe transition to appropriate next level of care at discharge, Engage patient in therapeutic group addressing interpersonal concerns.  Short Term Goals: Engage patient in aftercare planning with referrals and resources, Increase social support and Increase skills for wellness and recovery  Therapeutic Interventions: Assess for all discharge needs, 1 to 1 time with Social worker, Explore available resources and support systems, Assess for adequacy in community support network, Educate family and significant other(s) on suicide prevention, Complete Psychosocial Assessment, Interpersonal group therapy.  Evaluation of Outcomes: Progressing   Progress in Treatment: Attending groups: Yes. Participating in groups: Yes. Taking medication as prescribed: Yes. Toleration medication: Yes. Family/Significant other contact made: No, will contact:  pt refused Patient understands diagnosis: Yes. Discussing patient identified problems/goals with staff: Yes. Medical problems stabilized or resolved:  Yes. Denies suicidal/homicidal ideation: Yes. Issues/concerns per patient self-inventory: No. Other: none  New problem(s) identified: No, Describe:  none  New Short Term/Long Term Goal(s): Pt goal: "get my meds straight, get out of my relationship with my husband."  Discharge Plan or Barriers:   Reason for Continuation of Hospitalization: Depression Medication stabilization  Estimated Length of Stay: 3-5 days.  Attendees: Patient: Nohealani Medinger 02/21/2017   Physician: Dr Sanjuana Letters, MD 02/21/2017   Nursing: Darrol Angel, RN 02/21/2017   RN Care Manager: 02/21/2017   Social Worker: Lurline Idol, LCSW 02/21/2017   Recreational Therapist:  02/21/2017   Other:  02/21/2017   Other:  02/21/2017   Other: 02/21/2017     Scribe for Treatment Team: Joanne Chars, [place] 02/21/2017 10:37 AM

## 2017-02-21 NOTE — ED Notes (Signed)
[facility] called for transport. May be some delay.

## 2017-02-21 NOTE — [facility] Group Notes (Signed)
[facility] Group Therapy 02/21/2017 1:15pm  Type of Therapy: Mental Health Association Presentation  Participation Level: Invited. DID NOT ATTEND. Pt chose to remain in bed.   Summary of Progress/Problems: [facility]) Speaker came to talk about his personal journey with mental health. The pt processed ways by which to relate to the speaker. Carmen speaker provided handouts and educational information pertaining to groups and services offered by the [facility]. Pt was engaged in speaker's presentation and was receptive to resources provided.    [employer], LCSW 02/21/2017 10:16 AM

## 2017-02-21 NOTE — ED Notes (Signed)
Patient transported to CT 

## 2017-02-21 NOTE — ED Triage Notes (Signed)
Fell yesterday at went to [facility] ER and evaluated and now here for headache and abdominal pain clear speech noted moves all extremities.

## 2017-02-21 NOTE — ED Notes (Signed)
Sitter at bedside.

## 2017-02-21 NOTE — Progress Notes (Signed)
Pt returned to unit from the ED.  Pt was placed on 1:1 d/t high fall risk, because pt acuity is high.  A CT scan was done which was normal.  She was given Tylenol 650 mg for pain at the ED.  A set of vital signs were obtained after her return.  Sitter at pt's bedside.  Pt safe at this time.

## 2017-02-22 ENCOUNTER — Encounter (HOSPITAL_COMMUNITY): Payer: Self-pay

## 2017-02-22 ENCOUNTER — Inpatient Hospital Stay (HOSPITAL_COMMUNITY): Payer: Medicare PPO

## 2017-02-22 ENCOUNTER — Other Ambulatory Visit: Payer: Self-pay

## 2017-02-22 ENCOUNTER — Inpatient Hospital Stay (HOSPITAL_COMMUNITY)
Admission: AD | Admit: 2017-02-22 | Discharge: 2017-02-26 | DRG: 305 | Disposition: A | Discharge status: Other Acute Inpatient Hospital | Payer: Medicare PPO | Source: Ambulatory Visit | Attending: Internal Medicine | Admitting: Internal Medicine

## 2017-02-22 DIAGNOSIS — I10 Essential (primary) hypertension: Secondary | ICD-10-CM

## 2017-02-22 DIAGNOSIS — J309 Allergic rhinitis, unspecified: Secondary | ICD-10-CM | POA: Diagnosis present

## 2017-02-22 DIAGNOSIS — K219 Gastro-esophageal reflux disease without esophagitis: Secondary | ICD-10-CM | POA: Diagnosis not present

## 2017-02-22 DIAGNOSIS — F410 Panic disorder [episodic paroxysmal anxiety] without agoraphobia: Secondary | ICD-10-CM | POA: Diagnosis present

## 2017-02-22 DIAGNOSIS — Z79891 Long term (current) use of opiate analgesic: Secondary | ICD-10-CM | POA: Diagnosis not present

## 2017-02-22 DIAGNOSIS — F4310 Post-traumatic stress disorder, unspecified: Secondary | ICD-10-CM | POA: Diagnosis not present

## 2017-02-22 DIAGNOSIS — F445 Conversion disorder with seizures or convulsions: Secondary | ICD-10-CM | POA: Diagnosis present

## 2017-02-22 DIAGNOSIS — Z7984 Long term (current) use of oral hypoglycemic drugs: Secondary | ICD-10-CM | POA: Diagnosis not present

## 2017-02-22 DIAGNOSIS — F902 Attention-deficit hyperactivity disorder, combined type: Secondary | ICD-10-CM | POA: Diagnosis not present

## 2017-02-22 DIAGNOSIS — K760 Fatty (change of) liver, not elsewhere classified: Secondary | ICD-10-CM | POA: Diagnosis present

## 2017-02-22 DIAGNOSIS — G9349 Other encephalopathy: Secondary | ICD-10-CM | POA: Diagnosis present

## 2017-02-22 DIAGNOSIS — R45851 Suicidal ideations: Secondary | ICD-10-CM | POA: Diagnosis present

## 2017-02-22 DIAGNOSIS — G9340 Encephalopathy, unspecified: Secondary | ICD-10-CM | POA: Diagnosis not present

## 2017-02-22 DIAGNOSIS — Z7982 Long term (current) use of aspirin: Secondary | ICD-10-CM | POA: Diagnosis not present

## 2017-02-22 DIAGNOSIS — E1143 Type 2 diabetes mellitus with diabetic autonomic (poly)neuropathy: Secondary | ICD-10-CM | POA: Diagnosis present

## 2017-02-22 DIAGNOSIS — Z833 Family history of diabetes mellitus: Secondary | ICD-10-CM | POA: Diagnosis not present

## 2017-02-22 DIAGNOSIS — I160 Hypertensive urgency: Secondary | ICD-10-CM | POA: Diagnosis present

## 2017-02-22 DIAGNOSIS — Z880 Allergy status to penicillin: Secondary | ICD-10-CM | POA: Diagnosis not present

## 2017-02-22 DIAGNOSIS — F319 Bipolar disorder, unspecified: Secondary | ICD-10-CM | POA: Diagnosis present

## 2017-02-22 DIAGNOSIS — F450 Somatization disorder: Secondary | ICD-10-CM | POA: Diagnosis present

## 2017-02-22 DIAGNOSIS — F909 Attention-deficit hyperactivity disorder, unspecified type: Secondary | ICD-10-CM | POA: Diagnosis present

## 2017-02-22 DIAGNOSIS — Z87891 Personal history of nicotine dependence: Secondary | ICD-10-CM | POA: Diagnosis not present

## 2017-02-22 DIAGNOSIS — Z8249 Family history of ischemic heart disease and other diseases of the circulatory system: Secondary | ICD-10-CM | POA: Diagnosis not present

## 2017-02-22 DIAGNOSIS — Z881 Allergy status to other antibiotic agents status: Secondary | ICD-10-CM | POA: Diagnosis not present

## 2017-02-22 DIAGNOSIS — R51 Headache: Secondary | ICD-10-CM | POA: Diagnosis present

## 2017-02-22 DIAGNOSIS — Z888 Allergy status to other drugs, medicaments and biological substances status: Secondary | ICD-10-CM | POA: Diagnosis not present

## 2017-02-22 DIAGNOSIS — Z9049 Acquired absence of other specified parts of digestive tract: Secondary | ICD-10-CM | POA: Diagnosis not present

## 2017-02-22 HISTORY — DX: Essential (primary) hypertension: I10

## 2017-02-22 LAB — COMPREHENSIVE METABOLIC PANEL
ALBUMIN: 4 g/dL (ref 3.5–5.0)
ALT: 19 U/L (ref 14–54)
AST: 24 U/L (ref 15–41)
Alkaline Phosphatase: 77 U/L (ref 38–126)
Anion gap: 10 (ref 5–15)
BUN: 11 mg/dL (ref 6–20)
CHLORIDE: 106 mmol/L (ref 101–111)
CO2: 22 mmol/L (ref 22–32)
Calcium: 8.6 mg/dL — ABNORMAL LOW (ref 8.9–10.3)
Creatinine, Ser: 0.72 mg/dL (ref 0.44–1.00)
GFR calc Af Amer: >60 mL/min (ref >60)
GLUCOSE: 126 mg/dL — AB (ref 65–99)
POTASSIUM: 3.5 mmol/L (ref 3.5–5.1)
Sodium: 138 mmol/L (ref 135–145)
Total Bilirubin: 0.1 mg/dL — ABNORMAL LOW (ref 0.3–1.2)
Total Protein: 7.3 g/dL (ref 6.5–8.1)

## 2017-02-22 LAB — CBC WITH DIFFERENTIAL/PLATELET
BASOS ABS: 0 10*3/uL (ref 0.0–0.1)
BASOS PCT: 0 %
Eosinophils Absolute: 0.1 10*3/uL (ref 0.0–0.7)
Eosinophils Relative: 1 %
HEMATOCRIT: 34.8 % — AB (ref 36.0–46.0)
HEMOGLOBIN: 11 g/dL — AB (ref 12.0–15.0)
Lymphocytes Relative: 28 %
Lymphs Abs: 3.2 10*3/uL (ref 0.7–4.0)
MCH: 28.4 pg (ref 26.0–34.0)
MCHC: 31.6 g/dL (ref 30.0–36.0)
MCV: 89.7 fL (ref 78.0–100.0)
MONO ABS: 0.9 10*3/uL (ref 0.1–1.0)
Monocytes Relative: 8 %
NEUTROS ABS: 7.1 10*3/uL (ref 1.7–7.7)
NEUTROS PCT: 63 %
Platelets: 389 10*3/uL (ref 150–400)
RBC: 3.88 MIL/uL (ref 3.87–5.11)
RDW: 14.7 % (ref 11.5–15.5)
WBC: 11.2 10*3/uL — ABNORMAL HIGH (ref 4.0–10.5)

## 2017-02-22 LAB — TROPONIN I

## 2017-02-22 LAB — HEMOGLOBIN A1C
Hgb A1c MFr Bld: 6 % — ABNORMAL HIGH (ref 4.8–5.6)
Mean Plasma Glucose: 125.5 mg/dL

## 2017-02-22 LAB — LIPASE, BLOOD: LIPASE: 25 U/L (ref 11–51)

## 2017-02-22 LAB — ACETAMINOPHEN LEVEL

## 2017-02-22 LAB — MAGNESIUM: MAGNESIUM: 2 mg/dL (ref 1.7–2.4)

## 2017-02-22 LAB — TSH: TSH: 2.797 u[IU]/mL (ref 0.350–4.500)

## 2017-02-22 LAB — SALICYLATE LEVEL

## 2017-02-22 MED ORDER — INSULIN ASPART 100 UNIT/ML ~~LOC~~ SOLN: 0.0000 [IU] | Freq: Three times a day (TID) | SUBCUTANEOUS | Status: DC

## 2017-02-22 MED ORDER — HYDRALAZINE HCL 20 MG/ML IJ SOLN: 5.0000 mg | INTRAMUSCULAR | Status: DC

## 2017-02-22 MED ORDER — LABETALOL HCL 5 MG/ML IV SOLN
20.0000 mg | INTRAVENOUS | Status: DC | PRN
  Administered 2017-02-22 (×3): 20 mg via INTRAVENOUS
  Filled 2017-02-22 (×3): qty 4

## 2017-02-22 MED ORDER — LOSARTAN POTASSIUM 50 MG PO TABS
100.0000 mg | ORAL_TABLET | Freq: Every day | ORAL | Status: DC
  Filled 2017-02-22: qty 2

## 2017-02-22 MED ORDER — PRAZOSIN HCL 1 MG PO CAPS
4.0000 mg | ORAL_CAPSULE | Freq: Every day | ORAL | Status: DC
  Filled 2017-02-22: qty 4

## 2017-02-22 MED ORDER — ALUM & MAG HYDROXIDE-SIMETH 200-200-20 MG/5ML PO SUSP
30.0000 mL | ORAL | Status: DC | PRN
  Administered 2017-02-25: 30 mL via ORAL
  Filled 2017-02-22: qty 30

## 2017-02-22 MED ORDER — HYDROXYZINE HCL 25 MG PO TABS: 25.0000 mg | ORAL_TABLET | Freq: Four times a day (QID) | ORAL | Status: DC | PRN

## 2017-02-22 MED ORDER — MAGNESIUM HYDROXIDE 400 MG/5ML PO SUSP
30.0000 mL | Freq: Every day | ORAL | Status: DC | PRN
  Administered 2017-02-25: 30 mL via ORAL
  Filled 2017-02-22: qty 30

## 2017-02-22 MED ORDER — FAMOTIDINE 20 MG PO TABS: 20.0000 mg | ORAL_TABLET | Freq: Every day | ORAL | Status: DC

## 2017-02-22 MED ORDER — PANTOPRAZOLE SODIUM 40 MG PO TBEC
40.0000 mg | DELAYED_RELEASE_TABLET | Freq: Every day | ORAL | Status: DC
  Administered 2017-02-23 – 2017-02-26 (×4): 40 mg via ORAL
  Filled 2017-02-22 (×4): qty 1

## 2017-02-22 MED ORDER — PRAVASTATIN SODIUM 20 MG PO TABS
20.0000 mg | ORAL_TABLET | Freq: Every day | ORAL | Status: DC
  Filled 2017-02-22: qty 1

## 2017-02-22 MED ORDER — ACETAMINOPHEN 650 MG RE SUPP: 650.0000 mg | Freq: Four times a day (QID) | RECTAL | Status: DC | PRN

## 2017-02-22 MED ORDER — QUETIAPINE FUMARATE 300 MG PO TABS: 300.0000 mg | ORAL_TABLET | Freq: Every day | ORAL | Status: DC

## 2017-02-22 MED ORDER — METOPROLOL TARTRATE 50 MG PO TABS
50.0000 mg | ORAL_TABLET | Freq: Two times a day (BID) | ORAL | Status: DC
  Administered 2017-02-22 – 2017-02-26 (×8): 50 mg via ORAL
  Filled 2017-02-22 (×8): qty 1

## 2017-02-22 MED ORDER — CLONIDINE HCL 0.1 MG PO TABS: 0.1000 mg | ORAL_TABLET | Freq: Three times a day (TID) | ORAL | Status: DC

## 2017-02-22 MED ORDER — ACETAMINOPHEN 325 MG PO TABS: 650.0000 mg | ORAL_TABLET | Freq: Four times a day (QID) | ORAL | Status: DC | PRN

## 2017-02-22 MED ORDER — IOPAMIDOL (ISOVUE-300) INJECTION 61%
100.0000 mL | Freq: Once | INTRAVENOUS | Status: AC | PRN
  Administered 2017-02-22: 100 mL via INTRAVENOUS

## 2017-02-22 MED ORDER — GABAPENTIN 400 MG PO CAPS: 400.0000 mg | ORAL_CAPSULE | Freq: Three times a day (TID) | ORAL | Status: DC

## 2017-02-22 MED ORDER — CLONIDINE HCL 0.1 MG PO TABS: 0.2000 mg | ORAL_TABLET | Freq: Three times a day (TID) | ORAL | Status: DC | PRN

## 2017-02-22 MED ORDER — PRAVASTATIN SODIUM 20 MG PO TABS
20.0000 mg | ORAL_TABLET | Freq: Every day | ORAL | Status: DC
  Administered 2017-02-23 – 2017-02-26 (×4): 20 mg via ORAL
  Filled 2017-02-22 (×4): qty 1

## 2017-02-22 MED ORDER — QUETIAPINE FUMARATE 100 MG PO TABS
300.0000 mg | ORAL_TABLET | Freq: Every day | ORAL | Status: DC
  Administered 2017-02-22 – 2017-02-25 (×4): 300 mg via ORAL
  Filled 2017-02-22 (×4): qty 3

## 2017-02-22 MED ORDER — LABETALOL HCL 5 MG/ML IV SOLN
10.0000 mg | INTRAVENOUS | Status: DC | PRN
  Filled 2017-02-22: qty 4

## 2017-02-22 MED ORDER — CLONIDINE HCL 0.1 MG PO TABS
0.2000 mg | ORAL_TABLET | Freq: Once | ORAL | Status: AC
  Administered 2017-02-22: 0.2 mg via ORAL
  Filled 2017-02-22: qty 2

## 2017-02-22 MED ORDER — DILTIAZEM HCL ER COATED BEADS 300 MG PO CP24
300.0000 mg | ORAL_CAPSULE | Freq: Every day | ORAL | Status: DC
  Filled 2017-02-22: qty 1

## 2017-02-22 MED ORDER — GABAPENTIN 400 MG PO CAPS
400.0000 mg | ORAL_CAPSULE | Freq: Three times a day (TID) | ORAL | Status: DC
  Administered 2017-02-22 – 2017-02-26 (×11): 400 mg via ORAL
  Filled 2017-02-22 (×11): qty 1

## 2017-02-22 MED ORDER — CLONIDINE HCL 0.1 MG PO TABS
0.1000 mg | ORAL_TABLET | Freq: Three times a day (TID) | ORAL | Status: DC
  Administered 2017-02-22 – 2017-02-26 (×11): 0.1 mg via ORAL
  Filled 2017-02-22 (×11): qty 1

## 2017-02-22 MED ORDER — ACETAMINOPHEN 325 MG PO TABS
650.0000 mg | ORAL_TABLET | Freq: Four times a day (QID) | ORAL | Status: DC | PRN
  Administered 2017-02-23 – 2017-02-26 (×6): 650 mg via ORAL
  Filled 2017-02-22 (×6): qty 2

## 2017-02-22 MED ORDER — ENOXAPARIN SODIUM 40 MG/0.4ML ~~LOC~~ SOLN: 40.0000 mg | SUBCUTANEOUS | Status: DC

## 2017-02-22 MED ORDER — NITROFURANTOIN MONOHYD MACRO 100 MG PO CAPS
100.0000 mg | ORAL_CAPSULE | Freq: Two times a day (BID) | ORAL | Status: DC
  Administered 2017-02-22 – 2017-02-26 (×8): 100 mg via ORAL
  Filled 2017-02-22 (×8): qty 1

## 2017-02-22 MED ORDER — INSULIN ASPART 100 UNIT/ML ~~LOC~~ SOLN
0.0000 [IU] | Freq: Three times a day (TID) | SUBCUTANEOUS | Status: DC
  Administered 2017-02-23 – 2017-02-25 (×4): 1 [IU] via SUBCUTANEOUS

## 2017-02-22 MED ORDER — LOSARTAN POTASSIUM 50 MG PO TABS
100.0000 mg | ORAL_TABLET | Freq: Every day | ORAL | Status: DC
  Administered 2017-02-22 – 2017-02-26 (×5): 100 mg via ORAL
  Filled 2017-02-22 (×4): qty 2

## 2017-02-22 MED ORDER — DILTIAZEM HCL ER COATED BEADS 240 MG PO CP24
240.0000 mg | ORAL_CAPSULE | Freq: Every day | ORAL | Status: DC
  Filled 2017-02-22: qty 1

## 2017-02-22 MED ORDER — CLONAZEPAM 0.5 MG PO TABS
0.5000 mg | ORAL_TABLET | Freq: Three times a day (TID) | ORAL | Status: DC
  Administered 2017-02-22 – 2017-02-26 (×11): 0.5 mg via ORAL
  Filled 2017-02-22 (×11): qty 1

## 2017-02-22 MED ORDER — MIRTAZAPINE 15 MG PO TABS
15.0000 mg | ORAL_TABLET | Freq: Every day | ORAL | Status: DC
  Administered 2017-02-22 – 2017-02-25 (×4): 15 mg via ORAL
  Filled 2017-02-22 (×4): qty 1

## 2017-02-22 MED ORDER — IOPAMIDOL (ISOVUE-300) INJECTION 61%
INTRAVENOUS | Status: AC
  Filled 2017-02-22: qty 100

## 2017-02-22 MED ORDER — FUROSEMIDE 10 MG/ML IJ SOLN: 40.0000 mg | Freq: Once | INTRAMUSCULAR | Status: DC

## 2017-02-22 MED ORDER — ARIPIPRAZOLE 5 MG PO TABS
5.0000 mg | ORAL_TABLET | Freq: Every day | ORAL | Status: DC
  Administered 2017-02-23 – 2017-02-26 (×4): 5 mg via ORAL
  Filled 2017-02-22 (×4): qty 1

## 2017-02-22 MED ORDER — MAGNESIUM SULFATE 2 GM/50ML IV SOLN
2.0000 g | Freq: Once | INTRAVENOUS | Status: AC
  Administered 2017-02-22: 2 g via INTRAVENOUS
  Filled 2017-02-22: qty 50

## 2017-02-22 MED ORDER — PANTOPRAZOLE SODIUM 40 MG PO TBEC: 40.0000 mg | DELAYED_RELEASE_TABLET | Freq: Every day | ORAL | Status: DC

## 2017-02-22 MED ORDER — DIVALPROEX SODIUM 500 MG PO DR TAB: 500.0000 mg | DELAYED_RELEASE_TABLET | Freq: Two times a day (BID) | ORAL | Status: DC

## 2017-02-22 MED ORDER — TOPIRAMATE 25 MG PO TABS: 50.0000 mg | ORAL_TABLET | Freq: Two times a day (BID) | ORAL | Status: DC

## 2017-02-22 MED ORDER — TOPIRAMATE 25 MG PO TABS
50.0000 mg | ORAL_TABLET | Freq: Two times a day (BID) | ORAL | Status: DC
  Administered 2017-02-22 – 2017-02-23 (×2): 50 mg via ORAL
  Filled 2017-02-22 (×2): qty 2

## 2017-02-22 MED ORDER — TRAZODONE HCL 50 MG PO TABS
50.0000 mg | ORAL_TABLET | Freq: Every day | ORAL | Status: DC
  Administered 2017-02-23 – 2017-02-25 (×3): 50 mg via ORAL
  Filled 2017-02-22 (×4): qty 1

## 2017-02-22 MED ORDER — METOPROLOL TARTRATE 25 MG PO TABS: 50.0000 mg | ORAL_TABLET | Freq: Two times a day (BID) | ORAL | Status: DC

## 2017-02-22 MED ORDER — DILTIAZEM HCL ER COATED BEADS 240 MG PO CP24
240.0000 mg | ORAL_CAPSULE | Freq: Every day | ORAL | Status: DC
  Administered 2017-02-22 – 2017-02-26 (×5): 240 mg via ORAL
  Filled 2017-02-22 (×4): qty 1

## 2017-02-22 MED ORDER — TIZANIDINE HCL 4 MG PO TABS
2.0000 mg | ORAL_TABLET | Freq: Four times a day (QID) | ORAL | Status: DC | PRN
  Administered 2017-02-24: 2 mg via ORAL
  Filled 2017-02-22: qty 1

## 2017-02-22 MED ORDER — FUROSEMIDE 10 MG/ML IJ SOLN
40.0000 mg | Freq: Once | INTRAMUSCULAR | Status: AC
  Administered 2017-02-22: 40 mg via INTRAVENOUS
  Filled 2017-02-22: qty 4

## 2017-02-22 MED ORDER — DIPHENHYDRAMINE HCL 50 MG/ML IJ SOLN
25.0000 mg | Freq: Once | INTRAMUSCULAR | Status: AC
  Administered 2017-02-22: 25 mg via INTRAVENOUS
  Filled 2017-02-22: qty 1

## 2017-02-22 MED ORDER — PRAZOSIN HCL 1 MG PO CAPS
4.0000 mg | ORAL_CAPSULE | Freq: Every day | ORAL | Status: DC
  Administered 2017-02-22 – 2017-02-25 (×4): 4 mg via ORAL
  Filled 2017-02-22 (×4): qty 4

## 2017-02-22 MED ORDER — METOCLOPRAMIDE HCL 5 MG/ML IJ SOLN
10.0000 mg | Freq: Once | INTRAMUSCULAR | Status: AC
  Administered 2017-02-22: 10 mg via INTRAVENOUS
  Filled 2017-02-22: qty 2

## 2017-02-22 MED ORDER — SODIUM CHLORIDE 0.9 % IV BOLUS (SEPSIS)
500.0000 mL | Freq: Once | INTRAVENOUS | Status: AC
  Administered 2017-02-22: 500 mL via INTRAVENOUS

## 2017-02-22 MED ORDER — TIZANIDINE HCL 4 MG PO TABS: 2.0000 mg | ORAL_TABLET | Freq: Four times a day (QID) | ORAL | Status: DC | PRN

## 2017-02-22 MED ORDER — CLONAZEPAM 0.5 MG PO TABS
0.5000 mg | ORAL_TABLET | Freq: Three times a day (TID) | ORAL | Status: DC
  Administered 2017-02-22: 0.5 mg via ORAL
  Filled 2017-02-22: qty 1

## 2017-02-22 MED ORDER — SODIUM CHLORIDE 0.9 % IV BOLUS (SEPSIS)
1000.0000 mL | Freq: Once | INTRAVENOUS | Status: AC
  Administered 2017-02-22: 1000 mL via INTRAVENOUS

## 2017-02-22 NOTE — Progress Notes (Signed)
Pt is still at the ED at this time.

## 2017-02-22 NOTE — ED Notes (Signed)
Pt transported to MRI 

## 2017-02-22 NOTE — ED Provider Notes (Signed)
2:28 PM Patient's blood pressure now with diastolic less than 568, she is calm, in no distress, with no vomiting, no evidence for decompensated state. No lab evidence for endorgan effects of hypertension, and following recommendations for increased medication, blood pressure is now well controlled. However, the patient's behavioral health team requests medicine admission.   Meredith Muskrat, MD 02/22/17 [phone]
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
## 2017-02-22 NOTE — Progress Notes (Signed)
Pt still at the ED.

## 2017-02-22 NOTE — ED Notes (Signed)
Bed: WA17 Expected date:  Expected time:  Means of arrival:  Comments: [place]

## 2017-02-22 NOTE — ED Notes (Signed)
ED TO INPATIENT HANDOFF REPORT  Name/Age/Gender Meredith Cherry 47 y.o. female  Code Status    Code Status Orders  (From admission, onward)        Start     Ordered   02/22/17 1443  Full code  Continuous     02/22/17 1444    Code Status History    Date Active Date Inactive Code Status Order ID Comments User Context   02/19/2017 21:18 02/21/2017 18:14 Full Code [id number]  Niel Hummer, NP Inpatient   11/14/2015 20:17 11/17/2015 21:05 Full Code [id number]  Etta Quill, DO Inpatient      Home/SNF/Other Behavioral health  Chief Complaint Nonspecific chest pain [R07.9]  Level of Care/Admitting Diagnosis ED Disposition    ED Disposition Condition [place]: [facility] [[id number]]  Level of Care: Telemetry [5]  Admit to tele based on following criteria: Other see comments  Admit to tele based on following criteria: Complex arrhythmia (Bradycardia/Tachycardia)  Comments: sob  Diagnosis: Uncontrolled hypertension [569794]  Admitting Physician: Reyne Dumas [[id number]]  Attending Physician: Reyne Dumas [[id number]]  Estimated length of stay: past midnight tomorrow  Certification:: I certify this patient will need inpatient services for at least 2 midnights  PT Class (Do Not Modify): Inpatient [101]  PT Acc Code (Do Not Modify): Private [1]       Medical History Past Medical History:  Diagnosis Date  . ADHD (attention deficit hyperactivity disorder)   . Allergic rhinitis   . Anxiety   . Asthma   . Bipolar 1 disorder ([place])   . Chiari malformation type I ([place])   . Chronic migraine   . Depression   . Diabetes mellitus without complication ([place])   . GERD (gastroesophageal reflux disease)   . Hypertension   . Panic attack   . Psychiatric disorder     Allergies Allergies  Allergen Reactions  . Bactrim [Sulfamethoxazole-Trimethoprim] Other (See Comments)    Unknown  . Ciprofloxacin Rash  . Penicillins Other (See Comments)     Unknown  . Sulfa Antibiotics Rash    IV Location/Drains/Wounds Patient Lines/Drains/Airways Status   Active Line/Drains/Airways    Name:   Placement date:   Placement time:   Site:   Days:   Peripheral IV 02/22/17 Left Other (Comment)   02/22/17    0256    Other (Comment)   less than 1          Labs/Imaging Results for orders placed or performed during the hospital encounter of 02/19/17 (from the past 48 hour(s))  Glucose, capillary     Status: Abnormal   Collection Time: 02/21/17 12:36 AM  Result Value Ref Range   Glucose-Capillary 142 (H) 65 - 99 mg/dL  Basic metabolic panel     Status: Abnormal   Collection Time: 02/21/17  2:39 AM  Result Value Ref Range   Sodium 137 135 - 145 mmol/L   Potassium 3.8 3.5 - 5.1 mmol/L   Chloride 103 101 - 111 mmol/L   CO2 19 (L) 22 - 32 mmol/L   Glucose, Bld 127 (H) 65 - 99 mg/dL   BUN 7 6 - 20 mg/dL   Creatinine, Ser 0.75 0.44 - 1.00 mg/dL   Calcium 8.7 (L) 8.9 - 10.3 mg/dL   GFR calc non Af Amer >60 >60 mL/min   GFR calc Af Amer >60 >60 mL/min    Comment: (NOTE) The eGFR has been calculated using the CKD EPI equation. This calculation has  not been validated in all clinical situations. eGFR's persistently <60 mL/min signify possible Chronic Kidney Disease.    Anion gap 15 5 - 15  CBC with Differential/Platelet     Status: Abnormal   Collection Time: 02/21/17  2:39 AM  Result Value Ref Range   WBC 14.5 (H) 4.0 - 10.5 K/uL   RBC 3.93 3.87 - 5.11 MIL/uL   Hemoglobin 11.1 (L) 12.0 - 15.0 g/dL   HCT 35.2 (L) 36.0 - 46.0 %   MCV 89.6 78.0 - 100.0 fL   MCH 28.2 26.0 - 34.0 pg   MCHC 31.5 30.0 - 36.0 g/dL   RDW 14.7 11.5 - 15.5 %   Platelets 342 150 - 400 K/uL   Neutrophils Relative % 70 %   Neutro Abs 10.2 (H) 1.7 - 7.7 K/uL   Lymphocytes Relative 22 %   Lymphs Abs 3.1 0.7 - 4.0 K/uL   Monocytes Relative 7 %   Monocytes Absolute 1.0 0.1 - 1.0 K/uL   Eosinophils Relative 1 %   Eosinophils Absolute 0.1 0.0 - 0.7 K/uL   Basophils  Relative 0 %   Basophils Absolute 0.0 0.0 - 0.1 K/uL  Troponin I     Status: None   Collection Time: 02/21/17  2:39 AM  Result Value Ref Range   Troponin I <0.03 <0.03 ng/mL  Comprehensive metabolic panel     Status: Abnormal   Collection Time: 02/22/17  4:06 AM  Result Value Ref Range   Sodium 138 135 - 145 mmol/L   Potassium 3.5 3.5 - 5.1 mmol/L   Chloride 106 101 - 111 mmol/L   CO2 22 22 - 32 mmol/L   Glucose, Bld 126 (H) 65 - 99 mg/dL   BUN 11 6 - 20 mg/dL   Creatinine, Ser 0.72 0.44 - 1.00 mg/dL   Calcium 8.6 (L) 8.9 - 10.3 mg/dL   Total Protein 7.3 6.5 - 8.1 g/dL   Albumin 4.0 3.5 - 5.0 g/dL   AST 24 15 - 41 U/L   ALT 19 14 - 54 U/L   Alkaline Phosphatase 77 38 - 126 U/L   Total Bilirubin 0.1 (L) 0.3 - 1.2 mg/dL   GFR calc non Af Amer >60 >60 mL/min   GFR calc Af Amer >60 >60 mL/min    Comment: (NOTE) The eGFR has been calculated using the CKD EPI equation. This calculation has not been validated in all clinical situations. eGFR's persistently <60 mL/min signify possible Chronic Kidney Disease.    Anion gap 10 5 - 15  Lipase, blood     Status: None   Collection Time: 02/22/17  4:06 AM  Result Value Ref Range   Lipase 25 11 - 51 U/L  CBC with Differential     Status: Abnormal   Collection Time: 02/22/17  4:06 AM  Result Value Ref Range   WBC 11.2 (H) 4.0 - 10.5 K/uL   RBC 3.88 3.87 - 5.11 MIL/uL   Hemoglobin 11.0 (L) 12.0 - 15.0 g/dL   HCT 34.8 (L) 36.0 - 46.0 %   MCV 89.7 78.0 - 100.0 fL   MCH 28.4 26.0 - 34.0 pg   MCHC 31.6 30.0 - 36.0 g/dL   RDW 14.7 11.5 - 15.5 %   Platelets 389 150 - 400 K/uL   Neutrophils Relative % 63 %   Neutro Abs 7.1 1.7 - 7.7 K/uL   Lymphocytes Relative 28 %   Lymphs Abs 3.2 0.7 - 4.0 K/uL   Monocytes Relative 8 %  Monocytes Absolute 0.9 0.1 - 1.0 K/uL   Eosinophils Relative 1 %   Eosinophils Absolute 0.1 0.0 - 0.7 K/uL   Basophils Relative 0 %   Basophils Absolute 0.0 0.0 - 0.1 K/uL  Hemoglobin A1c     Status: Abnormal    Collection Time: 02/22/17  4:09 AM  Result Value Ref Range   Hgb A1c MFr Bld 6.0 (H) 4.8 - 5.6 %    Comment: (NOTE) Pre diabetes:          5.7%-6.4% Diabetes:              >6.4% Glycemic control for   <7.0% adults with diabetes    Mean Plasma Glucose 125.5 mg/dL    Comment: Performed at [facility] [address], [place], [place] [address]  Salicylate level     Status: None   Collection Time: 02/22/17  3:35 PM  Result Value Ref Range   Salicylate Lvl <7.4 2.8 - 30.0 mg/dL  Acetaminophen level     Status: Abnormal   Collection Time: 02/22/17  3:35 PM  Result Value Ref Range   Acetaminophen (Tylenol), Serum <10 (L) 10 - 30 ug/mL    Comment:        THERAPEUTIC CONCENTRATIONS VARY SIGNIFICANTLY. A RANGE OF 10-30 ug/mL MAY BE AN EFFECTIVE CONCENTRATION FOR MANY PATIENTS. HOWEVER, SOME ARE BEST TREATED AT CONCENTRATIONS OUTSIDE THIS RANGE. ACETAMINOPHEN CONCENTRATIONS >150 ug/mL AT 4 HOURS AFTER INGESTION AND >50 ug/mL AT 12 HOURS AFTER INGESTION ARE OFTEN ASSOCIATED WITH TOXIC REACTIONS.   Magnesium     Status: None   Collection Time: 02/22/17  3:45 PM  Result Value Ref Range   Magnesium 2.0 1.7 - 2.4 mg/dL  TSH     Status: None   Collection Time: 02/22/17  3:45 PM  Result Value Ref Range   TSH 2.797 0.350 - 4.500 uIU/mL    Comment: Performed by a 3rd Generation assay with a functional sensitivity of <=0.01 uIU/mL.  Troponin I     Status: None   Collection Time: 02/22/17  3:45 PM  Result Value Ref Range   Troponin I <0.03 <0.03 ng/mL   Ct Head Wo Contrast  Result Date: 02/21/2017 CLINICAL DATA:  47 y/o  F; unwitnessed fall with head injury. EXAM: CT HEAD WITHOUT CONTRAST CT CERVICAL SPINE WITHOUT CONTRAST TECHNIQUE: Multidetector CT imaging of the head and cervical spine was performed following the standard protocol without intravenous contrast. Multiplanar CT image reconstructions of the cervical spine were also generated. COMPARISON:  07/08/2016 CT head.   11/27/2015 CT cervical spine. FINDINGS: CT HEAD FINDINGS Brain: No evidence of acute infarction, hemorrhage, hydrocephalus, extra-axial collection or mass lesion/mass effect. Stable findings of Chiari malformation post suboccipital decompression. Vascular: No hyperdense vessel or unexpected calcification. Skull: Posterior arc of C1 and suboccipital resection stable chronic postsurgical changes. No focal calvarial lesion. Sinuses/Orbits: No acute finding. Other: None. CT CERVICAL SPINE FINDINGS Alignment: Normal. Skull base and vertebrae: No acute fracture. No primary bone lesion or focal pathologic process. Soft tissues and spinal canal: No prevertebral fluid or swelling. No visible canal hematoma. Disc levels: Vertebral body and disc space heights are preserved. Minimal facet arthrosis. No significant bony foraminal or canal stenosis. Upper chest: Negative. Other: Negative. IMPRESSION: 1. No acute intracranial abnormality or calvarial fracture. 2. No acute fracture or dislocation of cervical spine. 3. 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 Electronically Signed   By: Kristine Garbe M.D.   On: 02/21/2017 02:57  Ct Cervical Spine Wo Contrast  Result Date: 02/21/2017 CLINICAL DATA:  47 y/o  F; unwitnessed fall with head injury. EXAM: CT HEAD WITHOUT CONTRAST CT CERVICAL SPINE WITHOUT CONTRAST TECHNIQUE: Multidetector CT imaging of the head and cervical spine was performed following the standard protocol without intravenous contrast. Multiplanar CT image reconstructions of the cervical spine were also generated. COMPARISON:  07/08/2016 CT head.  11/27/2015 CT cervical spine. FINDINGS: CT HEAD FINDINGS Brain: No evidence of acute infarction, hemorrhage, hydrocephalus, extra-axial collection or mass lesion/mass effect. Stable findings of Chiari malformation post suboccipital decompression. Vascular: No hyperdense vessel or unexpected calcification. Skull:  Posterior arc of C1 and suboccipital resection stable chronic postsurgical changes. No focal calvarial lesion. Sinuses/Orbits: No acute finding. Other: None. CT CERVICAL SPINE FINDINGS Alignment: Normal. Skull base and vertebrae: No acute fracture. No primary bone lesion or focal pathologic process. Soft tissues and spinal canal: No prevertebral fluid or swelling. No visible canal hematoma. Disc levels: Vertebral body and disc space heights are preserved. Minimal facet arthrosis. No significant bony foraminal or canal stenosis. Upper chest: Negative. Other: Negative. IMPRESSION: 1. No acute intracranial abnormality or calvarial fracture. 2. No acute fracture or dislocation of cervical spine. 3. 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 Electronically Signed   By: Kristine Garbe M.D.   On: 02/21/2017 02:57   Mr Brain Wo Contrast  Result Date: 02/22/2017 CLINICAL DATA:  Acute onset severe, worst headache of life. History of Chiari malformation. EXAM: MRI HEAD WITHOUT CONTRAST TECHNIQUE: Multiplanar, multiecho pulse sequences of the brain and surrounding structures were obtained without intravenous contrast. COMPARISON:  CT HEAD February 21, 2017 and MRI of the head November 15, 2015 FINDINGS: INTRACRANIAL CONTENTS: No reduced diffusion to suggest acute ischemia, hyperacute demyelination or hypercellular tumor. No susceptibility artifact to suggest hemorrhage. The ventricles and sulci are normal for patient's age. No suspicious parenchymal signal, masses, mass effect. No abnormal extra-axial fluid collections. No extra-axial masses. No cerebellar tonsillar compression, ample CSF fluid signal at craniocervical junction. VASCULAR: Normal major intracranial vascular flow voids present at skull base. SKULL AND UPPER CERVICAL SPINE: Borderline partially empty sella. Status post decompresses suboccipital craniectomy. No suspicious calvarial bone marrow signal.  Craniocervical junction maintained. SINUSES/ORBITS: The mastoid air-cells and included paranasal sinuses are well-aerated.The included ocular globes and orbital contents are non-suspicious. OTHER: None. IMPRESSION: 1. No acute intracranial process. 2. Status post suboccipital decompressive craniectomy with expected postoperative change. 3. Borderline partially empty sella. Electronically Signed   By: Elon Alas M.D.   On: 02/22/2017 19:03   Ct Abdomen Pelvis W Contrast  Result Date: 02/22/2017 CLINICAL DATA:  Left lower quadrant pain after a fall on 02/21/2017. History of hypertension, gastroesophageal reflux disease, diabetes, tubal ligations, and cholecystectomy. EXAM: CT ABDOMEN AND PELVIS WITH CONTRAST TECHNIQUE: Multidetector CT imaging of the abdomen and pelvis was performed using the standard protocol following bolus administration of intravenous contrast. CONTRAST:  100 mL Isovue-300 COMPARISON:  02/17/2017 FINDINGS: Lower chest: Atelectasis in the lung bases. Hepatobiliary: Mild diffuse fatty infiltration of the liver. No focal liver abnormality is seen. Status post cholecystectomy. No biliary dilatation. Pancreas: Unremarkable. No pancreatic ductal dilatation or surrounding inflammatory changes. Spleen: Normal in size without focal abnormality. Adrenals/Urinary Tract: Adrenal glands are unremarkable. Kidneys are normal, without renal calculi, focal lesion, or hydronephrosis. Bladder is unremarkable. Stomach/Bowel: Stomach is within normal limits. Appendix appears normal. No evidence of bowel wall thickening, distention, or inflammatory changes. Vascular/Lymphatic: No significant vascular findings are present. No enlarged abdominal  or pelvic lymph nodes. Reproductive: Uterus and bilateral adnexa are unremarkable. Other: Small periumbilical hernia containing fat. No free air or free fluid in the abdomen. Musculoskeletal: No acute or significant osseous findings. IMPRESSION: 1. Mild diffuse fatty  infiltration of the liver. 2. No evidence of bowel obstruction or inflammation. Appendix is normal. 3. Minimal periumbilical hernia containing fat. Electronically Signed   By: Lucienne Capers M.D.   On: 02/22/2017 04:34    Pending Labs Unresulted Labs (From admission, onward)   Start     Ordered   02/23/17 0500  Comprehensive metabolic panel  Tomorrow morning,   R     02/22/17 1444   02/22/17 1444  Troponin I  Now then every 6 hours,   R     02/22/17 1444   02/22/17 1442  HIV antibody (Routine Testing)  Once,   R     02/22/17 1444   02/22/17 0214  Urinalysis, Routine w reflex microscopic  STAT,   STAT     02/22/17 0214      Vitals/Pain Today's Vitals   02/22/17 1630 02/22/17 1700 02/22/17 1730 02/22/17 1800  BP: 112/69 113/69 110/77 134/81  Pulse: 93 90 91 90  Resp: _0 Temp:      TempSrc:      SpO2: 98% 97% 96% 97%  Weight:      Height:      PainSc:        Isolation Precautions No active isolations  Medications Medications  acetaminophen (TYLENOL) tablet 650 mg (650 mg Oral Given 02/21/17 1132)  alum & mag hydroxide-simeth (MAALOX/MYLANTA) 200-200-20 MG/5ML suspension 30 mL (30 mLs Oral Given 02/20/17 1856)  hydrOXYzine (ATARAX/VISTARIL) tablet 25 mg (25 mg Oral Given 02/20/17 2327)  magnesium hydroxide (MILK OF MAGNESIA) suspension 30 mL (not administered)  traZODone (DESYREL) tablet 50 mg (50 mg Oral Given 02/22/17 0115)  nitrofurantoin (macrocrystal-monohydrate) (MACROBID) capsule 100 mg (100 mg Oral Given 02/22/17 1524)  ARIPiprazole (ABILIFY) tablet 5 mg (5 mg Oral Given 02/22/17 1531)  mirtazapine (REMERON) tablet 15 mg (15 mg Oral Given 02/22/17 0115)  labetalol (NORMODYNE,TRANDATE) injection 20 mg (20 mg Intravenous Given 02/22/17 1134)  metoprolol tartrate (LOPRESSOR) tablet 50 mg (not administered)  clonazePAM (KLONOPIN) tablet 0.5 mg (0.5 mg Oral Given 02/22/17 1524)  gabapentin (NEURONTIN) capsule 400 mg (not administered)  prazosin (MINIPRESS) capsule 4  mg (not administered)  cloNIDine (CATAPRES) tablet 0.1 mg (not administered)  diltiazem (CARDIZEM CD) 24 hr capsule 240 mg (not administered)  divalproex (DEPAKOTE) DR tablet 500 mg (not administered)  losartan (COZAAR) tablet 100 mg (not administered)  pantoprazole (PROTONIX) EC tablet 40 mg (not administered)  pravastatin (PRAVACHOL) tablet 20 mg (not administered)  QUEtiapine (SEROQUEL) tablet 300 mg (not administered)  famotidine (PEPCID) tablet 20 mg (not administered)  tiZANidine (ZANAFLEX) tablet 2 mg (not administered)  topiramate (TOPAMAX) tablet 50 mg (not administered)  enoxaparin (LOVENOX) injection 40 mg (not administered)  acetaminophen (TYLENOL) tablet 650 mg (not administered)    Or  acetaminophen (TYLENOL) suppository 650 mg (not administered)  furosemide (LASIX) injection 40 mg (not administered)  insulin aspart (novoLOG) injection 0-9 Units (not administered)  aspirin 81 MG chewable tablet (324 mg  Given 02/19/17 2300)  acetaminophen (TYLENOL) tablet 650 mg (650 mg Oral Given 02/20/17 0604)  acetaminophen (TYLENOL) tablet 650 mg (650 mg Oral Given 02/21/17 0306)  ondansetron (ZOFRAN) injection 4 mg (4 mg Intramuscular Given 02/21/17 1737)  ondansetron (ZOFRAN) 4 MG/2ML injection (  Duplicate 9/52/84 1324)  sodium  chloride 0.9 % bolus 1,000 mL (0 mLs Intravenous Stopped 02/22/17 0824)  sodium chloride 0.9 % bolus 500 mL (0 mLs Intravenous Stopped 02/22/17 0645)  metoCLOPramide (REGLAN) injection 10 mg (10 mg Intravenous Given 02/22/17 0258)  diphenhydrAMINE (BENADRYL) injection 25 mg (25 mg Intravenous Given 02/22/17 0258)  iopamidol (ISOVUE-300) 61 % injection 100 mL (100 mLs Intravenous Contrast Given 02/22/17 0417)  magnesium sulfate IVPB 2 g 50 mL (0 g Intravenous Stopped 02/22/17 0701)  cloNIDine (CATAPRES) tablet 0.2 mg (0.2 mg Oral Given 02/22/17 1134)    Mobility walks

## 2017-02-22 NOTE — ED Notes (Signed)
Pt here from [facility] to be seen for her abdominal pain and she fell yesterday and still complains of a headache
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
## 2017-02-22 NOTE — ED Notes (Signed)
Pt placed on 3L [place] due to O2 dropping when pt sleeps. Pt stated that she has O2 for when she sleeps. She was told to wear 3 L [place] at bedtime.

## 2017-02-22 NOTE — H&P (Deleted)
  The note originally documented on this encounter has been moved the the encounter in which it belongs.  

## 2017-02-23 ENCOUNTER — Other Ambulatory Visit (HOSPITAL_COMMUNITY): Payer: Self-pay

## 2017-02-23 DIAGNOSIS — I160 Hypertensive urgency: Principal | ICD-10-CM

## 2017-02-23 LAB — VALPROIC ACID LEVEL: Valproic Acid Lvl: <10 ug/mL — ABNORMAL LOW (ref 50.0–100.0)

## 2017-02-23 LAB — GLUCOSE, CAPILLARY
GLUCOSE-CAPILLARY: 130 mg/dL — AB (ref 65–99)
Glucose-Capillary: 163 mg/dL — ABNORMAL HIGH (ref 65–99)
Glucose-Capillary: 121 mg/dL — ABNORMAL HIGH (ref 65–99)

## 2017-02-23 LAB — HIV ANTIBODY (ROUTINE TESTING W REFLEX): HIV Screen 4th Generation wRfx: NONREACTIVE

## 2017-02-23 LAB — MAGNESIUM: Magnesium: 2.1 mg/dL (ref 1.7–2.4)

## 2017-02-23 LAB — COMPREHENSIVE METABOLIC PANEL
ALT: 19 U/L (ref 14–54)
AST: 24 U/L (ref 15–41)
Albumin: 3.8 g/dL (ref 3.5–5.0)
Alkaline Phosphatase: 70 U/L (ref 38–126)
Anion gap: 11 (ref 5–15)
BUN: 17 mg/dL (ref 6–20)
CO2: 26 mmol/L (ref 22–32)
Calcium: 8.9 mg/dL (ref 8.9–10.3)
Chloride: 103 mmol/L (ref 101–111)
Creatinine, Ser: 0.9 mg/dL (ref 0.44–1.00)
GFR calc Af Amer: >60 mL/min (ref >60)
GFR calc non Af Amer: >60 mL/min (ref >60)
Glucose, Bld: 137 mg/dL — ABNORMAL HIGH (ref 65–99)
Potassium: 3.5 mmol/L (ref 3.5–5.1)
Sodium: 140 mmol/L (ref 135–145)
Total Bilirubin: 0.4 mg/dL (ref 0.3–1.2)
Total Protein: 6.8 g/dL (ref 6.5–8.1)

## 2017-02-23 LAB — CBC
HCT: 34 % — ABNORMAL LOW (ref 36.0–46.0)
Hemoglobin: 10.6 g/dL — ABNORMAL LOW (ref 12.0–15.0)
MCH: 28 pg (ref 26.0–34.0)
MCHC: 31.2 g/dL (ref 30.0–36.0)
MCV: 89.7 fL (ref 78.0–100.0)
Platelets: 392 10*3/uL (ref 150–400)
RBC: 3.79 MIL/uL — ABNORMAL LOW (ref 3.87–5.11)
RDW: 14.8 % (ref 11.5–15.5)
WBC: 10.7 10*3/uL — ABNORMAL HIGH (ref 4.0–10.5)

## 2017-02-23 MED ORDER — TOPIRAMATE 100 MG PO TABS
100.0000 mg | ORAL_TABLET | Freq: Two times a day (BID) | ORAL | Status: DC
  Administered 2017-02-23 – 2017-02-26 (×6): 100 mg via ORAL
  Filled 2017-02-23 (×6): qty 1

## 2017-02-23 MED ORDER — SUMATRIPTAN SUCCINATE 50 MG PO TABS
100.0000 mg | ORAL_TABLET | ORAL | Status: DC | PRN
  Filled 2017-02-23: qty 2

## 2017-02-23 MED ORDER — DIVALPROEX SODIUM 250 MG PO DR TAB
500.0000 mg | DELAYED_RELEASE_TABLET | Freq: Two times a day (BID) | ORAL | Status: DC
  Administered 2017-02-23 – 2017-02-26 (×6): 500 mg via ORAL
  Filled 2017-02-23 (×6): qty 2

## 2017-02-23 MED ORDER — ONDANSETRON HCL 4 MG/2ML IJ SOLN
4.0000 mg | Freq: Four times a day (QID) | INTRAMUSCULAR | Status: DC | PRN
  Administered 2017-02-23 – 2017-02-25 (×3): 4 mg via INTRAVENOUS
  Filled 2017-02-23 (×3): qty 2

## 2017-02-23 MED ORDER — SODIUM CHLORIDE 0.9 % IV BOLUS (SEPSIS)
500.0000 mL | Freq: Once | INTRAVENOUS | Status: AC
  Administered 2017-02-23: 500 mL via INTRAVENOUS

## 2017-02-23 MED ORDER — SODIUM CHLORIDE 0.9 % IV SOLN
Freq: Once | INTRAVENOUS | Status: AC
  Administered 2017-02-23: 17:00:00 via INTRAVENOUS

## 2017-02-23 MED ORDER — ENOXAPARIN SODIUM 40 MG/0.4ML ~~LOC~~ SOLN
40.0000 mg | SUBCUTANEOUS | Status: DC
  Administered 2017-02-23 – 2017-02-25 (×3): 40 mg via SUBCUTANEOUS
  Filled 2017-02-23 (×3): qty 0.4

## 2017-02-23 MED ORDER — METFORMIN HCL 500 MG PO TABS
1000.0000 mg | ORAL_TABLET | Freq: Two times a day (BID) | ORAL | Status: DC
  Administered 2017-02-23 – 2017-02-26 (×6): 1000 mg via ORAL
  Filled 2017-02-23 (×6): qty 2
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
## 2017-02-23 NOTE — Care Management Note (Signed)
Case Management Note  Patient Details  Name: Meredith Cherry MRN: [id number] Date of Birth: May 24, 1970  Subjective/Objective:  47 y/o f admitted w/uncontrolled htn. From [facility].                  Action/Plan:d/c plan back to IP Psych.   Expected Discharge Date:                  [facility]  [facility]  In-House Referral:  Clinical Social Work  Discharge planning Services  CM Consult  Post Acute Care Choice:    Choice offered to:     DME Arranged:    DME Agency:     HH Arranged:    [place] Agency:     Status of Service:  In process, will continue to follow  If discussed at Long Length of Stay Meetings, dates discussed:    Additional Comments:  Dessa Phi, RN 02/23/2017, 12:31 PM

## 2017-02-23 NOTE — Care Management Note (Signed)
Case Management Note  Patient Details  Name: NASHAY BRICKLEY MRN: [id number] Date of Birth: 11/19/1970  Subjective/Objective:  47 y/o f admitted w/Uncontrolled HTN. From home.                  Action/Plan:d/c plan home.   Expected Discharge Date:                  Expected Discharge Plan:  Home/Self Care  In-House Referral:     Discharge planning Services  CM Consult  Post Acute Care Choice:    Choice offered to:     DME Arranged:    DME Agency:     HH Arranged:    HH Agency:     Status of Service:  In process, will continue to follow  If discussed at Long Length of Stay Meetings, dates discussed:    Additional Comments:  Dessa Phi, RN 02/23/2017, 11:40 AM

## 2017-02-23 NOTE — Progress Notes (Signed)
Paged MD about a continuing inability to void, MD ordered bolus of 285mL/2hours.

## 2017-02-23 NOTE — Progress Notes (Signed)
Pt has not voided this shift.  Last documented void around 2300 last night.  Pt states she did void in the early morning hours. Lower abd/suprapubic area soft, no distention.  Pt denies urge to void.  Bladder scan (multiple scans) show 60ml in bladder.  This Probation officer has given pt 2 ginger ales today and bedside sitter states pt has had several more with her meals.  Dr Rodena Piety aware.  Order for 514ml NS over 5 hours.

## 2017-02-23 NOTE — Progress Notes (Signed)
   02/23/17 1358  Clinical Encounter Type  Visited With Health care provider;Patient  Visit Type Initial;Spiritual support  Referral From Nurse  Consult/Referral To Chaplain   Following up on a [facility] consult.  Patient was asleep and sitter said she had been for a while and sleeping a good part of the day.  Will follow as needed. Chaplain Katherene Ponto

## 2017-02-23 NOTE — Progress Notes (Signed)
Pt ambulating in hall w/ RW and 1:1 sitter.  O2 sat 93% on RA with ambulation.

## 2017-02-23 NOTE — Plan of Care (Signed)
  Progressing Education: Knowledge of General Education information will improve 02/23/2017 0955 - Progressing by Kerrin Mo, RN Health Behavior/Discharge Planning: Ability to manage health-related needs will improve 02/23/2017 0955 - Progressing by Kerrin Mo, RN Clinical Measurements: Ability to maintain clinical measurements within normal limits will improve 02/23/2017 0955 - Progressing by Kerrin Mo, RN Note BP improving.  Diagnostic test results will improve 02/23/2017 0955 - Progressing by Kerrin Mo, RN Safety: Ability to remain free from injury will improve 02/23/2017 0955 - Progressing by Kerrin Mo, RN Skin Integrity: Risk for impaired skin integrity will decrease 02/23/2017 0955 - Progressing by Kerrin Mo, RN

## 2017-02-23 NOTE — Progress Notes (Signed)
Inpatient Diabetes Program Recommendations  AACE/ADA: New Consensus Statement on Inpatient Glycemic Control (2015)  Target Ranges:  Prepandial:   less than 140 mg/dL      Peak postprandial:   less than 180 mg/dL (1-2 hours)      Critically ill patients:  140 - 180 mg/dL   Lab Results  Component Value Date   GLUCAP 130 (H) 02/23/2017   HGBA1C 6.0 (H) 02/22/2017    Review of Glycemic Control  Diabetes history: DM2 Outpatient Diabetes medications: metformin 1000 mg bid Current orders for Inpatient glycemic control: Novolog 0-9 units tidwc, metformin 1000 mg bid  HgbA1C - 6.0% Good control at home.  Pt requested to speak with Diabetes Coordinator regarding taking insulin here in hospital. Explained to pt that we are giving her a correction insulin to keep her blood sugars between 140-180 mg/dL while inpatient. Pt states she has had poor appetite for past couple of days, but it has improved today.  Recommended pt continue to check her blood sugars at home and f/u with her PCP for diabetes management.   Thank you. Lorenda Peck, RD, LDN, CDE Inpatient Diabetes Coordinator [phone]

## 2017-02-24 LAB — COMPREHENSIVE METABOLIC PANEL
ALT: 19 U/L (ref 14–54)
AST: 27 U/L (ref 15–41)
Albumin: 3.4 g/dL — ABNORMAL LOW (ref 3.5–5.0)
Alkaline Phosphatase: 67 U/L (ref 38–126)
Anion gap: 12 (ref 5–15)
BUN: 24 mg/dL — ABNORMAL HIGH (ref 6–20)
CO2: 22 mmol/L (ref 22–32)
Calcium: 8.4 mg/dL — ABNORMAL LOW (ref 8.9–10.3)
Chloride: 103 mmol/L (ref 101–111)
Creatinine, Ser: 0.96 mg/dL (ref 0.44–1.00)
GFR calc Af Amer: >60 mL/min (ref >60)
GFR calc non Af Amer: >60 mL/min (ref >60)
Glucose, Bld: 102 mg/dL — ABNORMAL HIGH (ref 65–99)
Potassium: 3.4 mmol/L — ABNORMAL LOW (ref 3.5–5.1)
Sodium: 137 mmol/L (ref 135–145)
Total Bilirubin: 0.2 mg/dL — ABNORMAL LOW (ref 0.3–1.2)
Total Protein: 6.3 g/dL — ABNORMAL LOW (ref 6.5–8.1)

## 2017-02-24 LAB — CBC
HCT: 30.5 % — ABNORMAL LOW (ref 36.0–46.0)
Hemoglobin: 9.7 g/dL — ABNORMAL LOW (ref 12.0–15.0)
MCH: 29 pg (ref 26.0–34.0)
MCHC: 31.8 g/dL (ref 30.0–36.0)
MCV: 91 fL (ref 78.0–100.0)
Platelets: 367 10*3/uL (ref 150–400)
RBC: 3.35 MIL/uL — ABNORMAL LOW (ref 3.87–5.11)
RDW: 15 % (ref 11.5–15.5)
WBC: 9.9 10*3/uL (ref 4.0–10.5)

## 2017-02-24 MED ORDER — ARIPIPRAZOLE 5 MG PO TABS: 5.0000 mg | ORAL_TABLET | Freq: Every day | ORAL | 0 refills | Status: DC

## 2017-02-24 MED ORDER — MIRTAZAPINE 15 MG PO TABS: 15.0000 mg | ORAL_TABLET | Freq: Every day | ORAL | 0 refills | Status: DC

## 2017-02-24 MED ORDER — DILTIAZEM HCL ER COATED BEADS 240 MG PO CP24: 240.0000 mg | ORAL_CAPSULE | Freq: Every day | ORAL | 0 refills | Status: DC

## 2017-02-24 MED ORDER — METOPROLOL TARTRATE 50 MG PO TABS: 50.0000 mg | ORAL_TABLET | Freq: Two times a day (BID) | ORAL | 0 refills | Status: DC

## 2017-02-24 MED ORDER — LOSARTAN POTASSIUM 100 MG PO TABS: 50.0000 mg | ORAL_TABLET | Freq: Every day | ORAL | 0 refills | Status: DC

## 2017-02-24 NOTE — Progress Notes (Signed)
Paged on call MD an update that patient has voided 1000+cc amber colored urine.
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
## 2017-02-24 NOTE — Progress Notes (Addendum)
CSW received a call from pt's RN stating pt was ready for nearly ready for D/C and that pt was previously from [facility] and, per notes, was to return.  CSW called and spoke to [place] at [facility] who stated pt no longer has the same bed but that the pt is now back on the waiting list for a different bed at [facility].   CSW awaiting return call from [facility] regarding bed status bit it is unlikely a bed will be available today (2/2).  CSW called [facility] ut was told they currently have a waiting list to send people from their [place] (ED) to the [facility] ([place]).  CSW will update RN.  [facility] CSW covering for [facility] today will continue to follow for D/C needs.  Meredith Guild. Maximiliano Cromartie, LCSW, LCAS, CSI Clinical Social Worker Ph: [phone]

## 2017-02-24 NOTE — Plan of Care (Signed)
  Adequate for Discharge Health Behavior/Discharge Planning: Ability to manage health-related needs will improve 02/24/2017 1003 - Adequate for Discharge by Kerrin Mo, RN Safety: Ability to remain free from injury will improve 02/24/2017 1003 - Adequate for Discharge by Kerrin Mo, RN

## 2017-02-25 LAB — URINALYSIS, ROUTINE W REFLEX MICROSCOPIC
BILIRUBIN URINE: NEGATIVE
GLUCOSE, UA: NEGATIVE mg/dL
HGB URINE DIPSTICK: NEGATIVE
Ketones, ur: 20 mg/dL — AB
NITRITE: NEGATIVE
Protein, ur: NEGATIVE mg/dL
SPECIFIC GRAVITY, URINE: 1.019 (ref 1.005–1.030)
pH: 7 (ref 5.0–8.0)

## 2017-02-25 NOTE — Progress Notes (Signed)
Per attending pt medically stable for DC and per psychiatry needs to receive inpatient treatment (was admitted at [facility] for psychiatric treatment prior to medical admission). Spoke with [facility], no beds available on appropriate unit today. Will refer pt to other hospitals as well.  Sharren Bridge, MSW, LCSW Clinical Social Work 02/25/2017 [phone] Weekend coverage
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
## 2017-02-26 ENCOUNTER — Inpatient Hospital Stay: Payer: Medicare PPO

## 2017-02-26 ENCOUNTER — Inpatient Hospital Stay
Admission: AD | Admit: 2017-02-26 | Discharge: 2017-03-02 | DRG: 882 | Disposition: A | Discharge status: Home / Self Care | Payer: Medicare PPO | Source: Intra-hospital | Attending: Psychiatry | Admitting: Psychiatry

## 2017-02-26 ENCOUNTER — Other Ambulatory Visit: Payer: Self-pay

## 2017-02-26 DIAGNOSIS — G4700 Insomnia, unspecified: Secondary | ICD-10-CM | POA: Diagnosis present

## 2017-02-26 DIAGNOSIS — E039 Hypothyroidism, unspecified: Secondary | ICD-10-CM | POA: Diagnosis present

## 2017-02-26 DIAGNOSIS — Z881 Allergy status to other antibiotic agents status: Secondary | ICD-10-CM

## 2017-02-26 DIAGNOSIS — K219 Gastro-esophageal reflux disease without esophagitis: Secondary | ICD-10-CM | POA: Diagnosis present

## 2017-02-26 DIAGNOSIS — Z7984 Long term (current) use of oral hypoglycemic drugs: Secondary | ICD-10-CM | POA: Diagnosis not present

## 2017-02-26 DIAGNOSIS — Z62810 Personal history of physical and sexual abuse in childhood: Secondary | ICD-10-CM | POA: Diagnosis present

## 2017-02-26 DIAGNOSIS — Z87891 Personal history of nicotine dependence: Secondary | ICD-10-CM

## 2017-02-26 DIAGNOSIS — F603 Borderline personality disorder: Secondary | ICD-10-CM | POA: Diagnosis present

## 2017-02-26 DIAGNOSIS — G43909 Migraine, unspecified, not intractable, without status migrainosus: Secondary | ICD-10-CM

## 2017-02-26 DIAGNOSIS — N390 Urinary tract infection, site not specified: Secondary | ICD-10-CM | POA: Diagnosis present

## 2017-02-26 DIAGNOSIS — F909 Attention-deficit hyperactivity disorder, unspecified type: Secondary | ICD-10-CM | POA: Diagnosis present

## 2017-02-26 DIAGNOSIS — F332 Major depressive disorder, recurrent severe without psychotic features: Secondary | ICD-10-CM | POA: Diagnosis present

## 2017-02-26 DIAGNOSIS — F515 Nightmare disorder: Secondary | ICD-10-CM | POA: Diagnosis present

## 2017-02-26 DIAGNOSIS — J45909 Unspecified asthma, uncomplicated: Secondary | ICD-10-CM | POA: Diagnosis present

## 2017-02-26 DIAGNOSIS — Z79899 Other long term (current) drug therapy: Secondary | ICD-10-CM | POA: Diagnosis not present

## 2017-02-26 DIAGNOSIS — Z880 Allergy status to penicillin: Secondary | ICD-10-CM

## 2017-02-26 DIAGNOSIS — F419 Anxiety disorder, unspecified: Secondary | ICD-10-CM | POA: Diagnosis present

## 2017-02-26 DIAGNOSIS — R2681 Unsteadiness on feet: Secondary | ICD-10-CM | POA: Diagnosis present

## 2017-02-26 DIAGNOSIS — F4310 Post-traumatic stress disorder, unspecified: Secondary | ICD-10-CM | POA: Diagnosis present

## 2017-02-26 DIAGNOSIS — R9431 Abnormal electrocardiogram [ECG] [EKG]: Secondary | ICD-10-CM | POA: Diagnosis present

## 2017-02-26 DIAGNOSIS — I10 Essential (primary) hypertension: Secondary | ICD-10-CM | POA: Diagnosis present

## 2017-02-26 DIAGNOSIS — E119 Type 2 diabetes mellitus without complications: Secondary | ICD-10-CM

## 2017-02-26 DIAGNOSIS — Z882 Allergy status to sulfonamides status: Secondary | ICD-10-CM | POA: Diagnosis not present

## 2017-02-26 DIAGNOSIS — Z915 Personal history of self-harm: Secondary | ICD-10-CM

## 2017-02-26 DIAGNOSIS — I160 Hypertensive urgency: Secondary | ICD-10-CM | POA: Diagnosis present

## 2017-02-26 HISTORY — DX: Migraine, unspecified, not intractable, without status migrainosus: G43.909

## 2017-02-26 HISTORY — DX: Type 2 diabetes mellitus without complications: E11.9

## 2017-02-26 HISTORY — DX: Major depressive disorder, recurrent severe without psychotic features: F33.2

## 2017-02-26 LAB — GLUCOSE, CAPILLARY
GLUCOSE-CAPILLARY: 114 mg/dL — AB (ref 65–99)
GLUCOSE-CAPILLARY: 130 mg/dL — AB (ref 65–99)
GLUCOSE-CAPILLARY: 121 mg/dL — AB (ref 65–99)
GLUCOSE-CAPILLARY: 77 mg/dL (ref 65–99)
Glucose-Capillary: 87 mg/dL (ref 65–99)
Glucose-Capillary: 100 mg/dL — ABNORMAL HIGH (ref 65–99)
Glucose-Capillary: 110 mg/dL — ABNORMAL HIGH (ref 65–99)
Glucose-Capillary: 119 mg/dL — ABNORMAL HIGH (ref 65–99)
Glucose-Capillary: 111 mg/dL — ABNORMAL HIGH (ref 65–99)
Glucose-Capillary: 117 mg/dL — ABNORMAL HIGH (ref 65–99)
Glucose-Capillary: 162 mg/dL — ABNORMAL HIGH (ref 65–99)

## 2017-02-26 MED ORDER — TRAZODONE HCL 100 MG PO TABS
100.0000 mg | ORAL_TABLET | Freq: Every day | ORAL | Status: DC
  Administered 2017-02-26: 100 mg via ORAL
  Filled 2017-02-26: qty 1

## 2017-02-26 MED ORDER — DIVALPROEX SODIUM 500 MG PO DR TAB
500.0000 mg | DELAYED_RELEASE_TABLET | Freq: Two times a day (BID) | ORAL | Status: DC
  Administered 2017-02-26 – 2017-03-02 (×8): 500 mg via ORAL
  Filled 2017-02-26 (×8): qty 1

## 2017-02-26 MED ORDER — DILTIAZEM HCL ER COATED BEADS 240 MG PO CP24
240.0000 mg | ORAL_CAPSULE | Freq: Every day | ORAL | Status: DC
  Administered 2017-02-27: 240 mg via ORAL
  Filled 2017-02-26 (×2): qty 1

## 2017-02-26 MED ORDER — ALUM & MAG HYDROXIDE-SIMETH 200-200-20 MG/5ML PO SUSP: 30.0000 mL | ORAL | Status: DC | PRN

## 2017-02-26 MED ORDER — ASPIRIN EC 81 MG PO TBEC
81.0000 mg | DELAYED_RELEASE_TABLET | Freq: Every day | ORAL | Status: DC
  Administered 2017-02-26 – 2017-03-02 (×5): 81 mg via ORAL
  Filled 2017-02-26 (×5): qty 1

## 2017-02-26 MED ORDER — CLONAZEPAM 0.5 MG PO TABS
0.5000 mg | ORAL_TABLET | Freq: Three times a day (TID) | ORAL | Status: DC
  Administered 2017-02-26: 0.5 mg via ORAL
  Filled 2017-02-26: qty 1

## 2017-02-26 MED ORDER — INSULIN ASPART 100 UNIT/ML ~~LOC~~ SOLN
0.0000 [IU] | Freq: Every day | SUBCUTANEOUS | Status: DC
Start: 2017-02-26 — End: 2017-03-02

## 2017-02-26 MED ORDER — CLONIDINE HCL 0.1 MG PO TABS
0.1000 mg | ORAL_TABLET | Freq: Two times a day (BID) | ORAL | Status: DC
  Administered 2017-02-27: 0.1 mg via ORAL

## 2017-02-26 MED ORDER — MAGNESIUM HYDROXIDE 400 MG/5ML PO SUSP: 30.0000 mL | Freq: Every day | ORAL | Status: DC | PRN

## 2017-02-26 MED ORDER — CLONIDINE HCL 0.1 MG PO TABS
0.1000 mg | ORAL_TABLET | Freq: Three times a day (TID) | ORAL | Status: DC
  Filled 2017-02-26: qty 1

## 2017-02-26 MED ORDER — METOPROLOL TARTRATE 25 MG PO TABS
50.0000 mg | ORAL_TABLET | Freq: Two times a day (BID) | ORAL | Status: DC
  Administered 2017-02-27: 50 mg via ORAL
  Filled 2017-02-26: qty 2

## 2017-02-26 MED ORDER — ACETAMINOPHEN 325 MG PO TABS
650.0000 mg | ORAL_TABLET | Freq: Four times a day (QID) | ORAL | Status: DC | PRN
  Administered 2017-02-28: 650 mg via ORAL
  Filled 2017-02-26: qty 2

## 2017-02-26 MED ORDER — LOSARTAN POTASSIUM 50 MG PO TABS
50.0000 mg | ORAL_TABLET | Freq: Every day | ORAL | Status: DC
  Administered 2017-02-27: 50 mg via ORAL
  Filled 2017-02-26 (×2): qty 1

## 2017-02-26 MED ORDER — SUMATRIPTAN SUCCINATE 50 MG PO TABS
100.0000 mg | ORAL_TABLET | ORAL | Status: DC | PRN
  Administered 2017-02-28 – 2017-03-02 (×3): 100 mg via ORAL
  Filled 2017-02-26 (×3): qty 2

## 2017-02-26 MED ORDER — PRAVASTATIN SODIUM 20 MG PO TABS
20.0000 mg | ORAL_TABLET | Freq: Every day | ORAL | Status: DC
  Administered 2017-02-26 – 2017-02-28 (×3): 20 mg via ORAL
  Filled 2017-02-26 (×6): qty 1

## 2017-02-26 MED ORDER — PRAZOSIN HCL 2 MG PO CAPS: 2.0000 mg | ORAL_CAPSULE | Freq: Every day | ORAL | Status: DC

## 2017-02-26 MED ORDER — INSULIN ASPART 100 UNIT/ML ~~LOC~~ SOLN
0.0000 [IU] | Freq: Three times a day (TID) | SUBCUTANEOUS | Status: DC
Start: 2017-02-26 — End: 2017-03-02
  Administered 2017-02-28 – 2017-03-02 (×5): 1 [IU] via SUBCUTANEOUS
  Filled 2017-02-26 (×4): qty 1

## 2017-02-26 MED ORDER — METFORMIN HCL 500 MG PO TABS
1000.0000 mg | ORAL_TABLET | Freq: Two times a day (BID) | ORAL | Status: DC
  Administered 2017-02-26 – 2017-03-02 (×8): 1000 mg via ORAL
  Filled 2017-02-26 (×8): qty 2

## 2017-02-26 MED ORDER — TOPIRAMATE 100 MG PO TABS
100.0000 mg | ORAL_TABLET | Freq: Every day | ORAL | Status: DC
  Administered 2017-02-26 – 2017-02-27 (×2): 100 mg via ORAL
  Filled 2017-02-26 (×2): qty 1

## 2017-02-26 NOTE — BH Assessment (Signed)
Patient has been accepted to [facility].  Patient assigned to room [place] is Dr. Bary Leriche Attending physician is Dr. Wonda Olds.  Call report to [phone].  Representative was [employer].       Patient's Family/Support System Tomia Enlow -husband, [phone]) have been updated as well.

## 2017-02-26 NOTE — Progress Notes (Signed)
Patient was walking from the medication room to her room.Since patient had multiple falls in the past and BP was 97/54 Probation officer offered wheelchair or walker.Patient states "I am fine I can walk."Writer was walking along with the patient.Patient was walking slowly,suddenlty stop  fell backward and hit her hip and head.Patient was not responding any verbal or tactile stimuli.vital signs stable.BS 136.Paient had dinner and a full bottle of [place] before fall.Called Rapid response.Put patient back on the bed.Slowly patient opened her eyes stated "I am sorry".Patient was moved to her room.Made comfortable in bed.Fall precautions explained to patient and verbalized understanding.

## 2017-02-26 NOTE — Progress Notes (Signed)
Report called to Barry at [facility]. P[facility]HAM transporting patient now.

## 2017-02-26 NOTE — Care Management Note (Signed)
Case Management Note  Patient Details  Name: Meredith Cherry MRN: [id number] Date of Birth: 1970-08-26  Subjective/Objective:                    Action/Plan:d/c IP Psych   Expected Discharge Date:  02/25/17               [facility]  In-House Referral:  Clinical Social Work  Discharge planning Services  CM Consult  Post Acute Care Choice:    Choice offered to:     DME Arranged:    DME Agency:     HH Arranged:    Capac Agency:     Status of Service:  Completed, signed off  If discussed at [employer], dates discussed:    Additional Comments:  Dessa Phi, RN 02/26/2017, 9:54 AM

## 2017-02-26 NOTE — Progress Notes (Signed)
LCSW following for facility placement.   Patient was admitted from [facility].  Psych recommends patient return to inpatient at dc.   Patient has bed at [facility].  Room 307  Attending MD: McNew  Patient will transport by Betsy Pries.   RN report #: [phone]  Servando Snare, Shawna [place] [phone]

## 2017-02-26 NOTE — Care Management Important Message (Signed)
Important Message  Patient Details  Name: Meredith Cherry MRN: [id number] Date of Birth: 1970/05/28   Medicare Important Message Given:  Yes    Kerin Salen 02/26/2017, 12:47 [place] Message  Patient Details  Name: Meredith Cherry MRN: [id number] Date of Birth: 06-10-1970   Medicare Important Message Given:  Yes    Kerin Salen 02/26/2017, 12:47 PM

## 2017-02-26 NOTE — Progress Notes (Signed)
LCSW following for inpatient psych.  LCSW notified patient's spouse of patients transfer.   Carolin Coy [place]

## 2017-02-26 NOTE — Plan of Care (Signed)
  Progressing Education: Knowledge of [facility] Education information/materials will improve 02/26/2017 2254 - Progressing by Corinna Capra, RN Emotional status will improve 02/26/2017 2254 - Progressing by Corinna Capra, RN Mental status will improve 02/26/2017 2254 - Progressing by Corinna Capra, RN Verbalization of understanding the information provided will improve 02/26/2017 2254 - Progressing by Corinna Capra, RN Activity: Interest or engagement in leisure activities will improve 02/26/2017 2254 - Progressing by Corinna Capra, RN Imbalance in normal sleep/wake cycle will improve 02/26/2017 2254 - Progressing by Corinna Capra, RN Education: Utilization of techniques to improve thought processes will improve 02/26/2017 2254 - Progressing by Corinna Capra, RN Knowledge of the prescribed therapeutic regimen will improve 02/26/2017 2254 - Progressing by Corinna Capra, RN Coping: Ability to cope will improve 02/26/2017 2254 - Progressing by Corinna Capra, RN Ability to verbalize feelings will improve 02/26/2017 2254 - Progressing by Corinna Capra, RN Health Behavior/Discharge Planning: Ability to make decisions will improve 02/26/2017 2254 - Progressing by Renato Battles A, RN Compliance with therapeutic regimen will improve 02/26/2017 2254 - Progressing by Corinna Capra, RN Role Relationship: Ability to demonstrate positive changes in social behaviors and relationships will improve 02/26/2017 2254 - Progressing by Corinna Capra, RN Safety: Ability to disclose and discuss suicidal ideas will improve 02/26/2017 2254 - Progressing by Corinna Capra, RN Ability to identify and utilize support systems that promote safety will improve 02/26/2017 2254 - Progressing by Corinna Capra, RN Self-Concept: Ability to verbalize positive feelings about self will improve 02/26/2017 2254 - Progressing by Corinna Capra, RN Level of anxiety will decrease 02/26/2017 2254 - Progressing by  Corinna Capra, RN

## 2017-02-26 NOTE — Progress Notes (Signed)
Attempted to call report no answer left voicemail to return my call for report. P[facility] here to transport patient now.

## 2017-02-26 NOTE — Progress Notes (Signed)
Patient sad  And tearful but cooperative during admission assessment. Patient verbalized that the voices telling her to hurt herself.Patient contracts for safety. Patient informed of fall risk status, fall risk assessed "moderate" at this time. Patient oriented to unit/staff/room. Patient denies any questions/concerns at this time. Patient safe on unit with Q15 minute checks for safety.Skin assessment and body search done.No contraband found.

## 2017-02-26 NOTE — Progress Notes (Signed)
   02/26/17 1400  Clinical Encounter Type  Visited With Patient;Other (Comment) Freight forwarder)  Visit Type Follow-up  Spiritual Encounters  Spiritual Needs Emotional  Stress Factors  Patient Stress Factors (expressed worried about going to [facility] for care.  )   Followed up on [facility] from Friday.  Patient was alert.  Sitter indicated she was upset.  In asking why, she responded she was hoping to go to [facility] but is going to [facility].  She shared about a past experience that upset her.  She was emotional and sad.  She felt bad about being upset.  Will follow as needed. Chaplain Katherene Ponto
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
## 2017-02-27 DIAGNOSIS — F603 Borderline personality disorder: Secondary | ICD-10-CM

## 2017-02-27 DIAGNOSIS — F4310 Post-traumatic stress disorder, unspecified: Secondary | ICD-10-CM

## 2017-02-27 HISTORY — DX: Borderline personality disorder: F60.3

## 2017-02-27 LAB — URINALYSIS, COMPLETE (UACMP) WITH MICROSCOPIC
Bilirubin Urine: NEGATIVE
GLUCOSE, UA: NEGATIVE mg/dL
Hgb urine dipstick: NEGATIVE
Ketones, ur: 5 mg/dL — AB
Nitrite: NEGATIVE
PROTEIN: NEGATIVE mg/dL
SPECIFIC GRAVITY, URINE: 1.013 (ref 1.005–1.030)
pH: 7 (ref 5.0–8.0)

## 2017-02-27 LAB — GLUCOSE, CAPILLARY
GLUCOSE-CAPILLARY: 106 mg/dL — AB (ref 65–99)
GLUCOSE-CAPILLARY: 99 mg/dL (ref 65–99)
GLUCOSE-CAPILLARY: 94 mg/dL (ref 65–99)
Glucose-Capillary: 124 mg/dL — ABNORMAL HIGH (ref 65–99)
Glucose-Capillary: 106 mg/dL — ABNORMAL HIGH (ref 65–99)
Glucose-Capillary: 100 mg/dL — ABNORMAL HIGH (ref 65–99)
Glucose-Capillary: 136 mg/dL — ABNORMAL HIGH (ref 65–99)
Glucose-Capillary: 115 mg/dL — ABNORMAL HIGH (ref 65–99)

## 2017-02-27 LAB — TSH: TSH: 5.749 u[IU]/mL — AB (ref 0.350–4.500)

## 2017-02-27 LAB — URINE CULTURE

## 2017-02-27 LAB — LIPID PANEL
Cholesterol: 164 mg/dL (ref 0–200)
HDL: 41 mg/dL (ref >40)
LDL CALC: 64 mg/dL (ref 0–99)
TRIGLYCERIDES: 293 mg/dL — AB (ref <150)
Total CHOL/HDL Ratio: 4 RATIO
VLDL: 59 mg/dL — ABNORMAL HIGH (ref 0–40)

## 2017-02-27 LAB — HEMOGLOBIN A1C
HEMOGLOBIN A1C: 6.1 % — AB (ref 4.8–5.6)
Mean Plasma Glucose: 128.37 mg/dL

## 2017-02-27 MED ORDER — DILTIAZEM HCL ER COATED BEADS 120 MG PO CP24
120.0000 mg | ORAL_CAPSULE | Freq: Every day | ORAL | Status: DC
  Administered 2017-02-28: 120 mg via ORAL
  Filled 2017-02-27: qty 1

## 2017-02-27 MED ORDER — METOPROLOL TARTRATE 25 MG PO TABS
25.0000 mg | ORAL_TABLET | Freq: Two times a day (BID) | ORAL | Status: DC
  Administered 2017-02-27 – 2017-03-01 (×4): 25 mg via ORAL
  Filled 2017-02-27 (×4): qty 1

## 2017-02-27 MED ORDER — CLONAZEPAM 0.5 MG PO TABS: 0.5000 mg | ORAL_TABLET | Freq: Three times a day (TID) | ORAL | Status: DC

## 2017-02-27 MED ORDER — MIRTAZAPINE 15 MG PO TABS
15.0000 mg | ORAL_TABLET | Freq: Every day | ORAL | Status: DC
  Administered 2017-02-27 – 2017-03-01 (×3): 15 mg via ORAL
  Filled 2017-02-27 (×3): qty 1

## 2017-02-27 MED ORDER — NITROFURANTOIN MONOHYD MACRO 100 MG PO CAPS
100.0000 mg | ORAL_CAPSULE | Freq: Two times a day (BID) | ORAL | Status: DC
  Administered 2017-02-27 – 2017-03-02 (×6): 100 mg via ORAL
  Filled 2017-02-27 (×6): qty 1

## 2017-02-27 MED ORDER — TOPIRAMATE 100 MG PO TABS: 100.0000 mg | ORAL_TABLET | Freq: Every day | ORAL | Status: DC | PRN

## 2017-02-27 MED ORDER — ARIPIPRAZOLE 5 MG PO TABS
5.0000 mg | ORAL_TABLET | Freq: Every day | ORAL | Status: DC
  Administered 2017-02-27 – 2017-03-02 (×4): 5 mg via ORAL
  Filled 2017-02-27 (×4): qty 1

## 2017-02-27 MED ORDER — CLONAZEPAM 0.5 MG PO TABS
0.5000 mg | ORAL_TABLET | Freq: Two times a day (BID) | ORAL | Status: DC | PRN
  Administered 2017-02-28: 0.5 mg via ORAL
  Filled 2017-02-27: qty 1

## 2017-02-27 MED ORDER — CLONIDINE HCL 0.1 MG PO TABS
0.1000 mg | ORAL_TABLET | Freq: Every day | ORAL | Status: DC
  Administered 2017-02-28 – 2017-03-01 (×2): 0.1 mg via ORAL
  Filled 2017-02-27 (×2): qty 1

## 2017-02-27 MED ORDER — LOSARTAN POTASSIUM 25 MG PO TABS
25.0000 mg | ORAL_TABLET | Freq: Every day | ORAL | Status: DC
  Administered 2017-02-28: 25 mg via ORAL
  Filled 2017-02-27 (×2): qty 1

## 2017-02-27 MED ORDER — LEVOTHYROXINE SODIUM 25 MCG PO TABS
25.0000 ug | ORAL_TABLET | Freq: Every day | ORAL | Status: DC
  Administered 2017-02-28 – 2017-03-02 (×3): 25 ug via ORAL
  Filled 2017-02-27 (×3): qty 1

## 2017-02-27 NOTE — Evaluation (Signed)
Physical Therapy Evaluation Patient Details Name: Meredith Cherry MRN: [id number] DOB: 23-Dec-1970 Today's Date: 02/27/2017   History of Present Illness  Pt admitted for suicide attempt on 02/20/17 with intentional overdose. History includes multiple suicide attempts, hospitalizations, bipolar, depression, HTN, and DM. Pt also with Chiari malformation and is s/p suboccipital crainectomy. Of note, pt orthostatic during admission with noted fall on 02/26/17, no injury per imaging.   Clinical Impression  Pt is a pleasant 47 year old female who was admitted for intentional overdose and PTSD. Pt performs transfers with mod I and ambulation with supervision and RW. Of note, pt s/p fall yesterday without RW. Reports she felt dizzy and slammed into the ground, no injury per chart review. Advised to use RW at all time. BP WNL this date improving from 122/79 sitting to 139/83 in standing. However, pt still reports vague dizziness with standing, does not go into specific details when asked further questioning. Pt actually becomes emotional and reports her day keeps getting interrupted by staff and starts crying. Kept evaluation short to be respectful of pt wishes and time. Pt demonstrates deficits with strength/balance/mobility. Educated on continuing education to maintain strength and prevent further falls. Also adjusted pt RW to correct height for optimal performance. Pt is close to baseline level and safe to ambulate with RW assist. Would benefit from skilled PT to address above deficits and promote optimal return to PLOF. Recommending OP PT for further follow up.      Follow Up Recommendations Outpatient PT    Equipment Recommendations  (has RW)    Recommendations for Other Services       Precautions / Restrictions Precautions Precautions: Fall Restrictions Weight Bearing Restrictions: No      Mobility  Bed Mobility               General bed mobility comments: not performed, received seated in  recliner  Transfers Overall transfer level: Modified independent Equipment used: Rolling walker (2 wheeled)             General transfer comment: safe technique performed with upright posture. Pt reports she feels slightly dizzy with standing, however resolves quickly.  Ambulation/Gait Ambulation/Gait assistance: Supervision Ambulation Distance (Feet): 200 Feet Assistive device: Rolling walker (2 wheeled) Gait Pattern/deviations: Step-through pattern     General Gait Details: slow gait speed with reciprocal gait pattern noted. Pt able to stay within frame of RW. Initially, RW too high, adjusted to pt height. No fatigue present. Some dizziness reported however upon further questioning, she reports "it's fine"  Stairs            Wheelchair Mobility    Modified Rankin (Stroke Patients Only)       Balance Overall balance assessment: History of Falls;Needs assistance Sitting-balance support: Feet supported Sitting balance-Leahy Scale: Good     Standing balance support: Bilateral upper extremity supported Standing balance-Leahy Scale: Fair                               Pertinent Vitals/Pain Pain Assessment: No/denies pain    Home Living Family/patient expects to be discharged to:: Private residence Living Arrangements: Spouse/significant other;Children Available Help at Discharge: Family Type of Home: House         Home Equipment: Gilford Rile - 2 wheels      Prior Function Level of Independence: Independent               Hand Dominance  Extremity/Trunk Assessment   Upper Extremity Assessment Upper Extremity Assessment: Overall WFL for tasks assessed    Lower Extremity Assessment Lower Extremity Assessment: Generalized weakness(R LE grossly 4/5; L LE Grossly 5/5)       Communication   Communication: No difficulties  Cognition Arousal/Alertness: Awake/alert Behavior During Therapy: WFL for tasks assessed/performed Overall  Cognitive Status: Within Functional Limits for tasks assessed                                        General Comments      Exercises Other Exercises Other Exercises: standing ther-ex performed on B LE including alt marching and hip abd. All ther-ex performed x 10 reps with supervision. Pt also educated on frequency and duration. Pt fatigues and requires seated rest break during performance.   Assessment/Plan    PT Assessment Patient needs continued PT services  PT Problem List Decreased strength;Decreased balance       PT Treatment Interventions Gait training;DME instruction;Therapeutic exercise    PT Goals (Current goals can be found in the Care Plan section)  Acute Rehab PT Goals Patient Stated Goal: to get stronger PT Goal Formulation: With patient Time For Goal Achievement: 03/13/17 Potential to Achieve Goals: Good    Frequency Min 2X/week   Barriers to discharge        Co-evaluation               AM-PAC PT "6 Clicks" Daily Activity  Outcome Measure Difficulty turning over in bed (including adjusting bedclothes, sheets and blankets)?: None Difficulty moving from lying on back to sitting on the side of the bed? : None Difficulty sitting down on and standing up from a chair with arms (e.g., wheelchair, bedside commode, etc,.)?: None Help needed moving to and from a bed to chair (including a wheelchair)?: None Help needed walking in hospital room?: None Help needed climbing 3-5 steps with a railing? : A Little 6 Click Score: 23    End of Session Equipment Utilized During Treatment: Gait belt Activity Tolerance: Patient tolerated treatment well Patient left: (left walking down hallway) Nurse Communication: Mobility status PT Visit Diagnosis: Muscle weakness (generalized) (M62.81);Difficulty in walking, not elsewhere classified (R26.2);History of falling (Z91.81)    Time: 4970-2637 PT Time Calculation (min) (ACUTE ONLY): 27 min   Charges:    PT Evaluation $PT Eval Low Complexity: 1 Low PT Treatments $Therapeutic Exercise: 8-22 mins   PT G Codes:        Meredith Cherry, PT, DPT [phone]   Meredith Cherry 02/27/2017, 3:19 PM

## 2017-02-27 NOTE — Progress Notes (Signed)
Pt had CT scan done without incident. Pt is medication compliant. Pt expressed she is still feeling depressed 8/10. Pt affect is flat. Pt denies pain at this time. Pt has passive SI without a plan. Pt denies ah/vh/hi at this time. Pt remains on Q15 mins safety round. Will cont to monitor pt.

## 2017-02-27 NOTE — [facility] Group Notes (Signed)
[facility] Group Notes:  (Nursing/MHT/Case Management/Adjunct)  Date:  02/27/2017  Time:  3:53 PM  Type of Therapy:  Psychoeducational Skills  Participation Level:  Did Not Attend   Summary of Progress/Problems:  Charna Busman 02/27/2017, 3:53 PM

## 2017-02-27 NOTE — [facility] Suicide Risk Assessment (Signed)
[facility] INPATIENT:  Family/Significant Other Suicide Prevention Education  Suicide Prevention Education:  Education Completed; Evee Liska, pt's husband, at [phone] has been identified by the patient as the family member/significant other with whom the patient will be residing, and identified as the person(s) who will aid the patient in the event of a mental health crisis (suicidal ideations/suicide attempt).  With written consent from the patient, the family member/significant other has been provided the following suicide prevention education, prior to the and/or following the discharge of the patient.  The suicide prevention education provided includes the following:  Suicide risk factors  Suicide prevention and interventions  National Suicide Hotline telephone number  [facility] assessment telephone number  [facility] Emergency Assistance [place] and/or Residential Mobile Crisis Unit telephone number  Request made of family/significant other to:  Remove weapons (e.g., guns, rifles, knives), all items previously/currently identified as safety concern.    Remove drugs/medications (over-the-counter, prescriptions, illicit drugs), all items previously/currently identified as a safety concern.  The family member/significant other verbalizes understanding of the suicide prevention education information provided.  The family member/significant other agrees to remove the items of safety concern listed above.  Pt's husband added, "This is an ongoing thing. It's been going on for years. She's been in every hospital in the state. This has been going on for about eight years. She's had mental issues long before now. Her family had very big mental issues. My daughter had a melt down in middle school and since then her mental health has gone down hill. This last go around, she's been in and out of hospitals. She can't stand pain. She's on about 15-20 different  medications. They keep changing them over and over again. They're psych medications and health medications. 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 She's running away from reality. My wife couldn't keep a job when we got married because she can't handle stress. So, she had kids and they have some mental issues too. Sometimes they (the daughter and pt) can get along, and sometimes they get along with each other. My wife just changed psychiatrists because they have an office in [place] and [place]. She has done this so many times--calling 911 and going to all these EDs--she basically goes to the ER every month. When the kids get out of control, she fakes this mental stuff and goes to the ED to get away from it. The pain, I know that's real, but they don't give her any codeine or anything like that. She says that she's tried to kill herself about 12 times just to get away from the stressful environment at the house. Then, when she's ready to be discharged, she'll tell the doctor she's ready to come home. Then, it works until she gets stressed out and she's back to the ED. She knows she's playing a game."   Alden Hipp, LCSW 02/27/2017, 9:40 AM

## 2017-02-27 NOTE — Progress Notes (Signed)
The patient is noted holding onto rails when ambulating. Dr. Wonda Olds notified and received an order for PT consult. Order placed. Also received an order for her to use a rolling walker. Patient denied any acute pain. Went to [facility]. Patient was observed crying. She stated, "I'm sorry, I'm having a melt down." Dr. Wonda Olds took patient to her office during her crying spell. Will continue camera surveillance and 15 min. Checks.

## 2017-02-27 NOTE — Progress Notes (Signed)
Recreation Therapy Notes  Date: 02.05.2019  Time: 1:00 PM  Location: Craft Room  Behavioral response: Appropriate   Intervention Topic: Stress  Discussion/Intervention: Group content on today was focused on stress. The group defined stress and way to cope with stress. Participants expressed how they know when they are stresses out. Individuals described the different ways they have to cope with stress. The group stated reasons why it is important to cope with stress. Patient explained what good stress is and some examples. The group participated in the intervention "Stress Management". Individuals were separated into two group and answered questions related to stress.  Clinical Observations/Feedback: Patient came to group and was focused on what her peers and staff had to say about the topic at hand. Individual participated in the intervention and continues to make progress towards her goals.  Markisha Meding LRT/CTRS         Tej Murdaugh 02/27/2017 3:00 PM

## 2017-02-27 NOTE — [facility] Group Notes (Signed)
  LCSW Group Therapy Note 02/27/2017 9:00am  Type of Therapy and Topic:  Group Therapy:  Setting Goals  Participation Level:  Did Not Attend  Description of Group: In this process group, patients discussed using strengths to work toward goals and address challenges.  Patients identified two positive things about themselves and one goal they were working on.  Patients were given the opportunity to share openly and support each other's plan for self-empowerment.  The group discussed the value of gratitude and were encouraged to have a daily reflection of positive characteristics or circumstances.  Patients were encouraged to identify a plan to utilize their strengths to work on current challenges and goals.  Therapeutic Goals 1. Patient will verbalize personal strengths/positive qualities and relate how these can assist with achieving desired personal goals 2. Patients will verbalize affirmation of peers plans for personal change and goal setting 3. Patients will explore the value of gratitude and positive focus as related to successful achievement of goals 4. Patients will verbalize a plan for regular reinforcement of personal positive qualities and circumstances.  Summary of Patient Progress:       Therapeutic Modalities Cognitive Behavioral Therapy Motivational Interviewing    August Saucer, LCSW 02/27/2017 5:42 PM

## 2017-02-27 NOTE — [facility] Group Notes (Signed)
02/27/2017 9:30AM  Type of Therapy/Topic:  Group Therapy:  Feelings about Diagnosis  Participation Level:  Active   Description of Group:   This group will allow patients to explore their thoughts and feelings about diagnoses they have received. Patients will be guided to explore their level of understanding and acceptance of these diagnoses. Facilitator will encourage patients to process their thoughts and feelings about the reactions of others to their diagnosis and will guide patients in identifying ways to discuss their diagnosis with significant others in their lives. This group will be process-oriented, with patients participating in exploration of their own experiences, giving and receiving support, and processing challenge from other group members.   Therapeutic Goals: 1. Patient will demonstrate understanding of diagnosis as evidenced by identifying two or more symptoms of the disorder 2. Patient will be able to express two feelings regarding the diagnosis 3. Patient will demonstrate their ability to communicate their needs through discussion and/or role play  Summary of Patient Progress: Meredith Cherry came into group towards the end. She used active listening when others in the group where talking. She spoke about a need to receive a therapist to be able to talk to when she is discharged from here. She was depressed and teary while in group.   Therapeutic Modalities:   Cognitive Behavioral Therapy Brief Therapy Feelings Identification    Darin Engels, LCSW 02/27/2017 10:16 AM
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
## 2017-02-28 LAB — CBC
HCT: 35.5 % (ref 35.0–47.0)
Hemoglobin: 11.7 g/dL — ABNORMAL LOW (ref 12.0–16.0)
MCH: 28.5 pg (ref 26.0–34.0)
MCHC: 33 g/dL (ref 32.0–36.0)
MCV: 86.3 fL (ref 80.0–100.0)
PLATELETS: 467 10*3/uL — AB (ref 150–440)
RBC: 4.11 MIL/uL (ref 3.80–5.20)
RDW: 14.7 % — AB (ref 11.5–14.5)
WBC: 8.3 10*3/uL (ref 3.6–11.0)

## 2017-02-28 LAB — COMPREHENSIVE METABOLIC PANEL
ALT: 18 U/L (ref 14–54)
AST: 23 U/L (ref 15–41)
Albumin: 4.1 g/dL (ref 3.5–5.0)
Alkaline Phosphatase: 77 U/L (ref 38–126)
Anion gap: 12 (ref 5–15)
BUN: 17 mg/dL (ref 6–20)
CHLORIDE: 109 mmol/L (ref 101–111)
CO2: 19 mmol/L — ABNORMAL LOW (ref 22–32)
Calcium: 9 mg/dL (ref 8.9–10.3)
Creatinine, Ser: 0.77 mg/dL (ref 0.44–1.00)
Glucose, Bld: 108 mg/dL — ABNORMAL HIGH (ref 65–99)
POTASSIUM: 3.7 mmol/L (ref 3.5–5.1)
SODIUM: 140 mmol/L (ref 135–145)
Total Bilirubin: 0.5 mg/dL (ref 0.3–1.2)
Total Protein: 7.7 g/dL (ref 6.5–8.1)

## 2017-02-28 LAB — GLUCOSE, CAPILLARY
GLUCOSE-CAPILLARY: 132 mg/dL — AB (ref 65–99)
GLUCOSE-CAPILLARY: 105 mg/dL — AB (ref 65–99)
GLUCOSE-CAPILLARY: 112 mg/dL — AB (ref 65–99)
GLUCOSE-CAPILLARY: 104 mg/dL — AB (ref 65–99)

## 2017-02-28 LAB — T4, FREE: Free T4: 0.67 ng/dL (ref 0.61–1.12)

## 2017-02-28 MED ORDER — TOPIRAMATE 100 MG PO TABS
100.0000 mg | ORAL_TABLET | Freq: Every day | ORAL | Status: DC
  Administered 2017-03-01 – 2017-03-02 (×2): 100 mg via ORAL
  Filled 2017-02-28 (×2): qty 1

## 2017-02-28 MED ORDER — PROMETHAZINE HCL 25 MG/ML IJ SOLN
25.0000 mg | Freq: Three times a day (TID) | INTRAMUSCULAR | Status: DC | PRN
  Administered 2017-02-28 – 2017-03-01 (×2): 25 mg via INTRAMUSCULAR
  Filled 2017-02-28 (×4): qty 1

## 2017-02-28 MED ORDER — SUMATRIPTAN SUCCINATE 6 MG/0.5ML ~~LOC~~ SOLN
6.0000 mg | SUBCUTANEOUS | Status: DC | PRN
  Administered 2017-02-28 – 2017-03-01 (×2): 6 mg via SUBCUTANEOUS
  Filled 2017-02-28 (×4): qty 0.5

## 2017-02-28 MED ORDER — DILTIAZEM HCL ER COATED BEADS 240 MG PO CP24
240.0000 mg | ORAL_CAPSULE | Freq: Every day | ORAL | Status: DC
  Administered 2017-03-01 – 2017-03-02 (×2): 240 mg via ORAL
  Filled 2017-02-28 (×2): qty 1

## 2017-02-28 NOTE — BHH Group Notes (Signed)
[place] Group Notes:  (Nursing/MHT/Case Management/Adjunct)  Date:  02/28/2017  Time:  11:16 PM  Type of Therapy:  Group Therapy  Participation Level:  Active  Participation Quality:  Crying in grp. Pt. said head hurting.  Affect:  Appropriate  Cognitive:  Alert  Insight:  Appropriate  Engagement in Group:  Supportive  Modes of Intervention:  Activity  Summary of Progress/Problems:  Nehemiah Settle 02/28/2017, 11:16 PM

## 2017-02-28 NOTE — BHH Group Notes (Signed)
[place] Group Notes:  (Nursing/MHT/Case Management/Adjunct)  Date:  02/28/2017  Time:  3:16 PM  Type of Therapy:  Psychoeducational Skills  Participation Level:  Minimal  Participation Quality:  Resistant  Affect:  Flat and Tearful  Cognitive:  Alert  Insight:  Good  Engagement in Group:  Resistant  Modes of Intervention:  Discussion and Education  Summary of Progress/Problems:  Charna Busman 02/28/2017, 3:16 PM

## 2017-02-28 NOTE — BHH Group Notes (Signed)
[place] Group Notes:  (Nursing/MHT/Case Management/Adjunct)  Date:  02/28/2017  Time:  6:45AM  Type of Therapy:  Psychoeducational Skills  Participation Level:  Did Not Attend  Summary of Progress/Problems:  Reece Agar 02/28/2017, 6:45AM

## 2017-02-28 NOTE — Tx Team (Addendum)
Interdisciplinary Treatment and Diagnostic Plan Update  02/28/2017 Time of Session: [name] MRN: [id number]  Principal Diagnosis: PTSD (post-traumatic stress disorder)  Secondary Diagnoses: Principal Problem:   PTSD (post-traumatic stress disorder) Active Problems:   Hypertension   GERD (gastroesophageal reflux disease)   Prolonged QT interval   Major depressive disorder, recurrent severe without psychotic features ([name])   Diabetes ([name])   Migraine   Borderline personality disorder ([name])   Current Medications:  Current Facility-Administered Medications  Medication Dose Route Frequency Provider Last Rate Last Dose  . acetaminophen (TYLENOL) tablet 650 mg  650 mg Oral Q6H PRN Cherry, Meredith B, MD      . alum & mag hydroxide-simeth (MAALOX/MYLANTA) 200-200-20 MG/5ML suspension 30 mL  30 mL Oral Q4H PRN Cherry, Meredith B, MD      . ARIPiprazole (ABILIFY) tablet 5 mg  5 mg Oral Daily Cherry, Meredith Babinski, MD   5 mg at 02/28/17 0745  . aspirin EC tablet 81 mg  81 mg Oral Daily Cherry, Meredith B, MD   81 mg at 02/28/17 0745  . clonazePAM (KLONOPIN) tablet 0.5 mg  0.5 mg Oral BID PRN Cherry, Meredith Babinski, MD      . cloNIDine (CATAPRES) tablet 0.1 mg  0.1 mg Oral QHS Cherry, Meredith Babinski, MD      . Derrill Memo ON 03/01/2017] diltiazem (CARDIZEM CD) 24 hr capsule 240 mg  240 mg Oral Daily Cherry, Meredith R, MD      . divalproex (DEPAKOTE) DR tablet 500 mg  500 mg Oral Q12H Cherry, Meredith B, MD   500 mg at 02/28/17 0745  . insulin aspart (novoLOG) injection 0-5 Units  0-5 Units Subcutaneous QHS Cherry, Meredith B, MD      . insulin aspart (novoLOG) injection 0-9 Units  0-9 Units Subcutaneous TID WC Cherry, Meredith B, MD   1 Units at 02/28/17 0745  . levothyroxine (SYNTHROID, LEVOTHROID) tablet 25 mcg  25 mcg Oral QAC breakfast Meredith Crosby, MD   25 mcg at 02/28/17 0746  . losartan (COZAAR) tablet 25 mg  25 mg Oral Daily Cherry, Meredith Babinski, MD   25 mg at 02/28/17 0747  . magnesium  hydroxide (MILK OF MAGNESIA) suspension 30 mL  30 mL Oral Daily PRN Cherry, Meredith B, MD      . metFORMIN (GLUCOPHAGE) tablet 1,000 mg  1,000 mg Oral BID WC Cherry, Meredith B, MD   1,000 mg at 02/28/17 0745  . metoprolol tartrate (LOPRESSOR) tablet 25 mg  25 mg Oral BID Meredith Crosby, MD   25 mg at 02/28/17 0745  . mirtazapine (REMERON) tablet 15 mg  15 mg Oral QHS Cherry, Meredith R, MD   15 mg at 02/27/17 2126  . nitrofurantoin (macrocrystal-monohydrate) (MACROBID) capsule 100 mg  100 mg Oral Q12H Cherry, Meredith Babinski, MD   100 mg at 02/28/17 0745  . pravastatin (PRAVACHOL) tablet 20 mg  20 mg Oral q1800 Cherry, Meredith B, MD   20 mg at 02/27/17 1704  . SUMAtriptan (IMITREX) tablet 100 mg  100 mg Oral Q2H PRN Cherry, Meredith B, MD      . topiramate (TOPAMAX) tablet 100 mg  100 mg Oral Daily PRN Cherry, Meredith Babinski, MD       PTA Medications: Medications Prior to Admission  Medication Sig Dispense Refill Last Dose  . ARIPiprazole (ABILIFY) 5 MG tablet Take 1 tablet (5 mg total) by mouth daily. 30 tablet 0   . aspirin EC 81 MG EC tablet Take  1 tablet (81 mg total) by mouth daily. 30 tablet 0 02/22/2017 at Unknown time  . clonazePAM (KLONOPIN) 0.5 MG tablet Take 1 tablet (0.5 mg total) by mouth 3 (three) times daily. 15 tablet 0 02/22/2017 at Unknown time  . cloNIDine (CATAPRES) 0.1 MG tablet Take 0.1 mg by mouth 3 (three) times daily.    Past Week at Unknown time  . diltiazem (CARDIZEM CD) 240 MG 24 hr capsule Take 1 capsule (240 mg total) by mouth daily. 30 capsule 0   . divalproex (DEPAKOTE) 500 MG DR tablet Take 1 tablet (500 mg total) by mouth every 12 (twelve) hours. 60 tablet 0 Past Week at Unknown time  . EPINEPHrine 0.15 MG/0.15ML IJ injection Inject 0.15 mg into the muscle as needed for anaphylaxis.    not used  . losartan (COZAAR) 100 MG tablet Take 0.5 tablets (50 mg total) by mouth daily. 30 tablet 0   . metFORMIN (GLUCOPHAGE) 1000 MG tablet Take 1,000 mg by mouth 2 (two) times daily.    Past Week at Unknown time  . metoprolol tartrate (LOPRESSOR) 50 MG tablet Take 1 tablet (50 mg total) by mouth 2 (two) times daily. 60 tablet 0   . mirtazapine (REMERON) 15 MG tablet Take 1 tablet (15 mg total) by mouth at bedtime. 30 tablet 0   . omeprazole (PRILOSEC) 20 MG capsule Take 20 mg by mouth daily.   Past Week at Unknown time  . pravastatin (PRAVACHOL) 20 MG tablet Take 20 mg by mouth daily.    Past Week at Unknown time  . prazosin (MINIPRESS) 2 MG capsule Take 1 capsule (2 mg total) by mouth at bedtime. (Patient not taking: Reported on 02/20/2017) 30 capsule 0 Not Taking at Unknown time  . QUEtiapine (SEROQUEL) 300 MG tablet Take 300 mg by mouth at bedtime.   Past Week at Unknown time  . ranitidine (ZANTAC) 150 MG tablet Take 150 mg by mouth at bedtime.   Past Week at Unknown time  . SUMAtriptan (IMITREX) 100 MG tablet Take 100 mg by mouth every 2 (two) hours as needed for migraine.    not used  . tiZANidine (ZANAFLEX) 2 MG tablet Take 2 mg by mouth every 6 (six) hours as needed for muscle spasms.   Past Month at Unknown time  . topiramate (TOPAMAX) 100 MG tablet Take 100 mg by mouth daily as needed (headache).    Past Month at Unknown time  . traMADol (ULTRAM) 50 MG tablet Take 50 mg by mouth every 6 (six) hours as needed for moderate pain.   Past Month at Unknown time  . traZODone (DESYREL) 50 MG tablet Take 50 mg by mouth at bedtime.   Past Week at Unknown time    Patient Stressors: Marital or family conflict Traumatic event  Patient Strengths: Average or above average intelligence Capable of independent living Communication skills Supportive family/friends  Treatment Modalities: Medication Management, Group therapy, Case management,  1 to 1 session with clinician, Psychoeducation, Recreational therapy.   Physician Treatment Plan for Primary Diagnosis: PTSD (post-traumatic stress disorder) Long Term Goal(s): Improvement in symptoms so as ready for discharge   Short Term  Goals: Ability to verbalize feelings will improve Ability to disclose and discuss suicidal ideas Ability to demonstrate self-control will improve  Medication Management: Evaluate patient's response, side effects, and tolerance of medication regimen.  Therapeutic Interventions: 1 to 1 sessions, Unit Group sessions and Medication administration.  Evaluation of Outcomes: Progressing  Physician Treatment Plan for Secondary Diagnosis: Principal  Problem:   PTSD (post-traumatic stress disorder) Active Problems:   Hypertension   GERD (gastroesophageal reflux disease)   Prolonged QT interval   Major depressive disorder, recurrent severe without psychotic features ([place])   Diabetes ([place])   Migraine   Borderline personality disorder ([place])  Long Term Goal(s): Improvement in symptoms so as ready for discharge   Short Term Goals: Ability to verbalize feelings will improve Ability to disclose and discuss suicidal ideas Ability to demonstrate self-control will improve     Medication Management: Evaluate patient's response, side effects, and tolerance of medication regimen.  Therapeutic Interventions: 1 to 1 sessions, Unit Group sessions and Medication administration.  Evaluation of Outcomes: Progressing   RN Treatment Plan for Primary Diagnosis: PTSD (post-traumatic stress disorder) Long Term Goal(s): Knowledge of disease and therapeutic regimen to maintain health will improve  Short Term Goals: Ability to verbalize feelings will improve, Ability to identify and develop effective coping behaviors will improve and Compliance with prescribed medications will improve  Medication Management: RN will administer medications as ordered by provider, will assess and evaluate patient's response and provide education to patient for prescribed medication. RN will report any adverse and/or side effects to prescribing provider.  Therapeutic Interventions: 1 on 1 counseling sessions, Psychoeducation,  Medication administration, Evaluate responses to treatment, Monitor vital signs and CBGs as ordered, Perform/monitor CIWA, COWS, AIMS and Fall Risk screenings as ordered, Perform wound care treatments as ordered.  Evaluation of Outcomes: Progressing   LCSW Treatment Plan for Primary Diagnosis: PTSD (post-traumatic stress disorder) Long Term Goal(s): Safe transition to appropriate next level of care at discharge, Engage patient in therapeutic group addressing interpersonal concerns.  Short Term Goals: Engage patient in aftercare planning with referrals and resources, Identify triggers associated with mental health/substance abuse issues and Increase skills for wellness and recovery  Therapeutic Interventions: Assess for all discharge needs, 1 to 1 time with Social worker, Explore available resources and support systems, Assess for adequacy in community support network, Educate family and significant other(s) on suicide prevention, Complete Psychosocial Assessment, Interpersonal group therapy.  Evaluation of Outcomes: Progressing   Progress in Treatment: Attending groups: Yes. Participating in groups: Yes. Taking medication as prescribed: Yes. Toleration medication: Yes. Family/Significant other contact made: Yes, individual(s) contacted:  with pt's husband. Patient understands diagnosis: Yes. Discussing patient identified problems/goals with staff: Yes. Medical problems stabilized or resolved: Yes. Denies suicidal/homicidal ideation: Yes. Issues/concerns per patient self-inventory: No. Other: None at this time.   New problem(s) identified: No, Describe:  none at this time.  New Short Term/Long Term Goal(s): Pt reported her goal for treatment is to, "get out of here."   Discharge Plan or Barriers: CSW will continue to assess for appropriate discharge plan. Pt will return home and will continue tx in the outpatient setting.   Reason for Continuation of Hospitalization:  Depression Medication stabilization  Estimated Length of Stay: 2 days  Recreational Therapy: Patient Stressors: Family, Relationship, Death Patient Goal: Patient will identify 3 positive coping skills strategies to use post d/c within 5 recreation therapy group sessions  Attendees: Patient: Meredith Cherry  02/28/2017 11:20 AM  Physician: Dr. Wonda Olds, MD 02/28/2017 11:20 AM  Nursing: Tami Lin, RN 02/28/2017 11:20 AM  RN Care Manager: 02/28/2017 11:20 AM  Social Worker: Alden Hipp, LCSW 02/28/2017 11:20 AM  Recreational Therapist: Roanna Epley, CTRS-LRT 02/28/2017 11:20 AM  Other: Darin Engels, [place] 02/28/2017 11:20 AM  Other:  02/28/2017 11:20 AM  Other: 02/28/2017 11:20 AM    Scribe for Treatment Team: Merleen Nicely  Cecilie Lowers, [place] 02/28/2017 11:20 AM

## 2017-02-28 NOTE — Progress Notes (Signed)
Patient ID: Meredith Cherry, female   DOB: [birth year], 47 y.o.   MRN: [id number] A&Ox3, ambulates with a rolling walker, room closer to the nurses' station, encouraged to use call bell to prompt for help; isolative to room, did not attend group, pleasant to deal with, medications given at bedside.

## 2017-02-28 NOTE — BHH Group Notes (Signed)
02/28/2017 1PM  Type of Therapy/Topic:  Group Therapy:  Emotion Regulation  Participation Level:  Active   Description of Group:   The purpose of this group is to assist patients in learning to regulate negative emotions and experience positive emotions. Patients will be guided to discuss ways in which they have been vulnerable to their negative emotions. These vulnerabilities will be juxtaposed with experiences of positive emotions or situations, and patients will be challenged to use positive emotions to combat negative ones. Special emphasis will be placed on coping with negative emotions in conflict situations, and patients will process healthy conflict resolution skills.  Therapeutic Goals: 1. Patient will identify two positive emotions or experiences to reflect on in order to balance out negative emotions 2. Patient will label two or more emotions that they find the most difficult to experience 3. Patient will demonstrate positive conflict resolution skills through discussion and/or role plays  Summary of Patient Progress: Actively and appropriately engaged in the group. Patient was able to provide support and validation to other group members.Patient practiced active listening when interacting with the facilitator and other group members Patient in still in the process of obtaining treatment goals. Patient spoke about her goals of "I don't want to OD again."       Therapeutic Modalities:   Cognitive Behavioral Therapy Feelings Identification Dialectical Behavioral Therapy   Darin Engels, [place] 02/28/2017 2:36 PM

## 2017-02-28 NOTE — Progress Notes (Signed)
Macrobid ABT in progress without  any side effect. The patient received Tylenol 650 mg  Oral for c/o headache of 3/10 on  A pain scale at 1645.  PRN Pain med was effective. Patient was observed very sad tearful  without telling this RN her reason for behaving  that way. Support offered to the patient. Will continue camera surveillance and 15 minutes checks.

## 2017-02-28 NOTE — Progress Notes (Signed)
Recreation Therapy Notes  Date: 02.06.2019  Time: 1:00 PM  Location: Craft Room  Behavioral response: N/A  Intervention Topic: Creative expressions  Discussion/Intervention: Patient did not attend group. Clinical Observations/Feedback: Patient did not attend group.    Keilin Gamboa LRT/CTRS           Devarious Pavek 02/28/2017 2:28 PM

## 2017-02-28 NOTE — Progress Notes (Signed)
[facility] MD Progress Note  02/28/2017 3:56 PM Meredith Cherry  MRN:  [id number] Subjective:  Pt is in much better spirits today. She is laughing and joking with this provider. She denies SI today. She begins talking about her husband and issues with him and then states, "Oh man now I'm having bad thoughts again." Processed together that suicidal thoughts can come and go but it will be important to learn coping skills on how to deal with these thoughts when they come. She states that her husband does not care about her and she may want to move out. She states that when she was in [facility] a man met with her with [facility] and told her to walk in there. She plans to do this as soon as she is discharged to see what kind of resources they have. She states that she was scared last night because there was a loud peer on the unit that she was nervous about. She is attending groups today and is joking and laughing with peers.   Principal Problem: PTSD (post-traumatic stress disorder) Diagnosis:   Patient Active Problem List   Diagnosis Date Noted  . PTSD (post-traumatic stress disorder) [F43.10] 02/27/2017    Priority: High  . Major depressive disorder, recurrent severe without psychotic features ([place]) [F33.2] 02/26/2017    Priority: High  . Borderline personality disorder ([place]) [F60.3] 02/27/2017    Priority: Medium  . Diabetes ([place]) [E11.9] 02/26/2017  . Migraine [G43.909] 02/26/2017  . Hypertensive urgency [I16.0] 02/23/2017  . Uncontrolled hypertension [I10] 02/22/2017  . Bipolar affect, depressed ([place]) [F31.30] 02/19/2017  . History of Chiari malformation [Z86.69] 11/15/2015  . 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 Prolonged QT interval [R94.31] 11/15/2015  . Panic attack [F41.0]   . Hypertension [I10]   . GERD (gastroesophageal reflux disease) [K21.9]   . Depression [F32.9]   . Anxiety [F41.9]   . ADHD (attention deficit hyperactivity disorder) [F90.9]   . Acute encephalopathy [G93.40]   .  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 Snoring [R06.83] 12/07/2011   Total Time spent with patient: 20 minutes  Past Psychiatric History: See H&P  Past Medical History:  Past Medical History:  Diagnosis Date  . ADHD (attention deficit hyperactivity disorder)   . Allergic rhinitis   . Anxiety   . Asthma   . Bipolar 1 disorder ([place])   . Chiari malformation type I ([place])   . Chronic migraine   . Depression   . Diabetes mellitus without complication ([place])   . GERD (gastroesophageal reflux disease)   . Hypertension   . Panic attack   . Psychiatric disorder     Past Surgical History:  Procedure Laterality Date  . CHOLECYSTECTOMY    . TUBAL LIGATION     Family History:  Family History  Problem Relation Age of Onset  . Stroke Mother        x 5  . Hypertension Mother   . Heart disease Mother   . Diabetes Mother   . Diabetes Father   . Heart disease Maternal Grandmother   . Breast cancer Paternal Grandmother   . Asthma Brother    Family Psychiatric  History: See H&P Social History:  Social History   Substance and Sexual Activity  Alcohol Use No     Social History   Substance and Sexual Activity  Drug Use No    Social History   Socioeconomic History  . Marital status: Married    Spouse name: None  . Number of children: 2  .  Years of education: None  . Highest education level: None  Social Needs  . Financial resource strain: None  . Food insecurity - worry: None  . Food insecurity - inability: None  . Transportation needs - medical: None  . Transportation needs - non-medical: None  Occupational History  . Occupation: unemployed  Tobacco Use  . Smoking status: Former Smoker    Types: Cigarettes  . Smokeless tobacco: Never Used  Substance and Sexual Activity  . Alcohol use: No  . Drug use: No  . Sexual activity: None  Other Topics Concern  . None  Social History Narrative  . None   Additional Social History:                         Sleep:  Good  Appetite:  Good  Current Medications: Current Facility-Administered Medications  Medication Dose Route Frequency Provider Last Rate Last Dose  . acetaminophen (TYLENOL) tablet 650 mg  650 mg Oral Q6H PRN Pucilowska, Jolanta B, MD      . alum & mag hydroxide-simeth (MAALOX/MYLANTA) 200-200-20 MG/5ML suspension 30 mL  30 mL Oral Q4H PRN Pucilowska, Jolanta B, MD      . ARIPiprazole (ABILIFY) tablet 5 mg  5 mg Oral Daily Harshita Bernales, Tyson Babinski, MD   5 mg at 02/28/17 0745  . aspirin EC tablet 81 mg  81 mg Oral Daily Pucilowska, Jolanta B, MD   81 mg at 02/28/17 0745  . clonazePAM (KLONOPIN) tablet 0.5 mg  0.5 mg Oral BID PRN Rubert Frediani, Tyson Babinski, MD      . cloNIDine (CATAPRES) tablet 0.1 mg  0.1 mg Oral QHS Darus Hershman, Tyson Babinski, MD      . Derrill Memo ON 03/01/2017] diltiazem (CARDIZEM CD) 24 hr capsule 240 mg  240 mg Oral Daily Javon Hupfer R, MD      . divalproex (DEPAKOTE) DR tablet 500 mg  500 mg Oral Q12H Pucilowska, Jolanta B, MD   500 mg at 02/28/17 0745  . insulin aspart (novoLOG) injection 0-5 Units  0-5 Units Subcutaneous QHS Pucilowska, Jolanta B, MD      . insulin aspart (novoLOG) injection 0-9 Units  0-9 Units Subcutaneous TID WC Pucilowska, Jolanta B, MD   1 Units at 02/28/17 0745  . levothyroxine (SYNTHROID, LEVOTHROID) tablet 25 mcg  25 mcg Oral QAC breakfast Marylin Crosby, MD   25 mcg at 02/28/17 0746  . losartan (COZAAR) tablet 25 mg  25 mg Oral Daily Kanden Carey, Tyson Babinski, MD   25 mg at 02/28/17 0747  . magnesium hydroxide (MILK OF MAGNESIA) suspension 30 mL  30 mL Oral Daily PRN Pucilowska, Jolanta B, MD      . metFORMIN (GLUCOPHAGE) tablet 1,000 mg  1,000 mg Oral BID WC Pucilowska, Jolanta B, MD   1,000 mg at 02/28/17 0745  . metoprolol tartrate (LOPRESSOR) tablet 25 mg  25 mg Oral BID Marylin Crosby, MD   25 mg at 02/28/17 0745  . mirtazapine (REMERON) tablet 15 mg  15 mg Oral QHS Maximina Pirozzi R, MD   15 mg at 02/27/17 2126  . nitrofurantoin (macrocrystal-monohydrate) (MACROBID) capsule 100 mg  100 mg  Oral Q12H Pavle Wiler, Tyson Babinski, MD   100 mg at 02/28/17 0745  . pravastatin (PRAVACHOL) tablet 20 mg  20 mg Oral q1800 Pucilowska, Jolanta B, MD   20 mg at 02/27/17 1704  . SUMAtriptan (IMITREX) tablet 100 mg  100 mg Oral Q2H PRN Pucilowska, Jolanta B, MD      .  topiramate (TOPAMAX) tablet 100 mg  100 mg Oral Daily PRN Delorse Shane, Tyson Babinski, MD        Lab Results:  Results for orders placed or performed during the hospital encounter of 02/26/17 (from the past 48 hour(s))  Glucose, capillary     Status: Abnormal   Collection Time: 02/26/17  5:17 PM  Result Value Ref Range   Glucose-Capillary 136 (H) 65 - 99 mg/dL  Glucose, capillary     Status: Abnormal   Collection Time: 02/26/17  5:46 PM  Result Value Ref Range   Glucose-Capillary 162 (H) 65 - 99 mg/dL  Glucose, capillary     Status: None   Collection Time: 02/26/17  9:14 PM  Result Value Ref Range   Glucose-Capillary 77 65 - 99 mg/dL  Glucose, capillary     Status: Abnormal   Collection Time: 02/27/17  7:06 AM  Result Value Ref Range   Glucose-Capillary 106 (H) 65 - 99 mg/dL   Comment 1 Notify RN   Hemoglobin A1c     Status: Abnormal   Collection Time: 02/27/17  7:34 AM  Result Value Ref Range   Hgb A1c MFr Bld 6.1 (H) 4.8 - 5.6 %    Comment: (NOTE) Pre diabetes:          5.7%-6.4% Diabetes:              >6.4% Glycemic control for   <7.0% adults with diabetes    Mean Plasma Glucose 128.37 mg/dL    Comment: Performed at [facility], [place] [address], [place], [place] [address]  Lipid panel     Status: Abnormal   Collection Time: 02/27/17  7:34 AM  Result Value Ref Range   Cholesterol 164 0 - 200 mg/dL   Triglycerides 293 (H) <150 mg/dL   HDL 41 >40 mg/dL   Total CHOL/HDL Ratio 4.0 RATIO   VLDL 59 (H) 0 - 40 mg/dL   LDL Cholesterol 64 0 - 99 mg/dL    Comment:        Total Cholesterol/HDL:CHD Risk Coronary Heart Disease Risk Table                     Men   Women  1/2 Average Risk   3.4   3.3  Average Risk       5.0   4.4  2  X Average Risk   9.6   7.1  3 X Average Risk  23.4   11.0        Use the calculated Patient Ratio above and the CHD Risk Table to determine the patient's CHD Risk.        ATP III CLASSIFICATION (LDL):  <100     mg/dL   Optimal  100-129  mg/dL   Near or Above                    Optimal  130-159  mg/dL   Borderline  160-189  mg/dL   High  >190     mg/dL   Very High Performed at [facility], [address] [place], [place] [address]   TSH     Status: Abnormal   Collection Time: 02/27/17  7:34 AM  Result Value Ref Range   TSH 5.749 (H) 0.350 - 4.500 uIU/mL    Comment: Performed by a 3rd Generation assay with a functional sensitivity of <=0.01 uIU/mL. Performed at [facility], [address] [place], [place] [address]  Urinalysis, Complete w Microscopic     Status: Abnormal   Collection Time: 02/27/17 10:45 AM  Result Value Ref Range   Color, Urine YELLOW (A) YELLOW   APPearance TURBID (A) CLEAR   Specific Gravity, Urine 1.013 1.005 - 1.030   pH 7.0 5.0 - 8.0   Glucose, UA NEGATIVE NEGATIVE mg/dL   Hgb urine dipstick NEGATIVE NEGATIVE   Bilirubin Urine NEGATIVE NEGATIVE   Ketones, ur 5 (A) NEGATIVE mg/dL   Protein, ur NEGATIVE NEGATIVE mg/dL   Nitrite NEGATIVE NEGATIVE   Leukocytes, UA MODERATE (A) NEGATIVE   RBC / HPF 0-5 0 - 5 RBC/hpf   WBC, UA 0-5 0 - 5 WBC/hpf   Bacteria, UA RARE (A) NONE SEEN   Squamous Epithelial / LPF 6-30 (A) NONE SEEN    Comment: Performed at [facility], [address] [place], [place] [address]  Glucose, capillary     Status: None   Collection Time: 02/27/17 11:22 AM  Result Value Ref Range   Glucose-Capillary 99 65 - 99 mg/dL  Glucose, capillary     Status: Abnormal   Collection Time: 02/27/17  4:32 PM  Result Value Ref Range   Glucose-Capillary 115 (H) 65 - 99 mg/dL  Glucose, capillary     Status: None   Collection Time: 02/27/17  8:31 PM  Result Value Ref Range   Glucose-Capillary 94 65 - 99 mg/dL    Comment 1 Notify RN   T4, free     Status: None   Collection Time: 02/28/17  6:54 AM  Result Value Ref Range   Free T4 0.67 0.61 - 1.12 ng/dL    Comment: (NOTE) Biotin ingestion may interfere with free T4 tests. If the results are inconsistent with the TSH level, previous test results, or the clinical presentation, then consider biotin interference. If needed, order repeat testing after stopping biotin. Performed at [facility], [address] [place], [place] [address]   CBC     Status: Abnormal   Collection Time: 02/28/17  6:54 AM  Result Value Ref Range   WBC 8.3 3.6 - 11.0 K/uL   RBC 4.11 3.80 - 5.20 MIL/uL   Hemoglobin 11.7 (L) 12.0 - 16.0 g/dL   HCT 35.5 35.0 - 47.0 %   MCV 86.3 80.0 - 100.0 fL   MCH 28.5 26.0 - 34.0 pg   MCHC 33.0 32.0 - 36.0 g/dL   RDW 14.7 (H) 11.5 - 14.5 %   Platelets 467 (H) 150 - 440 K/uL    Comment: Performed at [facility], [address] [place], [place] [address]  Comprehensive metabolic panel     Status: Abnormal   Collection Time: 02/28/17  6:54 AM  Result Value Ref Range   Sodium 140 135 - 145 mmol/L   Potassium 3.7 3.5 - 5.1 mmol/L   Chloride 109 101 - 111 mmol/L   CO2 19 (L) 22 - 32 mmol/L   Glucose, Bld 108 (H) 65 - 99 mg/dL   BUN 17 6 - 20 mg/dL   Creatinine, Ser 0.77 0.44 - 1.00 mg/dL   Calcium 9.0 8.9 - 10.3 mg/dL   Total Protein 7.7 6.5 - 8.1 g/dL   Albumin 4.1 3.5 - 5.0 g/dL   AST 23 15 - 41 U/L   ALT 18 14 - 54 U/L   Alkaline Phosphatase 77 38 - 126 U/L   Total Bilirubin 0.5 0.3 - 1.2 mg/dL   GFR calc non Af Amer >60 >60 mL/min  GFR calc Af Amer >60 >60 mL/min    Comment: (NOTE) The eGFR has been calculated using the CKD EPI equation. This calculation has not been validated in all clinical situations. eGFR's persistently <60 mL/min signify possible Chronic Kidney Disease.    Anion gap 12 5 - 15    Comment: Performed at [facility], [address] [place],  [address]  Glucose,  capillary     Status: Abnormal   Collection Time: 02/28/17  7:37 AM  Result Value Ref Range   Glucose-Capillary 132 (H) 65 - 99 mg/dL  Glucose, capillary     Status: Abnormal   Collection Time: 02/28/17 11:27 AM  Result Value Ref Range   Glucose-Capillary 105 (H) 65 - 99 mg/dL   Comment 1 Document in Chart     Blood Alcohol level:  No results found for: [facility]  Metabolic Disorder Labs: Lab Results  Component Value Date   HGBA1C 6.1 (H) 02/27/2017   MPG 128.37 02/27/2017   MPG 125.5 02/22/2017   No results found for: PROLACTIN Lab Results  Component Value Date   CHOL 164 02/27/2017   TRIG 293 (H) 02/27/2017   HDL 41 02/27/2017   CHOLHDL 4.0 02/27/2017   VLDL 59 (H) 02/27/2017   LDLCALC 64 02/27/2017    Physical Findings: AIMS: Facial and Oral Movements Muscles of Facial Expression: None, normal Lips and Perioral Area: None, normal Jaw: None, normal Tongue: None, normal,Extremity Movements Upper (arms, wrists, hands, fingers): None, normal Lower (legs, knees, ankles, toes): None, normal, Trunk Movements Neck, shoulders, hips: None, normal, Overall Severity Severity of abnormal movements (highest score from questions above): None, normal Incapacitation due to abnormal movements: None, normal Patient's awareness of abnormal movements (rate only patient's report): No Awareness, Dental Status Current problems with teeth and/or dentures?: No Does patient usually wear dentures?: No  CIWA:    COWS:     Musculoskeletal: Strength & Muscle Tone: within normal limits Gait & Station: normal, walks with walker due to unsteadiness Patient leans: N/A  Psychiatric Specialty Exam: Physical Exam  ROS  Blood pressure (!) 152/94, pulse 82, temperature 98 F (36.7 C), temperature source Oral, resp. rate 18, height '5\' 7"'  (1.702 m), weight 88.9 kg (196 lb), last menstrual period 02/05/2017, SpO2 99 %.Body mass index is 30.7 kg/m.  General Appearance: Casual  Eye Contact:  Good   Speech:  Clear and Coherent  Volume:  Normal  Mood:  Euthymic  Affect:  Appropriate, laughing  Thought Process:  Coherent and Goal Directed  Orientation:  Full (Time, Place, and Person)  Thought Content:  Logical  Suicidal Thoughts:  No  Homicidal Thoughts:  No  Memory:  Immediate;   Fair  Judgement:  Fair  Insight:  Fair  Psychomotor Activity:  Normal  Concentration:  Concentration: Fair  Recall:  [employer] of Knowledge:  Fair  Language:  Fair  Akathisia:  No      Assets:  Communication Skills Desire for Improvement Resilience  ADL's:  Intact  Cognition:  WNL  Sleep:  Number of Hours: 8     Treatment Plan Summary: 47 yo female admitted due to suicide gesture in response to an argument with her husband. Mood is much better today and she is laughing and joking and social with peers today. Denies SI today until she begins talking about her husband then endorses vague SI. She has very poor coping skills and maladaptive cognitive distortions. She would greatly benefit from intense therapy.   Plan:  MDD  and PTSD -Continue Abilify 5 mg daily. QTc improving at 481 -Continue Remeron 15 mg qhs -Continue Depakote 500 mg BID -She wants to follow up with [facility] on discharge  Nightmares -Continue Clonidine 0.1 mg qhs  HTN -Some medications were lowered yesterday due to hypotension. Her BP is now elevated today. Will increase diltiazem back to 240 mg daily -Continue Losartan 25 mg daily -Continue Metoprolol 25 mg BID -Continue Clonidine 0.1 mg daily  Diabetes -Continue Metformin 1000 mg BID  Hypothyroid -Continue Synthroid 25 mcg -T4 normal  Unsteady gait -PT saw pt yesterday  Headaches -Will switch Topamax to scheduled due to headaches  Recent UTI -UA shows UTI. Will start [place] for 5 days  Dispo -Pt will return home on discharge. She would greatly benefit from DBT interventions. She wants to follow up with [facility]  Marylin Crosby, MD 02/28/2017, 3:56 PM

## 2017-02-28 NOTE — Progress Notes (Signed)
Recreation Therapy Notes  Date: 02.06.2019  Time: 3:00pm  Location: Craft room  Behavioral response: Appropriate   Group Type: Craft  Participation level: Active  Communication: Patient was social with peers and staff during group.    Comments: N/A  Kamaryn Grimley LRT/CTRS        Destynee Stringfellow 02/28/2017 4:06 PM

## 2017-02-28 NOTE — B[facility] Group Notes (Signed)
  02/28/2017  Time: 0930  Type of Therapy/Topic:  Group Therapy:  Emotion Regulation  Participation Level:  Minimal   Description of Group:    The purpose of this group is to assist patients in learning to regulate negative emotions and experience positive emotions. Patients will be guided to discuss ways in which they have been vulnerable to their negative emotions. These vulnerabilities will be juxtaposed with experiences of positive emotions or situations, and patients will be challenged to use positive emotions to combat negative ones. Special emphasis will be placed on coping with negative emotions in conflict situations, and patients will process healthy conflict resolution skills.  Therapeutic Goals: 1. Patient will identify two positive emotions or experiences to reflect on in order to balance out negative emotions 2. Patient will label two or more emotions that they find the most difficult to experience 3. Patient will demonstrate positive conflict resolution skills through discussion and/or role plays  Summary of Patient Progress: Pt continues to work towards their tx goals but has not yet reached them. Pt was able to participate in group discussion, and was able to offer support/validation to other group members. Pt only contributed to group discussion when directly prompted by CSW. When asked a question, pt most often responded with "I don't know." Pt reported she has a difficult time managing her anxiety and depression. Pt was unable to provide a way that she plans to manage her emotions more appropriately upon discharge.   Therapeutic Modalities:   Cognitive Behavioral Therapy Feelings Identification Dialectical Behavioral Therapy   Alden Hipp, MSW, LCSW 02/28/2017 10:23 AM

## 2017-02-28 NOTE — Plan of Care (Signed)
Patient slept for Estimated Hours of 8; Precautionary checks every 15 minutes for safety maintained, room free of safety hazards, patient sustains no injury or falls during this shift.  

## 2017-03-01 LAB — GLUCOSE, CAPILLARY
GLUCOSE-CAPILLARY: 137 mg/dL — AB (ref 65–99)
GLUCOSE-CAPILLARY: 96 mg/dL (ref 65–99)
Glucose-Capillary: 142 mg/dL — ABNORMAL HIGH (ref 65–99)
Glucose-Capillary: 130 mg/dL — ABNORMAL HIGH (ref 65–99)

## 2017-03-01 MED ORDER — CLONIDINE HCL 0.1 MG PO TABS
0.1000 mg | ORAL_TABLET | Freq: Two times a day (BID) | ORAL | Status: DC | PRN
  Administered 2017-03-01: 0.1 mg via ORAL
  Filled 2017-03-01: qty 1

## 2017-03-01 MED ORDER — LOSARTAN POTASSIUM 50 MG PO TABS
50.0000 mg | ORAL_TABLET | Freq: Every day | ORAL | Status: DC
  Administered 2017-03-01 – 2017-03-02 (×2): 50 mg via ORAL
  Filled 2017-03-01: qty 1

## 2017-03-01 MED ORDER — PRAZOSIN HCL 1 MG PO CAPS
1.0000 mg | ORAL_CAPSULE | Freq: Every day | ORAL | Status: DC
  Administered 2017-03-01: 1 mg via ORAL
  Filled 2017-03-01 (×2): qty 1

## 2017-03-01 MED ORDER — METOPROLOL TARTRATE 25 MG PO TABS
50.0000 mg | ORAL_TABLET | Freq: Two times a day (BID) | ORAL | Status: DC
  Administered 2017-03-01 – 2017-03-02 (×2): 50 mg via ORAL
  Filled 2017-03-01 (×2): qty 2

## 2017-03-01 NOTE — Plan of Care (Signed)
Patient slept for Estimated Hours of 7.30; Precautionary checks every 15 minutes for safety maintained, room free of safety hazards, patient sustains no injury or falls during this shift.

## 2017-03-01 NOTE — Plan of Care (Signed)
Progressing Education: Knowledge of [place] Education information/materials will improve 03/01/2017 1807 - Progressing by Rise Mu, RN Emotional status will improve 03/01/2017 1807 - Progressing by Rise Mu, RN Note Bright affect.  Smiles with engagement.  Pleasant and cooperative.  Mental status will improve 03/01/2017 1807 - Progressing by Rise Mu, RN Note Bright affect.  Smiles with engagement.  Pleasant and cooperative. Verbalization of understanding the information provided will improve 03/01/2017 1807 - Progressing by Rise Mu, RN Activity: Interest or engagement in leisure activities will improve 03/01/2017 1807 - Progressing by Rise Mu, RN Note Visible in the milieu.  Interacting with peers and staff appropriately.  Attending groups.   Education: Knowledge of the prescribed therapeutic regimen will improve 03/01/2017 1807 - Progressing by Rise Mu, RN Coping: Ability to cope will improve 03/01/2017 1807 - Progressing by Rise Mu, RN Ability to verbalize feelings will improve 03/01/2017 1807 - Progressing by Rise Mu, RN Note Verbalizes feelings without any difficulty.  Health Behavior/Discharge Planning: Compliance with therapeutic regimen will improve 03/01/2017 1807 - Progressing by Rise Mu, RN Note Agrees with treatment regiment Safety: Ability to disclose and discuss suicidal ideas will improve 03/01/2017 1807 - Progressing by Rise Mu, RN Note Denies SI Self-Concept: Ability to verbalize positive feelings about self will improve 03/01/2017 1807 - Progressing by Rise Mu, RN Coping: Level of anxiety will decrease 03/01/2017 1807 - Progressing by Rise Mu, RN Safety: Ability to remain free from injury will improve 03/01/2017 1807 - Progressing by Rise Mu, RN Note Remains safe on the unit Skin Integrity: Risk for impaired skin integrity will decrease 03/01/2017 1807 -  Progressing by Rise Mu, RN Note No broken skin areas   Progressing Education: Knowledge of [place] Education information/materials will improve 03/01/2017 1807 - Progressing by Rise Mu, RN Emotional status will improve 03/01/2017 1807 - Progressing by Rise Mu, RN Note Bright affect.  Smiles with engagement.  Pleasant and cooperative.  Mental status will improve 03/01/2017 1807 - Progressing by Rise Mu, RN Note Bright affect.  Smiles with engagement.  Pleasant and cooperative. Verbalization of understanding the information provided will improve 03/01/2017 1807 - Progressing by Rise Mu, RN Activity: Interest or engagement in leisure activities will improve 03/01/2017 1807 - Progressing by Rise Mu, RN Note Visible in the milieu.  Interacting with peers and staff appropriately.  Attending groups.   Education: Knowledge of the prescribed therapeutic regimen will improve 03/01/2017 1807 - Progressing by Rise Mu, RN Coping: Ability to cope will improve 03/01/2017 1807 - Progressing by Rise Mu, RN Ability to verbalize feelings will improve 03/01/2017 1807 - Progressing by Rise Mu, RN Note Verbalizes feelings without any difficulty.  Health Behavior/Discharge Planning: Compliance with therapeutic regimen will improve 03/01/2017 1807 - Progressing by Rise Mu, RN Note Agrees with treatment regiment Safety: Ability to disclose and discuss suicidal ideas will improve 03/01/2017 1807 - Progressing by Rise Mu, RN Note Denies SI Self-Concept: Ability to verbalize positive feelings about self will improve 03/01/2017 1807 - Progressing by Rise Mu, RN Coping: Level of anxiety will decrease 03/01/2017 1807 - Progressing by Rise Mu, RN Safety: Ability to remain free from injury will improve 03/01/2017 1807 - Progressing by Rise Mu, RN Note Remains safe on the unit Skin Integrity: Risk  for impaired skin integrity will decrease 03/01/2017 1807 - Progressing by Alson Mcpheeters,  Victorino Sparrow, RN Note No broken skin areas   Education: Knowledge of [place] Education information/materials will improve 03/01/2017 1807 - Progressing by Rise Mu, RN   Adequate for Discharge Education: Knowledge of [facility] Education information will improve 03/01/2017 1807 - Adequate for Discharge by Rise Mu, RN   Skin Integrity: Risk for impaired skin integrity will decrease 03/01/2017 1807 - Progressing by Rise Mu, RN Note No broken skin areas   Safety: Ability to remain free from injury will improve 03/01/2017 1807 - Progressing by Rise Mu, RN Note Remains safe on the unit

## 2017-03-01 NOTE — BHH Group Notes (Signed)
03/01/2017 9:30AM  Type of Therapy/Topic:  Group Therapy:  Balance in Life  Participation Level:  Active  Description of Group:   This group will address the concept of balance and how it feels and looks when one is unbalanced. Patients will be encouraged to process areas in their lives that are out of balance and identify reasons for remaining unbalanced. Facilitators will guide patients in utilizing problem-solving interventions to address and correct the stressor making their life unbalanced. Understanding and applying boundaries will be explored and addressed for obtaining and maintaining a balanced life. Patients will be encouraged to explore ways to assertively make their unbalanced needs known to significant others in their lives, using other group members and facilitator for support and feedback.  Therapeutic Goals: 1. Patient will identify two or more emotions or situations they have that consume much of in their lives. 2. Patient will identify signs/triggers that life has become out of balance:  3. Patient will identify two ways to set boundaries in order to achieve balance in their lives:  4. Patient will demonstrate ability to communicate their needs through discussion and/or role plays  Summary of Patient Progress: Actively and appropriately engaged in the group. Patient was able to provide support and validation to other group members.Patient practiced active listening when interacting with the facilitator and other group members Patient in still in the process of obtaining treatment goals. Veverly says that her children consumes much of her life. She talked about all of the different scheduled that the children have and how it causes her to become overwhelmed. She says "I don't ever get time for myself."    Therapeutic Modalities:   Cognitive Behavioral Therapy Solution-Focused Therapy Assertiveness Training  Darin Engels, [place]

## 2017-03-01 NOTE — Progress Notes (Signed)
[facility] MD Progress Note  03/01/2017 12:49 PM Meredith Cherry  MRN:  [id number] Subjective:  Pt is in bright spirits today. She is laughing and joking. She is social with peers. She had a rough night last night she states. She had a migraine and her BP was elevated. She laughs and states, "I almost had to go to the ER last night." She is feeling better today although she has slight headache. She states taht she is going to all groups and really helpful. She states, "Everyone is so nice and funny here." She had an argument with her husband last night which is hard on her. She still has a lot of issues with her relationship with him. She states that he works all weekend and she will be home with the kids. We discussed that the weather will be nice and she may take them somewhere outside. She did not endorse SI or any thoughts of self harm. She still wants to follow up with [facility] in [place] and states that she knows exactly where to go and plans to go there on discharge. She states that the unit if very triggering because it is loud and people are slamming the doors.   Principal Problem: PTSD (post-traumatic stress disorder) Diagnosis:   Patient Active Problem List   Diagnosis Date Noted  . PTSD (post-traumatic stress disorder) [F43.10] 02/27/2017    Priority: High  . Major depressive disorder, recurrent severe without psychotic features ([place]) [F33.2] 02/26/2017    Priority: High  . Borderline personality disorder ([place]) [F60.3] 02/27/2017    Priority: Medium  . Diabetes ([place]) [E11.9] 02/26/2017  . Migraine [G43.909] 02/26/2017  . Hypertensive urgency [I16.0] 02/23/2017  . Uncontrolled hypertension [I10] 02/22/2017  . Bipolar affect, depressed ([place]) [F31.30] 02/19/2017  . History of Chiari malformation [Z86.69] 11/15/2015  . 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 Prolonged QT interval [R94.31] 11/15/2015  . Panic attack [F41.0]   . Hypertension [I10]   . GERD (gastroesophageal reflux disease)  [K21.9]   . Depression [F32.9]   . Anxiety [F41.9]   . ADHD (attention deficit hyperactivity disorder) [F90.9]   . Acute encephalopathy [G93.40]   . 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 Snoring [R06.83] 12/07/2011   Total Time spent with patient: 20 minutes  Past Psychiatric History: See H&P  Past Medical History:  Past Medical History:  Diagnosis Date  . ADHD (attention deficit hyperactivity disorder)   . Allergic rhinitis   . Anxiety   . Asthma   . Bipolar 1 disorder ([place])   . Chiari malformation type I ([place])   . Chronic migraine   . Depression   . Diabetes mellitus without complication ([place])   . GERD (gastroesophageal reflux disease)   . Hypertension   . Panic attack   . Psychiatric disorder     Past Surgical History:  Procedure Laterality Date  . CHOLECYSTECTOMY    . TUBAL LIGATION     Family History:  Family History  Problem Relation Age of Onset  . Stroke Mother        x 5  . Hypertension Mother   . Heart disease Mother   . Diabetes Mother   . Diabetes Father   . Heart disease Maternal Grandmother   . Breast cancer Paternal Grandmother   . Asthma Brother    Family Psychiatric  History: See H&P Social History:  Social History   Substance and Sexual Activity  Alcohol Use No     Social History   Substance and Sexual Activity  Drug Use No    Social History   Socioeconomic History  . Marital status: Married    Spouse name: None  . Number of children: 2  . Years of education: None  . Highest education level: None  Social Needs  . Financial resource strain: None  . Food insecurity - worry: None  . Food insecurity - inability: None  . Transportation needs - medical: None  . Transportation needs - non-medical: None  Occupational History  . Occupation: unemployed  Tobacco Use  . Smoking status: Former Smoker    Types: Cigarettes  . Smokeless tobacco: Never Used  Substance and Sexual Activity  . Alcohol use: No  . Drug use: No  . Sexual  activity: None  Other Topics Concern  . None  Social History Narrative  . None   Additional Social History:                         Sleep: Good  Appetite:  Good  Current Medications: Current Facility-Administered Medications  Medication Dose Route Frequency Provider Last Rate Last Dose  . acetaminophen (TYLENOL) tablet 650 mg  650 mg Oral Q6H PRN Pucilowska, Jolanta B, MD   650 mg at 02/28/17 1645  . alum & mag hydroxide-simeth (MAALOX/MYLANTA) 200-200-20 MG/5ML suspension 30 mL  30 mL Oral Q4H PRN Pucilowska, Jolanta B, MD      . ARIPiprazole (ABILIFY) tablet 5 mg  5 mg Oral Daily Karder Goodin, Tyson Babinski, MD   5 mg at 03/01/17 0810  . aspirin EC tablet 81 mg  81 mg Oral Daily Pucilowska, Jolanta B, MD   81 mg at 03/01/17 0811  . clonazePAM (KLONOPIN) tablet 0.5 mg  0.5 mg Oral BID PRN Marylin Crosby, MD   0.5 mg at 02/28/17 2317  . cloNIDine (CATAPRES) tablet 0.1 mg  0.1 mg Oral QHS Kanishk Stroebel, Tyson Babinski, MD   0.1 mg at 02/28/17 2212  . cloNIDine (CATAPRES) tablet 0.1 mg  0.1 mg Oral BID PRN Judieth Mckown, Tyson Babinski, MD      . diltiazem (CARDIZEM CD) 24 hr capsule 240 mg  240 mg Oral Daily Winni Ehrhard, Tyson Babinski, MD   240 mg at 03/01/17 [phone]  . divalproex (DEPAKOTE) DR tablet 500 mg  500 mg Oral Q12H Pucilowska, Jolanta B, MD   500 mg at 03/01/17 0811  . insulin aspart (novoLOG) injection 0-5 Units  0-5 Units Subcutaneous QHS Pucilowska, Jolanta B, MD      . insulin aspart (novoLOG) injection 0-9 Units  0-9 Units Subcutaneous TID WC Pucilowska, Jolanta B, MD   1 Units at 03/01/17 1137  . levothyroxine (SYNTHROID, LEVOTHROID) tablet 25 mcg  25 mcg Oral QAC breakfast Marylin Crosby, MD   25 mcg at 03/01/17 [phone]  . [START ON 03/02/2017] losartan (COZAAR) tablet 50 mg  50 mg Oral Daily Sterling Mondo, Tyson Babinski, MD   50 mg at 03/01/17 0811  . magnesium hydroxide (MILK OF MAGNESIA) suspension 30 mL  30 mL Oral Daily PRN Pucilowska, Jolanta B, MD      . metFORMIN (GLUCOPHAGE) tablet 1,000 mg  1,000 mg Oral BID WC Pucilowska,  Jolanta B, MD   1,000 mg at 03/01/17 0811  . metoprolol tartrate (LOPRESSOR) tablet 50 mg  50 mg Oral BID Brayln Duque R, MD      . mirtazapine (REMERON) tablet 15 mg  15 mg Oral QHS Darrio Bade, Tyson Babinski, MD   15 mg at 02/28/17 2213  . nitrofurantoin (macrocrystal-monohydrate) (MACROBID)  capsule 100 mg  100 mg Oral Q12H Daquawn Seelman, Tyson Babinski, MD   100 mg at 03/01/17 0811  . pravastatin (PRAVACHOL) tablet 20 mg  20 mg Oral q1800 Pucilowska, Jolanta B, MD   20 mg at 02/28/17 1809  . prazosin (MINIPRESS) capsule 1 mg  1 mg Oral QHS Louanna Vanliew R, MD      . promethazine (PHENERGAN) injection 25 mg  25 mg Intramuscular Q8H PRN Pucilowska, Jolanta B, MD   25 mg at 02/28/17 2353  . SUMAtriptan (IMITREX) injection 6 mg  6 mg Subcutaneous Q2H PRN Pucilowska, Jolanta B, MD   6 mg at 02/28/17 2352  . SUMAtriptan (IMITREX) tablet 100 mg  100 mg Oral Q2H PRN Pucilowska, Jolanta B, MD   100 mg at 02/28/17 2217  . topiramate (TOPAMAX) tablet 100 mg  100 mg Oral Daily Harlie Buening, Tyson Babinski, MD   100 mg at 03/01/17 7948    Lab Results:  Results for orders placed or performed during the hospital encounter of 02/26/17 (from the past 48 hour(s))  Glucose, capillary     Status: Abnormal   Collection Time: 02/27/17  4:32 PM  Result Value Ref Range   Glucose-Capillary 115 (H) 65 - 99 mg/dL  Glucose, capillary     Status: None   Collection Time: 02/27/17  8:31 PM  Result Value Ref Range   Glucose-Capillary 94 65 - 99 mg/dL   Comment 1 Notify RN   T4, free     Status: None   Collection Time: 02/28/17  6:54 AM  Result Value Ref Range   Free T4 0.67 0.61 - 1.12 ng/dL    Comment: (NOTE) Biotin ingestion may interfere with free T4 tests. If the results are inconsistent with the TSH level, previous test results, or the clinical presentation, then consider biotin interference. If needed, order repeat testing after stopping biotin. Performed at [facility], [address] [place], [place] [address]   CBC     Status:  Abnormal   Collection Time: 02/28/17  6:54 AM  Result Value Ref Range   WBC 8.3 3.6 - 11.0 K/uL   RBC 4.11 3.80 - 5.20 MIL/uL   Hemoglobin 11.7 (L) 12.0 - 16.0 g/dL   HCT 35.5 35.0 - 47.0 %   MCV 86.3 80.0 - 100.0 fL   MCH 28.5 26.0 - 34.0 pg   MCHC 33.0 32.0 - 36.0 g/dL   RDW 14.7 (H) 11.5 - 14.5 %   Platelets 467 (H) 150 - 440 K/uL    Comment: Performed at [facility], [address] [place], [place] [address]  Comprehensive metabolic panel     Status: Abnormal   Collection Time: 02/28/17  6:54 AM  Result Value Ref Range   Sodium 140 135 - 145 mmol/L   Potassium 3.7 3.5 - 5.1 mmol/L   Chloride 109 101 - 111 mmol/L   CO2 19 (L) 22 - 32 mmol/L   Glucose, Bld 108 (H) 65 - 99 mg/dL   BUN 17 6 - 20 mg/dL   Creatinine, Ser 0.77 0.44 - 1.00 mg/dL   Calcium 9.0 8.9 - 10.3 mg/dL   Total Protein 7.7 6.5 - 8.1 g/dL   Albumin 4.1 3.5 - 5.0 g/dL   AST 23 15 - 41 U/L   ALT 18 14 - 54 U/L   Alkaline Phosphatase 77 38 - 126 U/L   Total Bilirubin 0.5 0.3 - 1.2 mg/dL   GFR calc non Af Amer >60 >60 mL/min  GFR calc Af Amer >60 >60 mL/min    Comment: (NOTE) The eGFR has been calculated using the CKD EPI equation. This calculation has not been validated in all clinical situations. eGFR's persistently <60 mL/min signify possible Chronic Kidney Disease.    Anion gap 12 5 - 15    Comment: Performed at [facility], [address] [place], [place] [address]  Glucose, capillary     Status: Abnormal   Collection Time: 02/28/17  7:37 AM  Result Value Ref Range   Glucose-Capillary 132 (H) 65 - 99 mg/dL  Glucose, capillary     Status: Abnormal   Collection Time: 02/28/17 11:27 AM  Result Value Ref Range   Glucose-Capillary 105 (H) 65 - 99 mg/dL   Comment 1 Document in Chart   Glucose, capillary     Status: Abnormal   Collection Time: 02/28/17  4:36 PM  Result Value Ref Range   Glucose-Capillary 112 (H) 65 - 99 mg/dL  Glucose, capillary     Status: Abnormal    Collection Time: 02/28/17 10:08 PM  Result Value Ref Range   Glucose-Capillary 104 (H) 65 - 99 mg/dL  Glucose, capillary     Status: Abnormal   Collection Time: 03/01/17  7:21 AM  Result Value Ref Range   Glucose-Capillary 142 (H) 65 - 99 mg/dL  Glucose, capillary     Status: Abnormal   Collection Time: 03/01/17 11:10 AM  Result Value Ref Range   Glucose-Capillary 130 (H) 65 - 99 mg/dL   Comment 1 Notify RN     Blood Alcohol level:  No results found for: [facility]  Metabolic Disorder Labs: Lab Results  Component Value Date   HGBA1C 6.1 (H) 02/27/2017   MPG 128.37 02/27/2017   MPG 125.5 02/22/2017   No results found for: PROLACTIN Lab Results  Component Value Date   CHOL 164 02/27/2017   TRIG 293 (H) 02/27/2017   HDL 41 02/27/2017   CHOLHDL 4.0 02/27/2017   VLDL 59 (H) 02/27/2017   LDLCALC 64 02/27/2017    Physical Findings: AIMS: Facial and Oral Movements Muscles of Facial Expression: None, normal Lips and Perioral Area: None, normal Jaw: None, normal Tongue: None, normal,Extremity Movements Upper (arms, wrists, hands, fingers): None, normal Lower (legs, knees, ankles, toes): None, normal, Trunk Movements Neck, shoulders, hips: None, normal, Overall Severity Severity of abnormal movements (highest score from questions above): None, normal Incapacitation due to abnormal movements: None, normal Patient's awareness of abnormal movements (rate only patient's report): No Awareness, Dental Status Current problems with teeth and/or dentures?: No Does patient usually wear dentures?: No  CIWA:    COWS:     Musculoskeletal: Strength & Muscle Tone: within normal limits Gait & Station: normal Patient leans: N/A  Psychiatric Specialty Exam: Physical Exam  Nursing note and vitals reviewed.   Review of Systems  All other systems reviewed and are negative.   Blood pressure (!) 162/107, pulse 94, temperature 98 F (36.7 C), temperature source Oral, resp. rate 18, height 5'  7" (1.702 m), weight 88.9 kg (196 lb), last menstrual period 02/05/2017, SpO2 100 %.Body mass index is 30.7 kg/m.  General Appearance: Casual  Eye Contact:  Good  Speech:  Clear and Coherent  Volume:  Normal  Mood:  Euthymic  Affect:  Congruent  Thought Process:  Coherent and Goal Directed  Orientation:  Full (Time, Place, and Person)  Thought Content:  Logical  Suicidal Thoughts:  No  Homicidal Thoughts:  No  Memory:  Immediate;  Fair  Judgement:  Impaired  Insight:  Fair  Psychomotor Activity:  Normal  Concentration:  Concentration: Fair  Recall:  [employer] of Knowledge:  Fair  Language:  Fair  Akathisia:  No      Assets:  Housing Resilience  ADL's:  Intact  Cognition:  WNL  Sleep:  Number of Hours: 7.3     Treatment Plan Summary: 47 yo female admitted due to suicide gesture in response to argument with her husband which is ongoing. Mood is good today, she is socializing with peers, laughing and joking. Did not endorse any SI today. She did have hypertension last night so BP medications will be increased back to original dosing.   Plan:  MDD and PTSD -Continue Abilify 5 mg daily -Continue Remeron 15 mg qhs -Continue Depakote 500 mg BID  Nightmares -Will restart Prazosin 1 mg which was her initial medication but held due to hypotension  HTN -Continue diltiazem 240 mg daily -Increase Losartan back to 50 mg daily -Increase Metoprolol back to 50 mg BID -Restart Prazosin at 1 mg qhs -Continue Clonidine 0.1 mg qhs and have it available prn for elevated BP  Diabetes -Continue Metformin 1000 mg BID  Hypothyroid -Continue synthroid 25 mcg  Headaches -Continue prn Imitrex  Recent UTI -Continue Macrobid  Dispo =She will return home on discharge and she would like husband to pick her up. She wants to follow up with [facility] and will go on her own.   Marylin Crosby, MD 03/01/2017, 12:49 PM

## 2017-03-01 NOTE — Progress Notes (Signed)
Recreation Therapy Notes  Date: 02.07.2019  Time: 1:00 PM  Location: Craft Room  Behavioral response: Appropriate   Intervention Topic: Problem Solving   Discussion/Intervention: Group content on today was focused on problem solving. The group described what problem solving is. Patients expressed how problems affect them and how they deal with problems. Individuals identified healthy ways to deal with problems. Patients explained what normally happens to them when they do not deal with problems. The group expressed reoccurring problems for them. The group participated in the intervention "Ways to Solve problems" where patients were given a chance to explore different ways to solve problems.  Clinical Observations/Feedback: Patient came to group and stated that problems are big issues. She stated if we do not deal with our problems they become bigger. Individual explained that she can deal with her problems in the future by witting things down. Patient was social with peers and staff while participating in the intervention. [name] LRT/CTRS         Toba Claudio 03/01/2017 2:13 PM

## 2017-03-01 NOTE — Progress Notes (Signed)
PT Cancellation Note  Patient Details Name: Meredith Cherry MRN: [id number] DOB: 06-28-1970   Cancelled Treatment:    Reason Eval/Treat Not Completed: Patient not medically ready   Chart reviewed.  Held session today due to high BP.  Will continue as appropriate.   Chesley Noon 03/01/2017, 12:00 PM

## 2017-03-01 NOTE — [facility] Group Notes (Signed)
LCSW Group Therapy Note 03/01/2017 9:00 AM  Type of Therapy and Topic:  Group Therapy:  Setting Goals  Participation Level:  Minimal  Description of Group: In this process group, patients discussed using strengths to work toward goals and address challenges.  Patients identified two positive things about themselves and one goal they were working on.  Patients were given the opportunity to share openly and support each other's plan for self-empowerment.  The group discussed the value of gratitude and were encouraged to have a daily reflection of positive characteristics or circumstances.  Patients were encouraged to identify a plan to utilize their strengths to work on current challenges and goals.  Therapeutic Goals 1. Patient will verbalize personal strengths/positive qualities and relate how these can assist with achieving desired personal goals 2. Patients will verbalize affirmation of peers plans for personal change and goal setting 3. Patients will explore the value of gratitude and positive focus as related to successful achievement of goals 4. Patients will verbalize a plan for regular reinforcement of personal positive qualities and circumstances.  Summary of Patient Progress: Meredith Cherry did not contribute much to Goals group today due to not feel very well physically.  Meredith Cherry stated that she has been dealing with a migraine since yesterday.  When asked by CSW if she understood how she can develop her own SMART goals based on informed shared by CSW and input by other group participants, she shared that she did.  Meredith Cherry was able to share that her goal for today is "talk with my doctor about getting rid of my migraine"      Therapeutic Modalities [place], LCSW 03/01/2017 10:21 AM

## 2017-03-01 NOTE — Progress Notes (Signed)
Patient ID: Meredith Cherry, female   DOB: 1970-10-19, 47 y.o.   MRN: [id number] Observed in room resting in bed, spontaneous eyes opening to knock on the door; patient holding on to head, pitiful, miserable, "my husband told me, you don't, ever, have to come home anymore, we ar doing fine without you.." Patent was encouraged to focus here and now.  Elevated BP=194/109, PR=93; Catapres 0.1 mg given @2212 . @ 2217 - Pt c/o Migraine Headache 10/10, Imitrex 100 mg given @ 2255 - Pt was nauseated, voided on the bathroom floor before she could get to toilet bow. @ 2300 - BP trended up to 201/133, PR=100, RR=22; Symptomatic, afebrile. Dr. Mamie Nick returned page promptly, change in condition discussed, chart reviewed with Physician New orders and medication adjustments acknowledged. Phenergan 25 mg IM and Imitrex 6 mg IM given into the deep muscle @ Left Ventrogluteal Site Patient was informed that Cardizem (CCB) is now available to manage her BP and Topamax for migraine headache. @ 2356 - BP=152/89, PR=90 otherwise VSS Remains on fall precautions and at eyesight from the nurses' station.

## 2017-03-02 LAB — GLUCOSE, CAPILLARY
GLUCOSE-CAPILLARY: 109 mg/dL — AB (ref 65–99)
Glucose-Capillary: 124 mg/dL — ABNORMAL HIGH (ref 65–99)

## 2017-03-02 MED ORDER — ARIPIPRAZOLE 5 MG PO TABS: 5.0000 mg | ORAL_TABLET | Freq: Every day | ORAL | 0 refills | Status: DC

## 2017-03-02 MED ORDER — PRAZOSIN HCL 1 MG PO CAPS: 1.0000 mg | ORAL_CAPSULE | Freq: Every day | ORAL | 0 refills | Status: DC

## 2017-03-02 MED ORDER — LOSARTAN POTASSIUM 50 MG PO TABS: 50.0000 mg | ORAL_TABLET | Freq: Every day | ORAL | 0 refills | Status: DC

## 2017-03-02 MED ORDER — CLONIDINE HCL 0.1 MG PO TABS: 0.1000 mg | ORAL_TABLET | Freq: Two times a day (BID) | ORAL | 0 refills | Status: DC

## 2017-03-02 MED ORDER — NITROFURANTOIN MONOHYD MACRO 100 MG PO CAPS: 100.0000 mg | ORAL_CAPSULE | Freq: Two times a day (BID) | ORAL | 0 refills | Status: AC

## 2017-03-02 MED ORDER — CLONIDINE HCL 0.1 MG PO TABS: 0.1000 mg | ORAL_TABLET | Freq: Three times a day (TID) | ORAL | 0 refills | Status: DC

## 2017-03-02 MED ORDER — PRAVASTATIN SODIUM 20 MG PO TABS: 20.0000 mg | ORAL_TABLET | Freq: Every day | ORAL | 0 refills | Status: DC

## 2017-03-02 MED ORDER — MIRTAZAPINE 15 MG PO TABS: 15.0000 mg | ORAL_TABLET | Freq: Every day | ORAL | 0 refills | Status: DC

## 2017-03-02 MED ORDER — LEVOTHYROXINE SODIUM 25 MCG PO TABS: 25.0000 ug | ORAL_TABLET | Freq: Every day | ORAL | 0 refills | Status: DC

## 2017-03-02 MED ORDER — DILTIAZEM HCL ER COATED BEADS 240 MG PO CP24: 240.0000 mg | ORAL_CAPSULE | Freq: Every day | ORAL | 0 refills | Status: DC

## 2017-03-02 MED ORDER — DIVALPROEX SODIUM 500 MG PO DR TAB: 500.0000 mg | DELAYED_RELEASE_TABLET | Freq: Two times a day (BID) | ORAL | 0 refills | Status: DC

## 2017-03-02 MED ORDER — METOPROLOL TARTRATE 50 MG PO TABS: 50.0000 mg | ORAL_TABLET | Freq: Two times a day (BID) | ORAL | 0 refills | Status: DC

## 2017-03-02 MED ORDER — SUMATRIPTAN SUCCINATE 100 MG PO TABS: 100.0000 mg | ORAL_TABLET | ORAL | 0 refills | Status: DC | PRN

## 2017-03-02 NOTE — Progress Notes (Signed)
Patient is scheduled for discharge this am. Affect is bright and pleasant. Actively participates in group sessions, some complaints of pain to back but states that the medications where effective that was given for pain. Patient will leave with prescriptions for medications, personal items and discharge paperwork. Milieu remains safe with q 15 minute safety checks.

## 2017-03-02 NOTE — Plan of Care (Signed)
Patient slept for Estimated Hours of 7.45; Precautionary checks every 15 minutes for safety maintained, room free of safety hazards, patient sustains no injury or falls during this shift.  

## 2017-03-02 NOTE — Progress Notes (Addendum)
Patient ID: Meredith Cherry, female   DOB: 05-15-1970, 47 y.o.   MRN: [id number]  Improved mood and affect, observed in the day room watching TV with peers, stated that Migraine headache was not as bad, denied SI/HI/SIB/AVH. CBG=96

## 2017-03-02 NOTE — Progress Notes (Signed)
[place] led afternoon group for patients. Patient was present for first part of group. Patient was paged to nurses desk. Patient stated she was being discharged. Patient asked that counseling information be forwarded to her.

## 2017-03-02 NOTE — [facility] Group Notes (Signed)
03/02/2017 1PM  Type of Therapy and Topic:  Group Therapy:  Feelings around Relapse and Recovery  Participation Level:  Active   Description of Group:    Patients in this group will discuss emotions they experience before and after a relapse. They will process how experiencing these feelings, or avoidance of experiencing them, relates to having a relapse. Facilitator will guide patients to explore emotions they have related to recovery. Patients will be encouraged to process which emotions are more powerful. They will be guided to discuss the emotional reaction significant others in their lives may have to patients' relapse or recovery. Patients will be assisted in exploring ways to respond to the emotions of others without this contributing to a relapse.  Therapeutic Goals: 1. Patient will identify two or more emotions that lead to a relapse for them 2. Patient will identify two emotions that result when they relapse 3. Patient will identify two emotions related to recovery 4. Patient will demonstrate ability to communicate their needs through discussion and/or role plays   Summary of Patient Progress: Actively and appropriately engaged in the group. Patient was able to provide support and validation to other group members.Patient practiced active listening when interacting with the facilitator and other group members Patient in still in the process of obtaining treatment goals. Patient states that her recovery plan includes "praticing deep breathing and figuring out how to get out of my relationship with my husband."    Therapeutic Modalities:   Cognitive Behavioral Therapy Solution-Focused Therapy Assertiveness Training Relapse Prevention Therapy   Darin Engels, [place] 03/02/2017 1:56 PM
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
## 2017-03-02 NOTE — Progress Notes (Signed)
Patient discharged on above date and time. Discharge paperwork signed. Patient verbally stated that she understood discharge information provided to her by RN. Picked up by her husband and plans to follow up with [facility]. No complaints of voiced.

## 2017-03-02 NOTE — BHH Suicide Risk Assessment (Signed)
[facility] Discharge Suicide Risk Assessment   Principal Problem: PTSD (post-traumatic stress disorder) Discharge Diagnoses:  Patient Active Problem List   Diagnosis Date Noted  . PTSD (post-traumatic stress disorder) [F43.10] 02/27/2017    Priority: High  . Major depressive disorder, recurrent severe without psychotic features ([place]) [F33.2] 02/26/2017    Priority: High  . Borderline personality disorder ([place]) [F60.3] 02/27/2017    Priority: Medium  . Diabetes ([place]) [E11.9] 02/26/2017  . Migraine [G43.909] 02/26/2017  . Hypertensive urgency [I16.0] 02/23/2017  . Uncontrolled hypertension [I10] 02/22/2017  . Bipolar affect, depressed ([place]) [F31.30] 02/19/2017  . History of Chiari malformation [Z86.69] 11/15/2015  . 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 Prolonged QT interval [R94.31] 11/15/2015  . Panic attack [F41.0]   . Hypertension [I10]   . GERD (gastroesophageal reflux disease) [K21.9]   . Depression [F32.9]   . Anxiety [F41.9]   . ADHD (attention deficit hyperactivity disorder) [F90.9]   . Acute encephalopathy [G93.40]   . 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 Snoring [R06.83] 12/07/2011    Mental Status Per Nursing Assessment::   On Admission:     Demographic Factors:  Caucasian  Loss Factors: Conflict with relationship  Historical Factors: Prior suicide attempts and Impulsivity  Risk Reduction Factors:   Responsible for children under 58 years of age and Living with another person, especially a relative  Continued Clinical Symptoms:  Personality Disorders:   Cluster B  Cognitive Features That Contribute To Risk:  None    Suicide Risk:  Minimal Acute Risk: No identifiable suicidal ideation.  Patients presenting with no risk factors but with morbid ruminations; may be classified as minimal risk based on the severity of the depressive symptoms. Elevated chronic risk.   Follow-up Information    [facility]. Go on 03/08/2017.   Why:   Please go to your hospital follow up appointment on Thursday, 03/08/17 at 3:30PM. Thank you! Contact information: [address] [place], [place] [address]  [phone] F: [phone]       [facility]. Go to.   Why:  As needed, you can go to [facility] for peer-led group services on the following days: Monday-Thursday: 9AM-4:30PM and Friday 9AM-2:30PM.  Contact information: [address] [place], [place] [address]  P: [phone]          Plan Of Care/Follow-up recommendations:  Follow up with Jinny Blossom and [place], MD 03/02/2017, 9:52 AM

## 2017-03-02 NOTE — Progress Notes (Signed)
  [facility] Adult Case Management Discharge Plan :  Will you be returning to the same living situation after discharge:  Yes,  returning home. At discharge, do you have transportation home?: Yes,  pt's husband.  Do you have the ability to pay for your medications: Yes,  insurance.  Release of information consent forms completed and in the chart;  Patient's signature needed at discharge.  Patient to Follow up at: Follow-up Information    [facility]. Go on 03/08/2017.   Why:  Please go to your hospital follow up appointment on Thursday, 03/08/17 at 3:30PM. Thank you! Contact information: [place], [place] [address]  [phone] F: [phone]       [facility]. Go to.   Why:  As needed, you can go to [facility] for peer-led group services on the following days: Monday-Thursday: 9AM-4:30PM and Friday 9AM-2:30PM.  Contact information: [address] [place], [place] [address]  P: [phone]          Next level of care provider has access to [place] and Suicide Prevention discussed: Yes,  with pt and her husband.  Have you used any form of tobacco in the last 30 days? (Cigarettes, Smokeless Tobacco, Cigars, and/or Pipes): No  Has patient been referred to the Quitline?: N/A patient is not a smoker  Patient has been referred for addiction treatment: N/A  Alden Hipp, LCSW 03/02/2017, 8:12 AM

## 2017-03-27 DIAGNOSIS — Z0000 Encounter for general adult medical examination without abnormal findings: Secondary | ICD-10-CM | POA: Insufficient documentation

## 2017-04-04 DIAGNOSIS — I471 Supraventricular tachycardia, unspecified: Secondary | ICD-10-CM

## 2017-04-04 DIAGNOSIS — E782 Mixed hyperlipidemia: Secondary | ICD-10-CM | POA: Insufficient documentation

## 2017-04-04 HISTORY — DX: Supraventricular tachycardia: I47.1

## 2017-04-04 HISTORY — DX: Supraventricular tachycardia, unspecified: I47.10

## 2017-04-17 DIAGNOSIS — J029 Acute pharyngitis, unspecified: Secondary | ICD-10-CM | POA: Insufficient documentation

## 2017-05-17 DIAGNOSIS — I952 Hypotension due to drugs: Secondary | ICD-10-CM

## 2017-05-17 HISTORY — DX: Hypotension due to drugs: I95.2

## 2017-06-11 DIAGNOSIS — Z765 Malingerer [conscious simulation]: Secondary | ICD-10-CM | POA: Insufficient documentation

## 2017-07-11 DIAGNOSIS — R82998 Other abnormal findings in urine: Secondary | ICD-10-CM | POA: Insufficient documentation

## 2017-09-25 DIAGNOSIS — M545 Low back pain, unspecified: Secondary | ICD-10-CM | POA: Insufficient documentation

## 2017-11-12 DIAGNOSIS — Z09 Encounter for follow-up examination after completed treatment for conditions other than malignant neoplasm: Secondary | ICD-10-CM

## 2017-11-12 HISTORY — DX: Encounter for follow-up examination after completed treatment for conditions other than malignant neoplasm: Z09

## 2017-12-12 DIAGNOSIS — K625 Hemorrhage of anus and rectum: Secondary | ICD-10-CM

## 2017-12-12 HISTORY — DX: Hemorrhage of anus and rectum: K62.5

## 2018-05-22 DIAGNOSIS — R0789 Other chest pain: Secondary | ICD-10-CM | POA: Insufficient documentation

## 2018-05-22 HISTORY — DX: Other chest pain: R07.89

## 2018-06-14 DIAGNOSIS — E871 Hypo-osmolality and hyponatremia: Secondary | ICD-10-CM

## 2018-06-14 DIAGNOSIS — E876 Hypokalemia: Secondary | ICD-10-CM | POA: Insufficient documentation

## 2018-06-14 DIAGNOSIS — K529 Noninfective gastroenteritis and colitis, unspecified: Secondary | ICD-10-CM

## 2018-06-14 HISTORY — DX: Noninfective gastroenteritis and colitis, unspecified: K52.9

## 2018-06-14 HISTORY — DX: Hypo-osmolality and hyponatremia: E87.1

## 2018-06-14 HISTORY — DX: Hypokalemia: E87.6

## 2018-06-20 IMAGING — MR MR HEAD WO/W CM
14 of 15 series · 39 of 48 positions shown · IV contrast (multihance)
Comparison: CT head 11/14/2015 within normal limits. MR brain
08/16/2015. CTA head neck 10/09/2015.

CLINICAL DATA: Altered mental status, possible seizures.
Unresponsive in the emergency department. History of pseudoseizures.
History of panic attacks and ADHD.

EXAM:
MRI HEAD WITHOUT AND WITH CONTRAST
TECHNIQUE: Multiplanar, multiecho pulse sequences of the brain and surrounding
structures were obtained without and with intravenous contrast.
CONTRAST:  20mL MULTIHANCE GADOBENATE DIMEGLUMINE 529 MG/ML IV SOLN

[Series 3: DWI · axial · 3.0mm · 0.94mm/px · z∈[-59,+106]mm · 7 of 112 slices shown (1 of 2)]
[im 1/112]
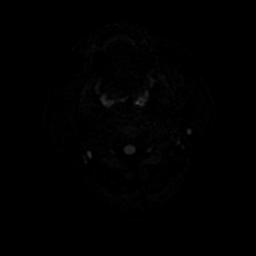
[im 19/112]
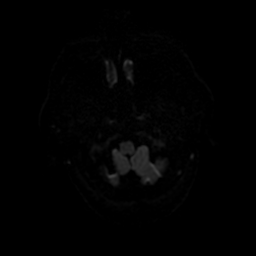
[im 38/112]
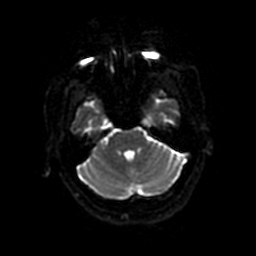
[im 56/112]
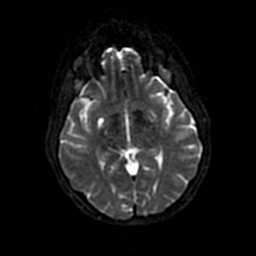
[im 75/112]
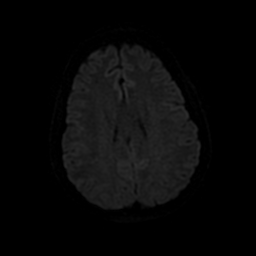
[im 93/112]
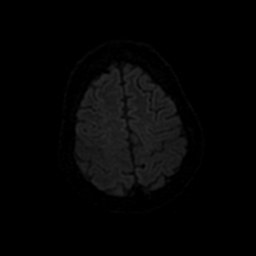
[im 112/112]
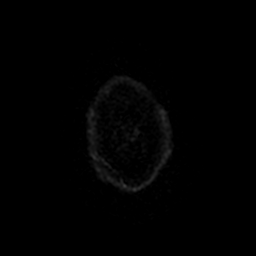

[Series 4: FLAIR · sagittal · 5.0mm · 0.47mm/px · 1 of 23 slices shown (1 of 3)]
[im 1/23]
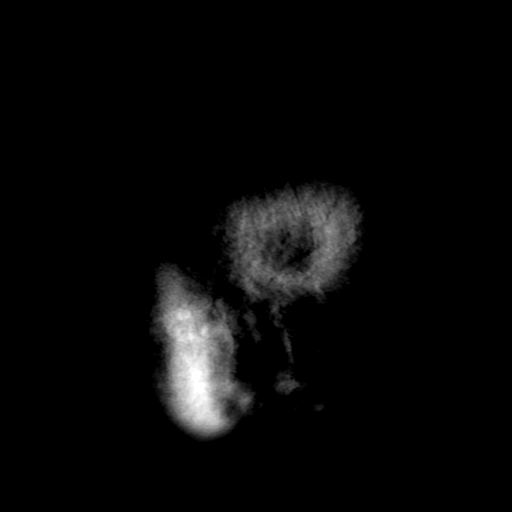

[Series 5: T2 · axial · 5.0mm · 0.47mm/px · 1 of 28 slices shown (1 of 3)]
[im 1/28]
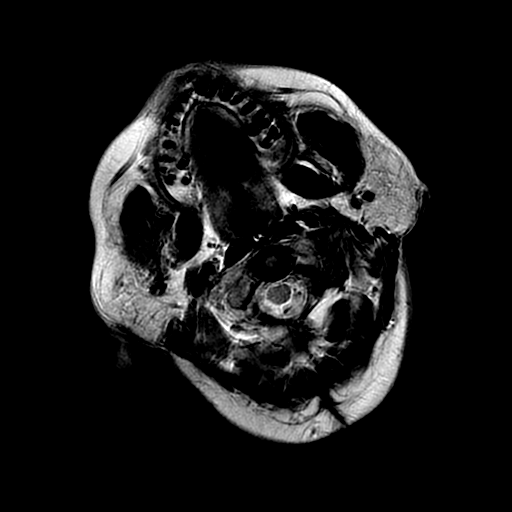

[Series 6: FLAIR · axial · 5.0mm · 0.94mm/px · z∈[-27,+122]mm · 2 of 26 slices shown (2 of 3)]
[im 1/26]
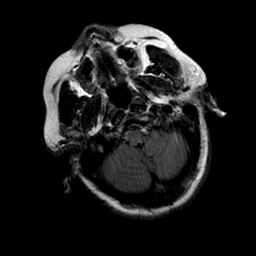
[im 26/26]
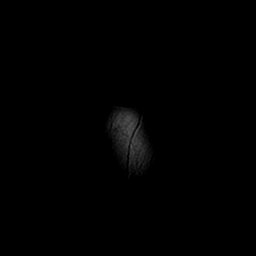

[Series 8: FLAIR · sagittal · 5.0mm · 0.49mm/px · 2 of 25 slices shown (3 of 3)]
[im 1/25]
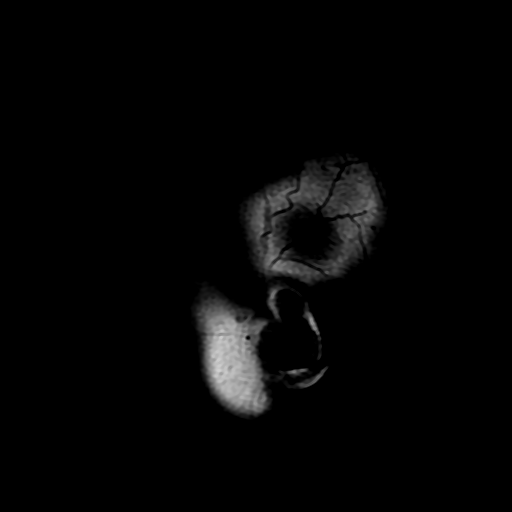
[im 25/25]
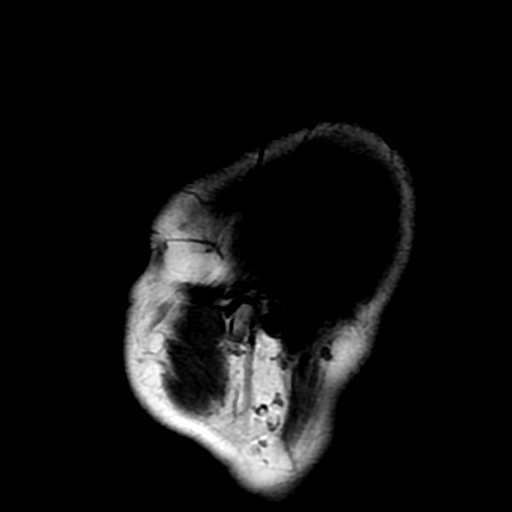

[Series 9: T2-star · axial · 5.0mm · 0.94mm/px · z∈[-27,+122]mm · 2 of 26 slices shown]
[im 1/26]
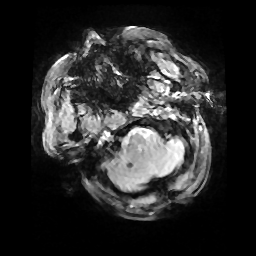
[im 26/26]
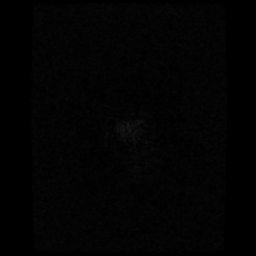

[Series 10: DWI · coronal · 5.0mm · 0.94mm/px · 4 of 61 slices shown (2 of 2)]
[im 1/61]
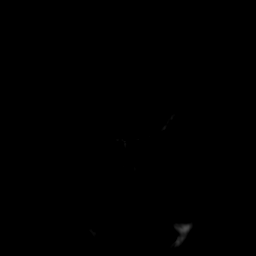
[im 21/61]
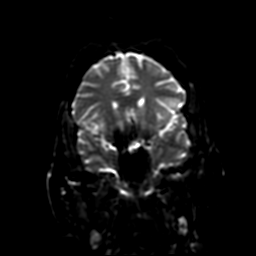
[im 41/61]
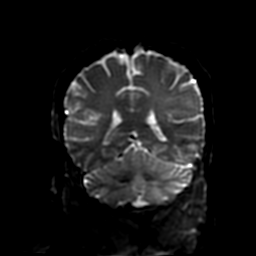
[im 61/61]
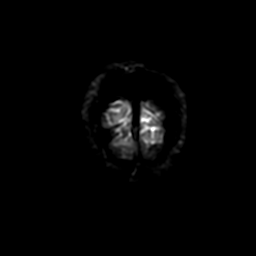

[Series 11: ax 3(person_name) · axial · 3.0mm · 0.94mm/px · z∈[-60,+105]mm · 4 of 56 slices shown]
[im 1/56]
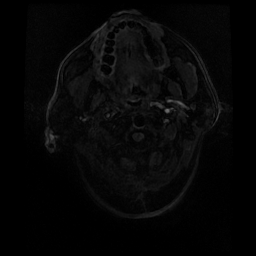
[im 19/56]
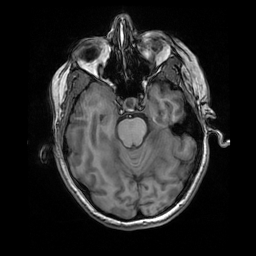
[im 37/56]
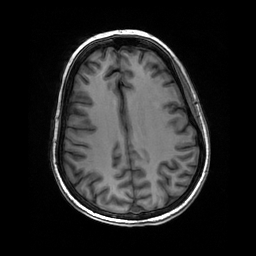
[im 56/56]
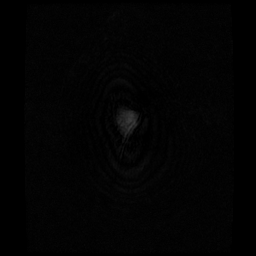

[Series 12: T2 · coronal · 5.0mm · 0.47mm/px · 2 of 27 slices shown (2 of 3)]
[im 1/27]
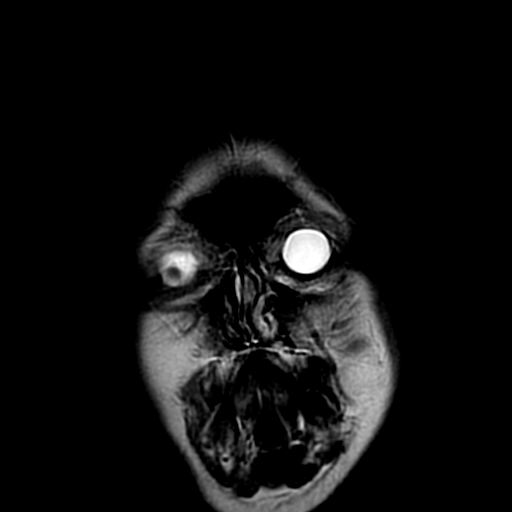
[im 27/27]
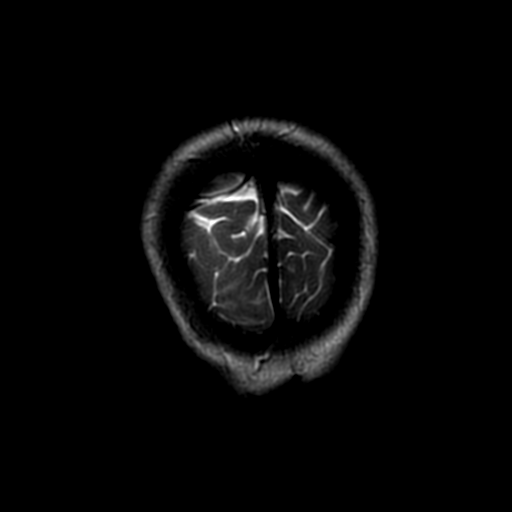

[Series 13: T2 · coronal · 3.5mm · 0.35mm/px · 2 of 29 slices shown (3 of 3)]
[im 1/29]
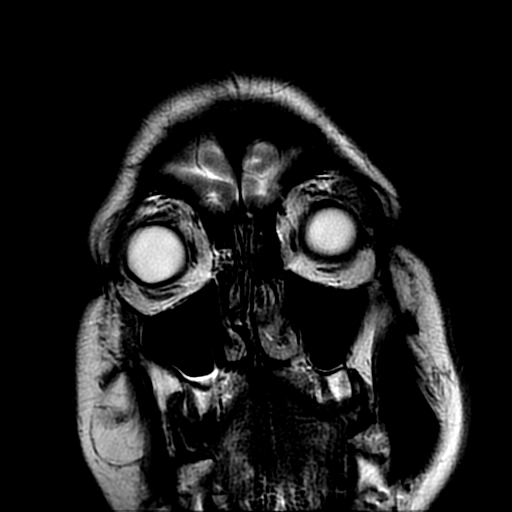
[im 29/29]
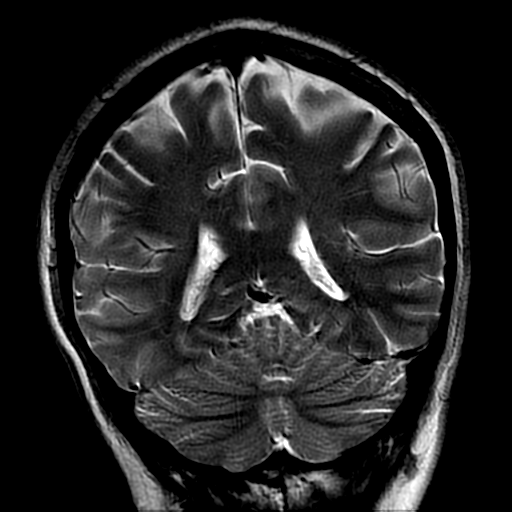

[Series 14: ax 3(person_name) +c · axial · 3.0mm · 0.94mm/px · z∈[-60,+105]mm · 4 of 56 slices shown]
[im 1/56]
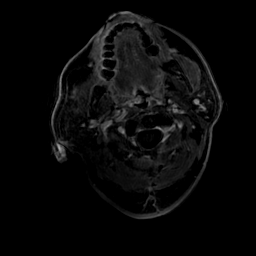
[im 19/56]
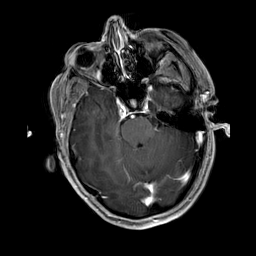
[im 37/56]
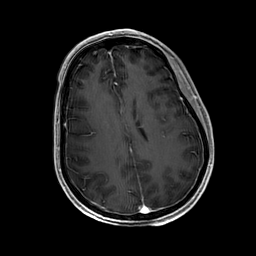
[im 56/56]
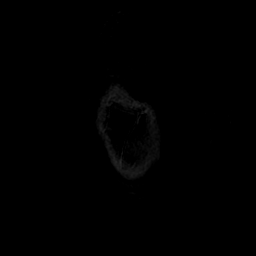

[Series 15: T1 · coronal · 5.0mm · 0.47mm/px · 2 of 27 slices shown]
[im 1/27]
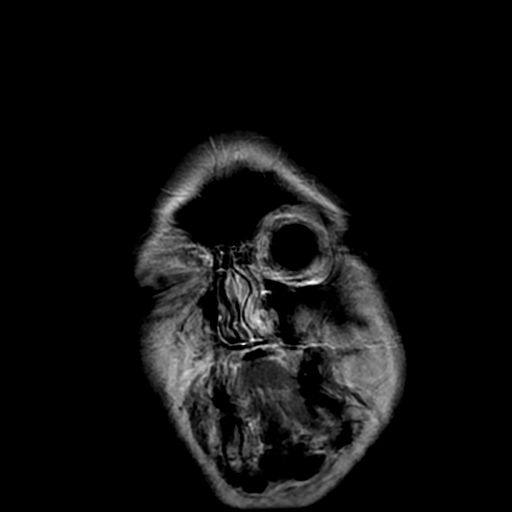
[im 27/27]
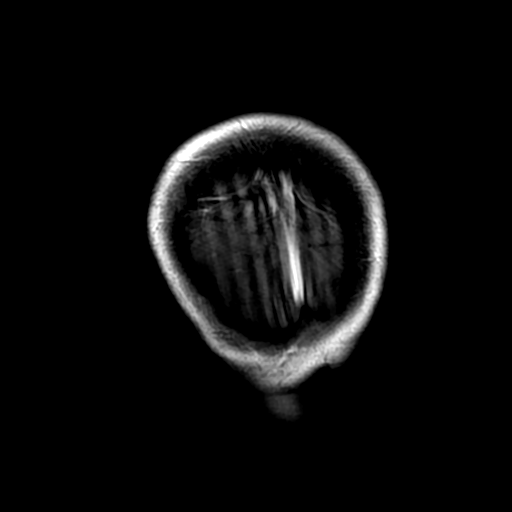

[Series 350: ADC · axial · 3.0mm · 0.94mm/px · z∈[-59,+106]mm · 4 of 55 slices shown (1 of 2)]
[im 1/55]
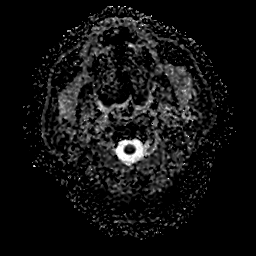
[im 19/55]
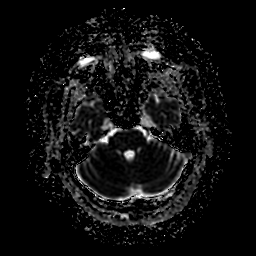
[im 37/55]
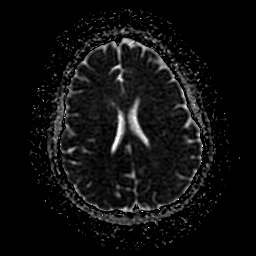
[im 55/55]
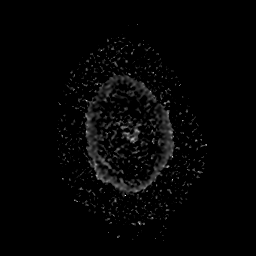

[Series 1050: ADC · coronal · 5.0mm · 0.94mm/px · 2 of 31 slices shown (2 of 2)]
[im 1/31]
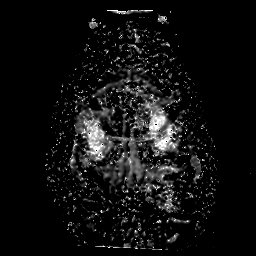
[im 31/31]
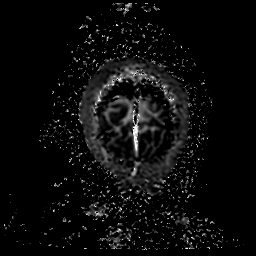

[39 of 48 positions shown; findings below may reference images not displayed]

FINDINGS: The patient was unable to remain motionless for the exam. Small or
subtle lesions could be overlooked.

Brain: No acute stroke, acute hemorrhage, mass lesion,
hydrocephalus, or extra-axial fluid.

Prior Chiari decompression. No cerebellar tonsillar impaction or
cervicomedullary compression. Small focus of RIGHT inferior
susceptibility on gradient sequence, could be artifactual, could
reflect previous surgical trauma, or may be incidental, stable from
prior MR. No visible upper cervical hydromyelia.

Overall cerebral volume appears normal. No temporal lobe asymmetry
on dedicated coronal imaging. RIGHT frontal subcortical white matter
focus, stable, likely chronic ischemia.

Post infusion, no abnormal enhancement.

Vascular: Normal flow voids.  LEFT vertebral dominant.

Skull and upper cervical spine: Normal marrow signal. Previous
suboccipital decompression for Chiari.

Sinuses/Orbits: Negative.

Other: None.
IMPRESSION: Markedly motion degraded exam demonstrating no acute intracranial
abnormality.

Within limits for comparison with priors, no interval change.

Status post suboccipital decompression for Chiari malformation,
without residual cervicomedullary compression.

## 2018-07-29 DIAGNOSIS — J312 Chronic pharyngitis: Secondary | ICD-10-CM

## 2018-07-29 HISTORY — DX: Chronic pharyngitis: J31.2

## 2019-02-17 DIAGNOSIS — R0602 Shortness of breath: Secondary | ICD-10-CM | POA: Insufficient documentation

## 2019-04-11 DIAGNOSIS — I340 Nonrheumatic mitral (valve) insufficiency: Secondary | ICD-10-CM

## 2019-05-12 ENCOUNTER — Encounter: Payer: Self-pay | Admitting: Internal Medicine

## 2019-05-13 ENCOUNTER — Encounter: Payer: Self-pay | Admitting: Cardiology

## 2019-05-13 DIAGNOSIS — R079 Chest pain, unspecified: Secondary | ICD-10-CM

## 2019-05-13 DIAGNOSIS — E785 Hyperlipidemia, unspecified: Secondary | ICD-10-CM

## 2019-05-13 DIAGNOSIS — I10 Essential (primary) hypertension: Secondary | ICD-10-CM

## 2019-05-15 ENCOUNTER — Telehealth: Payer: Self-pay | Admitting: Cardiology

## 2019-05-15 NOTE — Telephone Encounter (Signed)
Patient states she was seen at [facility] and was referred to see Dr. Harriet Masson.

## 2019-05-29 ENCOUNTER — Ambulatory Visit: Payer: Medicare Other | Admitting: Cardiology

## 2019-06-02 ENCOUNTER — Ambulatory Visit: Payer: Medicare Other | Admitting: Cardiology

## 2019-06-16 ENCOUNTER — Other Ambulatory Visit: Payer: Self-pay

## 2019-06-16 DIAGNOSIS — R102 Pelvic and perineal pain: Secondary | ICD-10-CM

## 2019-06-16 DIAGNOSIS — Z789 Other specified health status: Secondary | ICD-10-CM

## 2019-06-16 DIAGNOSIS — I10 Essential (primary) hypertension: Secondary | ICD-10-CM

## 2019-06-16 HISTORY — DX: Other specified health status: Z78.9

## 2019-06-16 HISTORY — DX: Pelvic and perineal pain: R10.2

## 2019-06-16 HISTORY — DX: Essential (primary) hypertension: I10

## 2019-06-17 ENCOUNTER — Other Ambulatory Visit: Payer: Self-pay

## 2019-06-17 ENCOUNTER — Ambulatory Visit (INDEPENDENT_AMBULATORY_CARE_PROVIDER_SITE_OTHER): Payer: Medicare Other | Admitting: Cardiology

## 2019-06-17 ENCOUNTER — Encounter: Payer: Self-pay | Admitting: Cardiology

## 2019-06-17 VITALS — BP 154/82 | HR 92 | Ht 66.0 in | Wt 238.6 lb

## 2019-06-17 DIAGNOSIS — I10 Essential (primary) hypertension: Secondary | ICD-10-CM | POA: Diagnosis not present

## 2019-06-17 DIAGNOSIS — E782 Mixed hyperlipidemia: Secondary | ICD-10-CM | POA: Diagnosis not present

## 2019-06-17 DIAGNOSIS — R079 Chest pain, unspecified: Secondary | ICD-10-CM

## 2019-06-17 DIAGNOSIS — E669 Obesity, unspecified: Secondary | ICD-10-CM

## 2019-06-17 DIAGNOSIS — E119 Type 2 diabetes mellitus without complications: Secondary | ICD-10-CM | POA: Diagnosis not present

## 2019-06-17 DIAGNOSIS — Z8249 Family history of ischemic heart disease and other diseases of the circulatory system: Secondary | ICD-10-CM

## 2019-06-17 DIAGNOSIS — Z794 Long term (current) use of insulin: Secondary | ICD-10-CM

## 2019-06-17 MED ORDER — NITROGLYCERIN 0.4 MG SL SUBL: 0.4000 mg | SUBLINGUAL_TABLET | SUBLINGUAL | 3 refills | Status: DC | PRN

## 2019-06-17 NOTE — Progress Notes (Signed)
Cardiology Office Note:    Date:  06/17/2019   ID:  Meredith Cherry, DOB [birth year], MRN [id number]  PCP:  Myrlene Broker, MD  Cardiologist:  Berniece Salines, DO  Electrophysiologist:  None   Referring MD: Myrlene Broker, MD   " I am here because I have been experiencing chest pain"  History of Present Illness:    Meredith Cherry is a 49 y.o. female with a hx of insulin-dependent diabetes type 2, hypertension, hyperlipidemia, GERD, depression, bipolar disorder with anxiety presents today to be evaluated for chest pain.  The patient tells me that over the last several months she has been experiencing significant right-sided intermittent chest pain.  She notes that sometimes it radiates to her neck.  She states that the pain can be dull and intermittent sharp.  But however note that sometime in April she experienced some chest heaviness when to the ED at [facility] where she noted that she did have some chest pressure at that time serial troponin was done and patient was discharged home with this testing was negative.  In the office she denies any chest pain.  Of note the patient reported that she had a left heart catheterization back in 2017 at which time she was reported to have normal coronaries.  Also reviewed this testing in Care Everywhere which confirms her left heart catheterization with normal coronary arteries.   Past Medical History:  Diagnosis Date  . Acute encephalopathy   . Acute generalized abdominal pain 04/02/2015  . Acute recurrent pansinusitis 11/24/2016  . ADHD (attention deficit hyperactivity disorder)   . Adjustment disorder with mixed anxiety and depressed mood 06/01/2016  . Allergic rhinitis   . Anxiety   . Arrhythmia 09/03/2012  . Asthma   . Bipolar 1 disorder ([place])   . Bipolar affect, depressed ([place]) 02/19/2017  . Borderline personality disorder ([place]) 02/27/2017  . BRBPR (bright red blood per rectum) 12/12/2017  . Bronchitis 09/03/2012  . Chest pain at rest  07/02/2015  . Chest wall pain 05/22/2018  . Chiari malformation type I ([place])   . Chronic migraine   . Colitis 06/14/2018  . Conversion disorder with abnormal movement 08/23/2015  . Decreased activities of daily living (ADL) 06/16/2019  . Depression   . Diabetes ([place]) 02/26/2017  . Diabetes mellitus without complication ([place])   . Drug-induced hypotension 05/17/2017  . Essential hypertension 11/22/2012  . 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 Gastro-esophageal reflux disease without esophagitis 11/22/2012  . GERD (gastroesophageal reflux disease)   . H/O chest pain 04/02/2015  . Headache, unspecified 09/16/2012  . High risk medication use 09/07/2016  . History of Chiari malformation 11/15/2015  . History of infectious disease 11/22/2012   Formatting of this note might be different from the original. Overview:  H/o cutaneous skin infection  . History of MRSA infection 11/22/2012   Formatting of this note might be different from the original. H/o cutaneous skin infection  . Homicidal ideation 08/23/2016  . Hospital discharge follow-up 11/12/2017  . Hypertension   . Hypertensive disorder 06/16/2019  . Hypertensive urgency 04/02/2015  . Hypokalemia 06/14/2018  . Hyponatremia 06/14/2018  . Hypotension due to hypovolemia 12/12/2016  . Lumbar spondylosis with myelopathy 09/03/2012  . 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 Migraine 02/26/2017  . Migraine without aura 09/03/2012  . Orthostatic hypotension 02/12/2017  . Pain in pelvis 06/16/2019  . Panic attack   . Pharyngitis, chronic 07/29/2018  . Prolonged QT interval  11/15/2015  . Pseudoseizure 09/14/2015  . 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 Psychiatric disorder   . 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 PTSD (post-traumatic stress disorder) 09/14/2015  . Right leg weakness 04/02/2015  . Seborrheic dermatitis 07/31/2011  . Snoring 12/07/2011  . Subacute frontal sinusitis 09/07/2016  . Supraventricular tachycardia  ([place]) 04/04/2017  . Syncope and collapse 11/22/2012  . Type 2 or unspecified type diabetes mellitus 06/08/2015  . Uncontrolled hypertension 02/22/2017  . Urinary retention 04/02/2015   Formatting of this note might be different from the original. Had retention of 400 cc, 999 cc, 900 cc.    Past Surgical History:  Procedure Laterality Date  . CHOLECYSTECTOMY    . TUBAL LIGATION      Current Medications: Current Meds  Medication Sig  . albuterol (VENTOLIN HFA) 108 (90 Base) MCG/ACT inhaler Inhale 2 puffs into the lungs every 4 (four) hours.  Marland Kitchen aspirin EC 81 MG EC tablet Take 1 tablet (81 mg total) by mouth daily.  . chlorthalidone (HYGROTON) 25 MG tablet Take 25 mg by mouth daily.  . divalproex (DEPAKOTE) 500 MG DR tablet Take 500 mg by mouth 2 (two) times daily.  Marland Kitchen gabapentin (NEURONTIN) 300 MG capsule Take 900 mg by mouth 2 (two) times daily.  . Melatonin 10 MG TABS Take 20 mg by mouth daily.  . meloxicam (MOBIC) 15 MG tablet Take 15 mg by mouth daily.  . methocarbamol (ROBAXIN) 500 MG tablet Take by mouth.  . metoprolol tartrate (LOPRESSOR) 50 MG tablet Take 1 tablet (50 mg total) by mouth 2 (two) times daily.  . montelukast (SINGULAIR) 10 MG tablet Take 10 mg by mouth daily.  . nitroGLYCERIN (NITROSTAT) 0.4 MG SL tablet Place 1 tablet (0.4 mg total) under the tongue as needed. Every 5 minutes as needed for chest pain  . pantoprazole (PROTONIX) 40 MG tablet Take 40 mg by mouth 2 (two) times daily.  . promethazine (PHENERGAN) 25 MG tablet Take 0.5-1 tab PO Q6HR PRN Nausea, Vomting  . QUEtiapine (SEROQUEL) 300 MG tablet Take by mouth.  . Semaglutide,0.25 or 0.5MG /DOS, (OZEMPIC, 0.25 OR 0.5 MG/DOSE,) 2 MG/1.5ML SOPN Inject into the skin.  Marland Kitchen sertraline (ZOLOFT) 100 MG tablet Take 100 mg by mouth daily.  . tamsulosin (FLOMAX) 0.4 MG CAPS capsule Take 0.4 mg by mouth daily.  Marland Kitchen telmisartan (MICARDIS) 20 MG tablet Take 20 mg by mouth daily.  . traMADol (ULTRAM) 50 MG tablet Take 50 mg by  mouth every 6 (six) hours as needed for moderate pain.  . [DISCONTINUED] nitroGLYCERIN (NITROSTAT) 0.4 MG SL tablet Place 0.4 mg under the tongue as needed. Every 5 minutes as needed for chest pain     Allergies:   Ciprofloxacin, Penicillin v, Penicillins, Sulfa antibiotics, and Sulfamethoxazole-trimethoprim   Social History   Socioeconomic History  . Marital status: Married    Spouse name: Not on file  . Number of children: 2  . Years of education: Not on file  . Highest education level: Not on file  Occupational History  . Occupation: unemployed  Tobacco Use  . Smoking status: Former Smoker    Types: Cigarettes  . Smokeless tobacco: Never Used  Substance and Sexual Activity  . Alcohol use: No  . Drug use: No  . Sexual activity: Not on file  Other Topics Concern  . Not on file  Social History Narrative  . Not on file   Social Determinants of Health   Financial Resource Strain:   . Difficulty of Paying Living Expenses:  Food Insecurity:   . Worried About Charity fundraiser in the Last Year:   . Arboriculturist in the Last Year:   Transportation Needs:   . Film/video editor (Medical):   Marland Kitchen Lack of Transportation (Non-Medical):   Physical Activity:   . Days of Exercise per Week:   . Minutes of Exercise per Session:   Stress:   . Feeling of Stress :   Social Connections:   . Frequency of Communication with Friends and Family:   . Frequency of Social Gatherings with Friends and Family:   . Attends Religious Services:   . Active Member of Clubs or Organizations:   . Attends Archivist Meetings:   Marland Kitchen Marital Status:      Family History: The patient's family history includes Asthma in her brother; Breast cancer in her paternal grandmother; Diabetes in her father and mother; Heart disease in her maternal grandmother and mother; Hypertension in her mother; Stroke in her mother.  ROS:   Review of Systems  Constitution: Negative for decreased appetite,  fever and weight gain.  HENT: Negative for congestion, ear discharge, hoarse voice and sore throat.   Eyes: Negative for discharge, redness, vision loss in right eye and visual halos.  Cardiovascular: Reports chest pain.  Negative for, dyspnea on exertion, leg swelling, orthopnea and palpitations.  Respiratory: Negative for cough, hemoptysis, shortness of breath and snoring.   Endocrine: Negative for heat intolerance and polyphagia.  Hematologic/Lymphatic: Negative for bleeding problem. Does not bruise/bleed easily.  Skin: Negative for flushing, nail changes, rash and suspicious lesions.  Musculoskeletal: Negative for arthritis, joint pain, muscle cramps, myalgias, neck pain and stiffness.  Gastrointestinal: Negative for abdominal pain, bowel incontinence, diarrhea and excessive appetite.  Genitourinary: Negative for decreased libido, genital sores and incomplete emptying.  Neurological: Negative for brief paralysis, focal weakness, headaches and loss of balance.  Psychiatric/Behavioral: Negative for altered mental status, depression and suicidal ideas.  Allergic/Immunologic: Negative for HIV exposure and persistent infections.    EKGs/Labs/Other Studies Reviewed:    The following studies were reviewed today:   EKG:  The ekg ordered today demonstrates sinus rhythm, heart rate 92 bpm, with nonspecific ST changes.  The heart catheterization done at [facility] in 2017: have reviewed the recent history and physical documentation. I personally spent 29 minutes continuously monitoring the patient during the administration of moderate sedation. Pre and post activities have been reviewed. I was present for the entire procedure.  Signatures  Electronically signed by Clarene Critchley, MD, FACC(Diagnostic  Physician) on 06/02/2015 16:06  Angiographic findings  Cardiac Arteries and Lesion Findings LMCA: Normal appearance with 0% stenosis. LAD: Normal appearance with 0% stenosis. LCx:  Normal appearance with 0% stenosis. RCA: Normal appearance with 0% stenosis. Ramus: Normal appearance with 0% stenosis.  Echocardiogram done in March 2021 at [facility] showed mild concentric left ventricular hypertrophy.  Grade 1 diastolic dysfunction.  No wall motion abnormalities.  The right ventricle was normal size and function.  Left atrium was mildly dilated.  Right atrium was normal size.  Interatrial septum was mobile.  Positive bubble study without Valsalva.   CTA of the chest without contrast: No acute finding on May 13, 2019.  Recent Labs: No results found for requested labs within last 8760 hours.  Recent Lipid Panel    Component Value Date/Time   CHOL 164 02/27/2017 0734   TRIG 293 (H) 02/27/2017 0734   HDL 41 02/27/2017 0734   CHOLHDL 4.0 02/27/2017 0734  VLDL 59 (H) 02/27/2017 0734   LDLCALC 64 02/27/2017 0734    Physical Exam:    VS:  BP (!) 154/82   Pulse 92   Ht 5\' 6"  (1.676 m)   Wt 238 lb 9.6 oz (108.2 kg)   SpO2 98%   BMI 38.51 kg/m     Wt Readings from Last 3 Encounters:  06/17/19 238 lb 9.6 oz (108.2 kg)  02/26/17 196 lb 6.9 oz (89.1 kg)  02/19/17 201 lb (91.2 kg)     GEN: Well nourished, well developed in no acute distress HEENT: Normal NECK: No JVD; No carotid bruits LYMPHATICS: No lymphadenopathy CARDIAC: S1S2 noted,RRR, no murmurs, rubs, gallops RESPIRATORY:  Clear to auscultation without rales, wheezing or rhonchi  ABDOMEN: Soft, non-tender, non-distended, +bowel sounds, no guarding. EXTREMITIES: No edema, No cyanosis, no clubbing MUSCULOSKELETAL:  No deformity  SKIN: Warm and dry NEUROLOGIC:  Alert and oriented x 3, non-focal PSYCHIATRIC:  Normal affect, good insight  ASSESSMENT:    1. Chest pain of uncertain etiology   2. Chest pain, unspecified type   3. Essential hypertension   4. Insulin-requiring or dependent type II diabetes mellitus ([place])   5. Mixed hyperlipidemia   6. Family history of premature CAD   7. Obesity  (BMI 30-39.9)    PLAN:     1.  Her chest pain is concerning.  I did review her left heart cath in 2017 given her risk factors I think that we should reevaluate this patient-ischemic evaluation will be pursued.  I discussed with the patient about a pharmacologic nuclear stress test she is agreeable to proceed with this.  In the meantime, sublingual nitroglycerin prescription was sent, its protocol and 911 protocol explained and the patient vocalized understanding questions were answered to the patient's satisfaction.  2.  Her blood pressures not at target of 130/80 today.  Discussed with the patient for optimizing her antihypertensive medication but she declines at this time and said that she just had an argument with her husband therefore she thinks this is related to this and does not want any change in her antihypertensive medication.  With this we are going to continue the patient on her telmisartan 20 mg daily, and Lopressor 50 mg twice a daily, chlorthalidone 25 mg daily.  3.  Type 2 diabetes continue her current medication regimen.  She is not on a statin medication abductor the patient about this she prefers to hold off for now.  4.  Obesity-the patient understands the need to lose weight with diet and exercise. We have discussed specific strategies for this.  The patient is in agreement with the above plan. The patient left the office in stable condition.  The patient will follow up in 3 months or sooner if needed.   Medication Adjustments/Labs and Tests Ordered: Current medicines are reviewed at length with the patient today.  Concerns regarding medicines are outlined above.  Orders Placed This Encounter  Procedures  . MYOCARDIAL PERFUSION IMAGING  . EKG 12-Lead   Meds ordered this encounter  Medications  . nitroGLYCERIN (NITROSTAT) 0.4 MG SL tablet    Sig: Place 1 tablet (0.4 mg total) under the tongue as needed. Every 5 minutes as needed for chest pain    Dispense:  25 tablet     Refill:  3    Patient Instructions  Medication Instructions:   Your physician recommends that you continue on your current medications as directed. Please refer to the Current Medication list given to you today.    *  If you need a refill on your cardiac medications before your next appointment, please call your pharmacy*   Lab Work: None ordered   If you have labs (blood work) drawn today and your tests are completely normal, you will receive your results only by: Marland Kitchen MyChart Message (if you have MyChart) OR . A paper copy in the mail If you have any lab test that is abnormal or we need to change your treatment, we will call you to review the results.   Testing/Procedures: Your physician has requested that you have a lexiscan myoview. For further information please visit [email or web address]. Please follow instruction sheet, as given.  Follow-Up: At [facility], you and your health needs are our priority.  As part of our continuing mission to provide you with exceptional heart care, we have created designated Provider Care Teams.  These Care Teams include your primary Cardiologist (physician) and Advanced Practice Providers (APPs -  Physician Assistants and Nurse Practitioners) who all work together to provide you with the care you need, when you need it.  We recommend signing up for the patient portal called "MyChart".  Sign up information is provided on this After Visit Summary.  MyChart is used to connect with patients for Virtual Visits (Telemedicine).  Patients are able to view lab/test results, encounter notes, upcoming appointments, etc.  Non-urgent messages can be sent to your provider as well.   To learn more about what you can do with MyChart, go to [email or web address].    Your next appointment:   3 month(s)  The format for your next appointment:   In Person  Provider:   Berniece Salines, DO   Other Instructions None      Adopting a Healthy Lifestyle.  Know what  a healthy weight is for you (roughly BMI <25) and aim to maintain this   Aim for 7+ servings of fruits and vegetables daily   65-80+ fluid ounces of water or unsweet tea for healthy kidneys   Limit to max 1 drink of alcohol per day; avoid smoking/tobacco   Limit animal fats in diet for cholesterol and heart health - choose grass fed whenever available   Avoid highly processed foods, and foods high in saturated/trans fats   Aim for low stress - take time to unwind and care for your mental health   Aim for 150 min of moderate intensity exercise weekly for heart health, and weights twice weekly for bone health   Aim for 7-9 hours of sleep daily   When it comes to diets, agreement about the perfect plan isnt easy to find, even among the experts. Experts at the [employer] developed an idea known as the Healthy Eating Plate. Just imagine a plate divided into logical, healthy portions.   The emphasis is on diet quality:   Load up on vegetables and fruits - one-half of your plate: Aim for color and variety, and remember that potatoes dont count.   Go for whole grains - one-quarter of your plate: Whole wheat, barley, wheat berries, quinoa, oats, brown rice, and foods made with them. If you want pasta, go with whole wheat pasta.   Protein power - one-quarter of your plate: Fish, chicken, beans, and nuts are all healthy, versatile protein sources. Limit red meat.   The diet, however, does go beyond the plate, offering a few other suggestions.   Use healthy plant oils, such as olive, canola, soy, corn, sunflower and peanut. Check the labels, and avoid partially hydrogenated  oil, which have unhealthy trans fats.   If youre thirsty, drink water. Coffee and tea are good in moderation, but skip sugary drinks and limit milk and dairy products to one or two daily servings.   The type of carbohydrate in the diet is more important than the amount. Some sources of carbohydrates,  such as vegetables, fruits, whole grains, and beans-are healthier than others.   Finally, stay active  Signed, Berniece Salines, DO  06/17/2019 9:57 PM    [facility]

## 2019-06-17 NOTE — Patient Instructions (Addendum)
Medication Instructions:   Your physician recommends that you continue on your current medications as directed. Please refer to the Current Medication list given to you today.    *If you need a refill on your cardiac medications before your next appointment, please call your pharmacy*   Lab Work: None ordered   If you have labs (blood work) drawn today and your tests are completely normal, you will receive your results only by: Marland Kitchen MyChart Message (if you have MyChart) OR . A paper copy in the mail If you have any lab test that is abnormal or we need to change your treatment, we will call you to review the results.   Testing/Procedures: Your physician has requested that you have a lexiscan myoview. For further information please visit [email or web address]. Please follow instruction sheet, as given.  Follow-Up: At [facility], you and your health needs are our priority.  As part of our continuing mission to provide you with exceptional heart care, we have created designated Provider Care Teams.  These Care Teams include your primary Cardiologist (physician) and Advanced Practice Providers (APPs -  Physician Assistants and Nurse Practitioners) who all work together to provide you with the care you need, when you need it.  We recommend signing up for the patient portal called "MyChart".  Sign up information is provided on this After Visit Summary.  MyChart is used to connect with patients for Virtual Visits (Telemedicine).  Patients are able to view lab/test results, encounter notes, upcoming appointments, etc.  Non-urgent messages can be sent to your provider as well.   To learn more about what you can do with MyChart, go to [email or web address].    Your next appointment:   3 month(s)  The format for your next appointment:   In Person  Provider:   Berniece Salines, DO   Other Instructions None

## 2019-07-08 ENCOUNTER — Telehealth (HOSPITAL_COMMUNITY): Payer: Self-pay | Admitting: *Deleted

## 2019-07-08 NOTE — Telephone Encounter (Signed)
Patient given detailed instructions per Myocardial Perfusion Study Information Sheet for the test on  07/15/19. Patient notified to arrive 15 minutes early and that it is imperative to arrive on time for appointment to keep from having the test rescheduled.  If you need to cancel or reschedule your appointment, please call the office within 24 hours of your appointment. . Patient verbalized understanding. Kirstie Peri

## 2019-07-09 ENCOUNTER — Ambulatory Visit: Payer: Medicare Other

## 2019-07-15 ENCOUNTER — Other Ambulatory Visit: Payer: Self-pay

## 2019-07-15 ENCOUNTER — Ambulatory Visit (INDEPENDENT_AMBULATORY_CARE_PROVIDER_SITE_OTHER): Payer: Medicare Other

## 2019-07-15 DIAGNOSIS — R079 Chest pain, unspecified: Secondary | ICD-10-CM

## 2019-07-15 MED ORDER — ADENOSINE (DIAGNOSTIC) 3 MG/ML IV SOLN
0.5600 mg/kg | Freq: Once | INTRAVENOUS | Status: AC
Start: 2019-07-15 — End: 2019-07-15
  Administered 2019-07-15: 60 mg via INTRAVENOUS

## 2019-07-15 MED ORDER — TECHNETIUM TC 99M TETROFOSMIN IV KIT
31.9000 | PACK | Freq: Once | INTRAVENOUS | Status: AC | PRN
Start: 2019-07-15 — End: 2019-07-15
  Administered 2019-07-15: 31.9 via INTRAVENOUS

## 2019-07-16 ENCOUNTER — Ambulatory Visit: Payer: Medicare Other

## 2019-07-16 LAB — MYOCARDIAL PERFUSION IMAGING
LV dias vol: 82 mL (ref 46–106)
LV sys vol: 28 mL
Peak HR: 102 {beats}/min
Rest HR: 87 {beats}/min
SDS: 1
SRS: 0
SSS: 1
TID: 0.85

## 2019-07-16 MED ORDER — TECHNETIUM TC 99M TETROFOSMIN IV KIT
32.0000 | PACK | Freq: Once | INTRAVENOUS | Status: AC | PRN
  Administered 2019-07-16: 32 via INTRAVENOUS

## 2019-07-17 ENCOUNTER — Telehealth: Payer: Self-pay

## 2019-07-17 NOTE — Telephone Encounter (Signed)
-----   Message from Richardo Priest, MD sent at 07/17/2019  8:00 AM EDT ----- Normal or stable result  The images are normal.

## 2019-07-17 NOTE — Telephone Encounter (Signed)
Left message on patients voicemail to please return our call.   

## 2019-07-17 NOTE — Telephone Encounter (Signed)
Spoke with patient regarding results and recommendation.  Patient verbalizes understanding and is agreeable to plan of care. Advised patient to call back with any issues or concerns.  

## 2019-08-28 DIAGNOSIS — E878 Other disorders of electrolyte and fluid balance, not elsewhere classified: Secondary | ICD-10-CM | POA: Insufficient documentation

## 2019-08-28 DIAGNOSIS — D649 Anemia, unspecified: Secondary | ICD-10-CM | POA: Insufficient documentation

## 2019-09-19 ENCOUNTER — Other Ambulatory Visit: Payer: Self-pay

## 2019-09-19 ENCOUNTER — Encounter: Payer: Self-pay | Admitting: Cardiology

## 2019-09-19 ENCOUNTER — Ambulatory Visit: Payer: Medicare Other | Admitting: Cardiology

## 2019-09-19 ENCOUNTER — Ambulatory Visit (INDEPENDENT_AMBULATORY_CARE_PROVIDER_SITE_OTHER): Payer: Medicare Other

## 2019-09-19 VITALS — BP 114/70 | HR 90 | Ht 66.0 in | Wt 232.0 lb

## 2019-09-19 DIAGNOSIS — E669 Obesity, unspecified: Secondary | ICD-10-CM

## 2019-09-19 DIAGNOSIS — R55 Syncope and collapse: Secondary | ICD-10-CM | POA: Diagnosis not present

## 2019-09-19 DIAGNOSIS — R42 Dizziness and giddiness: Secondary | ICD-10-CM | POA: Diagnosis not present

## 2019-09-19 DIAGNOSIS — I10 Essential (primary) hypertension: Secondary | ICD-10-CM | POA: Diagnosis not present

## 2019-09-19 NOTE — Progress Notes (Signed)
Cardiology Office Note:    Date:  09/19/2019   ID:  Meredith Cherry, DOB 1970-02-25, MRN [id number]  PCP:  Myrlene Broker, MD  Cardiologist:  Berniece Salines, DO  Electrophysiologist:  None   Referring MD: Myrlene Broker, MD   Chief Complaint  Patient presents with  . Follow-up   History of Present Illness:    Meredith Cherry is a 50 y.o. female with a hx of insulin-dependent diabetes type 2, hypertension, hyperlipidemia, GERD, depression, bipolar disorder with anxiety.  The patient is here today with her daughter.  She tells me for the last several weeks she has had a very stressful time.  She reports that she has had 2 syncope episodes but noted that these were times when she was expressing significant anxiety and was in a great conflict with her daughter.  Denies any chest pain or shortness of breath.  Past Medical History:  Diagnosis Date  . Acute encephalopathy   . Acute generalized abdominal pain 04/02/2015  . Acute recurrent pansinusitis 11/24/2016  . ADHD (attention deficit hyperactivity disorder)   . Adjustment disorder with mixed anxiety and depressed mood 06/01/2016  . Allergic rhinitis   . Anxiety   . Arrhythmia 09/03/2012  . Asthma   . Bipolar 1 disorder ([place])   . Bipolar affect, depressed ([place]) 02/19/2017  . Borderline personality disorder ([place]) 02/27/2017  . BRBPR (bright red blood per rectum) 12/12/2017  . Bronchitis 09/03/2012  . Chest pain at rest 07/02/2015  . Chest wall pain 05/22/2018  . Chiari malformation type I ([place])   . Chronic migraine   . Colitis 06/14/2018  . Conversion disorder with abnormal movement 08/23/2015  . Decreased activities of daily living (ADL) 06/16/2019  . Depression   . Diabetes ([place]) 02/26/2017  . Diabetes mellitus without complication ([place])   . Drug-induced hypotension 05/17/2017  . Essential hypertension 11/22/2012  . 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 Gastro-esophageal reflux disease without esophagitis 11/22/2012  . GERD  (gastroesophageal reflux disease)   . H/O chest pain 04/02/2015  . Headache, unspecified 09/16/2012  . High risk medication use 09/07/2016  . History of Chiari malformation 11/15/2015  . History of infectious disease 11/22/2012   Formatting of this note might be different from the original. Overview:  H/o cutaneous skin infection  . History of MRSA infection 11/22/2012   Formatting of this note might be different from the original. H/o cutaneous skin infection  . Homicidal ideation 08/23/2016  . Hospital discharge follow-up 11/12/2017  . Hypertension   . Hypertensive disorder 06/16/2019  . Hypertensive urgency 04/02/2015  . Hypokalemia 06/14/2018  . Hyponatremia 06/14/2018  . Hypotension due to hypovolemia 12/12/2016  . Lumbar spondylosis with myelopathy 09/03/2012  . 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 Migraine 02/26/2017  . Migraine without aura 09/03/2012  . Orthostatic hypotension 02/12/2017  . Pain in pelvis 06/16/2019  . Panic attack   . Pharyngitis, chronic 07/29/2018  . Prolonged QT interval 11/15/2015  . Pseudoseizure 09/14/2015  . 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 Psychiatric disorder   . 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 PTSD (post-traumatic stress disorder) 09/14/2015  . Right leg weakness 04/02/2015  . Seborrheic dermatitis 07/31/2011  . Snoring 12/07/2011  . Subacute frontal sinusitis 09/07/2016  . Supraventricular tachycardia ([place]) 04/04/2017  . Syncope and collapse 11/22/2012  . Type 2 or unspecified type diabetes mellitus 06/08/2015  . Uncontrolled hypertension 02/22/2017  . Urinary retention 04/02/2015   Formatting of this note might be  different from the original. Had retention of 400 cc, 999 cc, 900 cc.    Past Surgical History:  Procedure Laterality Date  . CHOLECYSTECTOMY    . TUBAL LIGATION      Current Medications: Current Meds  Medication Sig  . albuterol (VENTOLIN HFA) 108 (90 Base) MCG/ACT inhaler Inhale 2 puffs  into the lungs every 4 (four) hours.  . chlorthalidone (HYGROTON) 25 MG tablet Take 25 mg by mouth daily.  . divalproex (DEPAKOTE) 500 MG DR tablet Take 500 mg by mouth 2 (two) times daily.  Marland Kitchen EPINEPHrine 0.15 MG/0.15ML IJ injection Inject 0.15 mg into the muscle as needed for anaphylaxis.   . ferrous sulfate 325 (65 FE) MG tablet Take 325 mg by mouth daily.  Marland Kitchen gabapentin (NEURONTIN) 300 MG capsule Take 900 mg by mouth 2 (two) times daily.  . Magnesium 500 MG CAPS Take 500 mg by mouth daily.  . Melatonin 10 MG TABS Take 20 mg by mouth daily.  . meloxicam (MOBIC) 15 MG tablet Take 15 mg by mouth daily.  . methocarbamol (ROBAXIN) 500 MG tablet Take 500 mg by mouth 4 (four) times daily.  . metoprolol tartrate (LOPRESSOR) 50 MG tablet Take 1 tablet (50 mg total) by mouth 2 (two) times daily.  . montelukast (SINGULAIR) 10 MG tablet Take 10 mg by mouth daily.  . nitroGLYCERIN (NITROSTAT) 0.4 MG SL tablet Place 1 tablet (0.4 mg total) under the tongue as needed. Every 5 minutes as needed for chest pain  . pantoprazole (PROTONIX) 40 MG tablet Take 40 mg by mouth 2 (two) times daily.  . pravastatin (PRAVACHOL) 40 MG tablet Take 40 mg by mouth daily.  . promethazine (PHENERGAN) 25 MG tablet Take 0.5-1 tab PO Q6HR PRN Nausea, Vomting  . QUEtiapine (SEROQUEL) 300 MG tablet Take by mouth.  . Semaglutide,0.25 or 0.5MG /DOS, (OZEMPIC, 0.25 OR 0.5 MG/DOSE,) 2 MG/1.5ML SOPN Inject into the skin.  Marland Kitchen sertraline (ZOLOFT) 100 MG tablet Take 100 mg by mouth daily.  . tamsulosin (FLOMAX) 0.4 MG CAPS capsule Take 0.4 mg by mouth daily.  Marland Kitchen telmisartan (MICARDIS) 20 MG tablet Take 20 mg by mouth daily.  . traMADol (ULTRAM) 50 MG tablet Take 50 mg by mouth every 6 (six) hours as needed for moderate pain.  Viviana Simpler ELLIPTA 100-62.5-25 MCG/INH AEPB SMARTSIG:1 Inhalation Via Inhaler Daily     Allergies:   Ciprofloxacin, Penicillin v, Penicillins, Sulfa antibiotics, and Sulfamethoxazole-trimethoprim   Social History    Socioeconomic History  . Marital status: Married    Spouse name: Not on file  . Number of children: 2  . Years of education: Not on file  . Highest education level: Not on file  Occupational History  . Occupation: unemployed  Tobacco Use  . Smoking status: Never Smoker  . Smokeless tobacco: Never Used  Substance and Sexual Activity  . Alcohol use: No  . Drug use: No  . Sexual activity: Not on file  Other Topics Concern  . Not on file  Social History Narrative  . Not on file   Social Determinants of Health   Financial Resource Strain:   . Difficulty of Paying Living Expenses: Not on file  Food Insecurity:   . Worried About Charity fundraiser in the Last Year: Not on file  . Ran Out of Food in the Last Year: Not on file  Transportation Needs:   . Lack of Transportation (Medical): Not on file  . Lack of Transportation (Non-Medical): Not on file  Physical  Activity:   . Days of Exercise per Week: Not on file  . Minutes of Exercise per Session: Not on file  Stress:   . Feeling of Stress : Not on file  Social Connections:   . Frequency of Communication with Friends and Family: Not on file  . Frequency of Social Gatherings with Friends and Family: Not on file  . Attends Religious Services: Not on file  . Active Member of Clubs or Organizations: Not on file  . Attends Archivist Meetings: Not on file  . Marital Status: Not on file     Family History: The patient's family history includes Asthma in her brother; Breast cancer in her paternal grandmother; Diabetes in her father and mother; Heart disease in her maternal grandmother and mother; Hypertension in her mother; Stroke in her mother.  ROS:   Review of Systems  Constitution: Negative for decreased appetite, fever and weight gain.  HENT: Negative for congestion, ear discharge, hoarse voice and sore throat.   Eyes: Negative for discharge, redness, vision loss in right eye and visual halos.  Cardiovascular:  Negative for chest pain, dyspnea on exertion, leg swelling, orthopnea and palpitations.  Respiratory: Negative for cough, hemoptysis, shortness of breath and snoring.   Endocrine: Negative for heat intolerance and polyphagia.  Hematologic/Lymphatic: Negative for bleeding problem. Does not bruise/bleed easily.  Skin: Negative for flushing, nail changes, rash and suspicious lesions.  Musculoskeletal: Negative for arthritis, joint pain, muscle cramps, myalgias, neck pain and stiffness.  Gastrointestinal: Negative for abdominal pain, bowel incontinence, diarrhea and excessive appetite.  Genitourinary: Negative for decreased libido, genital sores and incomplete emptying.  Neurological: Negative for brief paralysis, focal weakness, headaches and loss of balance.  Psychiatric/Behavioral: Negative for altered mental status, depression and suicidal ideas.  Allergic/Immunologic: Negative for HIV exposure and persistent infections.    EKGs/Labs/Other Studies Reviewed:    The following studies were reviewed today:   EKG: None today  Pharmacologic nuclear stress test  Nuclear stress EF: 66%.  The left ventricular ejection fraction is hyperdynamic (>65%).  There was no ST segment deviation noted during stress.  This is a low risk study.  Defect 1: There is a small defect of mild severity present in the mid anterior location.  No eidence of ischemia or MI.  Normal gated images with normal EF.  The heart catheterization done at [facility] in 2017: have reviewed the recent history and physical documentation. I personally spent 29 minutes continuously monitoring the patient during the administration of moderate sedation. Pre and post activities have been reviewed. I was present for the entire procedure.  Signatures  Electronically signed by Clarene Critchley, MD, FACC(Diagnostic  Physician) on 06/02/2015 16:06  Angiographic findings  Cardiac Arteries and Lesion Findings LMCA:  Normal appearance with 0% stenosis. LAD: Normal appearance with 0% stenosis. LCx: Normal appearance with 0% stenosis. RCA: Normal appearance with 0% stenosis. Ramus: Normal appearance with 0% stenosis.  Echocardiogram done in March 2021 at [facility] showed mild concentric left ventricular hypertrophy.  Grade 1 diastolic dysfunction.  No wall motion abnormalities.  The right ventricle was normal size and function.  Left atrium was mildly dilated.  Right atrium was normal size.  Interatrial septum was mobile.  Positive bubble study without Valsalva.    Recent Labs: No results found for requested labs within last 8760 hours.  Recent Lipid Panel    Component Value Date/Time   CHOL 164 02/27/2017 0734   TRIG 293 (H) 02/27/2017 0734   HDL  41 02/27/2017 0734   CHOLHDL 4.0 02/27/2017 0734   VLDL 59 (H) 02/27/2017 0734   LDLCALC 64 02/27/2017 0734    Physical Exam:    VS:  BP 114/70 (BP Location: Right Arm, Patient Position: Sitting, Cuff Size: Normal)   Pulse 90   Ht 5\' 6"  (1.676 m)   Wt 232 lb (105.2 kg)   SpO2 94%   BMI 37.45 kg/m     Wt Readings from Last 3 Encounters:  09/19/19 232 lb (105.2 kg)  07/15/19 238 lb (108 kg)  06/17/19 238 lb 9.6 oz (108.2 kg)     GEN: Well nourished, well developed in no acute distress HEENT: Normal NECK: No JVD; No carotid bruits LYMPHATICS: No lymphadenopathy CARDIAC: S1S2 noted,RRR, no murmurs, rubs, gallops RESPIRATORY:  Clear to auscultation without rales, wheezing or rhonchi  ABDOMEN: Soft, non-tender, non-distended, +bowel sounds, no guarding. EXTREMITIES: No edema, No cyanosis, no clubbing MUSCULOSKELETAL:  No deformity  SKIN: Warm and dry NEUROLOGIC:  Alert and oriented x 3, non-focal PSYCHIATRIC:  Normal affect, good insight  ASSESSMENT:    1. Pre-syncope   2. Essential hypertension   3. Syncope and collapse   4. Dizziness   5. Obesity (BMI 30-39.9)    PLAN:     She has had 2 syncope episodes and does have some  lightheadedness and dizziness.  Therefore I am going to place a monitor on the patient today for 14 days to make sure that this is not cardiac in nature.  Though I do suspect that it could be vasovagal given the setting.  Her blood pressure is acceptable in the office no changes will be made to her medication regimen.  She is under a lot of stress and she tells me she has been going through therapy recently was seeing her therapist couple days a week.  The patient is in agreement with the above plan. The patient left the office in stable condition.  The patient will follow up in 3 months or sooner if needed.   Medication Adjustments/Labs and Tests Ordered: Current medicines are reviewed at length with the patient today.  Concerns regarding medicines are outlined above.  Orders Placed This Encounter  Procedures  . LONG TERM MONITOR (3-14 DAYS)   No orders of the defined types were placed in this encounter.   Patient Instructions  .Medication Instructions:  Your physician recommends that you continue on your current medications as directed. Please refer to the Current Medication list given to you today.  *If you need a refill on your cardiac medications before your next appointment, please call your pharmacy*   Lab Work: None ordered  If you have labs (blood work) drawn today and your tests are completely normal, you will receive your results only by: Marland Kitchen MyChart Message (if you have MyChart) OR . A paper copy in the mail If you have any lab test that is abnormal or we need to change your treatment, we will call you to review the results.   Testing/Procedures:  Bryn Gulling- Long Term Monitor Instructions   Your physician has requested you wear your ZIO patch monitor 14 days.   This is a single patch monitor.  Irhythm supplies one patch monitor per enrollment.  Additional stickers are not available.   Please do not apply patch if you will be having a Nuclear Stress Test,  Echocardiogram, Cardiac CT, MRI, or Chest Xray during the time frame you would be wearing the monitor. The patch cannot be worn during these  tests.  You cannot remove and re-apply the ZIO XT patch monitor.    Once you have received you monitor, please review enclosed instructions.  Your monitor has already been registered assigning a specific monitor serial # to you.   Do not shower for the first 24 hours.  You may shower after the first 24 hours.   Press button if you feel a symptom. You will hear a small click.  Record Date, Time and Symptom in the Patient Log Book.   When you are ready to remove patch, follow instructions on last 2 pages of Patient Log Book.  Stick patch monitor onto last page of Patient Log Book.   Place Patient Log Book in [place] box.  Use locking tab on box and tape box closed securely.  The [employer] has [employer] on it.  Please place in mailbox as soon as possible.  Your physician should have your test results approximately 7 days after the monitor has been mailed back to [facility].   Call [place] at [phone] if you have questions regarding your ZIO XT patch monitor.  Call them immediately if you see an orange light blinking on your monitor.   If your monitor falls off in less than 4 days contact our Monitor department at [phone].  If your monitor becomes loose or falls off after 4 days call [employer] at [phone] for suggestions on securing your monitor.       Follow-Up: At [facility], you and your health needs are our priority.  As part of our continuing mission to provide you with exceptional heart care, we have created designated Provider Care Teams.  These Care Teams include your primary Cardiologist (physician) and Advanced Practice Providers (APPs -  Physician Assistants and Nurse Practitioners) who all work together to provide you with the care you need, when you need it.  We recommend signing up for the  patient portal called "MyChart".  Sign up information is provided on this After Visit Summary.  MyChart is used to connect with patients for Virtual Visits (Telemedicine).  Patients are able to view lab/test results, encounter notes, upcoming appointments, etc.  Non-urgent messages can be sent to your provider as well.   To learn more about what you can do with MyChart, go to [email or web address].    Your next appointment:   3 month(s)  The format for your next appointment:   In Person  Provider:   Berniece Salines, DO   Other Instructions     Adopting a Healthy Lifestyle.  Know what a healthy weight is for you (roughly BMI <25) and aim to maintain this   Aim for 7+ servings of fruits and vegetables daily   65-80+ fluid ounces of water or unsweet tea for healthy kidneys   Limit to max 1 drink of alcohol per day; avoid smoking/tobacco   Limit animal fats in diet for cholesterol and heart health - choose grass fed whenever available   Avoid highly processed foods, and foods high in saturated/trans fats   Aim for low stress - take time to unwind and care for your mental health   Aim for 150 min of moderate intensity exercise weekly for heart health, and weights twice weekly for bone health   Aim for 7-9 hours of sleep daily   When it comes to diets, agreement about the perfect plan isnt easy to find, even among the experts. Experts at the [employer] developed an idea  known as the Healthy Eating Plate. Just imagine a plate divided into logical, healthy portions.   The emphasis is on diet quality:   Load up on vegetables and fruits - one-half of your plate: Aim for color and variety, and remember that potatoes dont count.   Go for whole grains - one-quarter of your plate: Whole wheat, barley, wheat berries, quinoa, oats, brown rice, and foods made with them. If you want pasta, go with whole wheat pasta.   Protein power - one-quarter of your plate: Fish,  chicken, beans, and nuts are all healthy, versatile protein sources. Limit red meat.   The diet, however, does go beyond the plate, offering a few other suggestions.   Use healthy plant oils, such as olive, canola, soy, corn, sunflower and peanut. Check the labels, and avoid partially hydrogenated oil, which have unhealthy trans fats.   If youre thirsty, drink water. Coffee and tea are good in moderation, but skip sugary drinks and limit milk and dairy products to one or two daily servings.   The type of carbohydrate in the diet is more important than the amount. Some sources of carbohydrates, such as vegetables, fruits, whole grains, and beans-are healthier than others.   Finally, stay active  Signed, Berniece Salines, DO  09/19/2019 4:17 PM    [facility]

## 2019-09-19 NOTE — Patient Instructions (Addendum)
.Medication Instructions:  Your physician recommends that you continue on your current medications as directed. Please refer to the Current Medication list given to you today.  *If you need a refill on your cardiac medications before your next appointment, please call your pharmacy*   Lab Work: None ordered  If you have labs (blood work) drawn today and your tests are completely normal, you will receive your results only by: Marland Kitchen MyChart Message (if you have MyChart) OR . A paper copy in the mail If you have any lab test that is abnormal or we need to change your treatment, we will call you to review the results.   Testing/Procedures:  Bryn Gulling- Long Term Monitor Instructions   Your physician has requested you wear your ZIO patch monitor 14 days.   This is a single patch monitor.  Ir[employer] supplies one patch monitor per enrollment.  Additional stickers are not available.   Please do not apply patch if you will be having a Nuclear Stress Test, Echocardiogram, Cardiac CT, MRI, or Chest Xray during the time frame you would be wearing the monitor. The patch cannot be worn during these tests.  You cannot remove and re-apply the ZIO XT patch monitor.    Once you have received you monitor, please review enclosed instructions.  Your monitor has already been registered assigning a specific monitor serial # to you.   Do not shower for the first 24 hours.  You may shower after the first 24 hours.   Press button if you feel a symptom. You will hear a small click.  Record Date, Time and Symptom in the Patient Log Book.   When you are ready to remove patch, follow instructions on last 2 pages of Patient Log Book.  Stick patch monitor onto last page of Patient Log Book.   Place Patient Log Book in [place] box.  Use locking tab on box and tape box closed securely.  The [employer] has [employer] on it.  Please place in mailbox as soon as possible.  Your physician should have your test results  approximately 7 days after the monitor has been mailed back to [facility].   Call [place] at [phone] if you have questions regarding your ZIO XT patch monitor.  Call them immediately if you see an orange light blinking on your monitor.   If your monitor falls off in less than 4 days contact our Monitor department at [phone].  If your monitor becomes loose or falls off after 4 days call [employer] at [phone] for suggestions on securing your monitor.       Follow-Up: At [facility], you and your health needs are our priority.  As part of our continuing mission to provide you with exceptional heart care, we have created designated Provider Care Teams.  These Care Teams include your primary Cardiologist (physician) and Advanced Practice Providers (APPs -  Physician Assistants and Nurse Practitioners) who all work together to provide you with the care you need, when you need it.  We recommend signing up for the patient portal called "MyChart".  Sign up information is provided on this After Visit Summary.  MyChart is used to connect with patients for Virtual Visits (Telemedicine).  Patients are able to view lab/test results, encounter notes, upcoming appointments, etc.  Non-urgent messages can be sent to your provider as well.   To learn more about what you can do with MyChart, go to [email or web address].    Your next  appointment:   3 month(s)  The format for your next appointment:   In Person  Provider:   Berniece Salines, DO   Other Instructions

## 2019-12-22 ENCOUNTER — Other Ambulatory Visit: Payer: Self-pay

## 2019-12-22 DIAGNOSIS — J449 Chronic obstructive pulmonary disease, unspecified: Secondary | ICD-10-CM | POA: Insufficient documentation

## 2019-12-24 ENCOUNTER — Ambulatory Visit: Payer: Medicare Other | Admitting: Cardiology

## 2019-12-25 DIAGNOSIS — B001 Herpesviral vesicular dermatitis: Secondary | ICD-10-CM | POA: Insufficient documentation

## 2019-12-31 DIAGNOSIS — B0229 Other postherpetic nervous system involvement: Secondary | ICD-10-CM | POA: Insufficient documentation

## 2019-12-31 DIAGNOSIS — N760 Acute vaginitis: Secondary | ICD-10-CM | POA: Insufficient documentation

## 2019-12-31 DIAGNOSIS — B9689 Other specified bacterial agents as the cause of diseases classified elsewhere: Secondary | ICD-10-CM | POA: Insufficient documentation

## 2020-01-14 DIAGNOSIS — B0089 Other herpesviral infection: Secondary | ICD-10-CM | POA: Insufficient documentation

## 2020-01-14 DIAGNOSIS — L089 Local infection of the skin and subcutaneous tissue, unspecified: Secondary | ICD-10-CM | POA: Insufficient documentation

## 2020-01-19 DIAGNOSIS — E1142 Type 2 diabetes mellitus with diabetic polyneuropathy: Secondary | ICD-10-CM | POA: Insufficient documentation

## 2020-01-27 ENCOUNTER — Other Ambulatory Visit: Payer: Self-pay

## 2020-01-27 DIAGNOSIS — F99 Mental disorder, not otherwise specified: Secondary | ICD-10-CM | POA: Insufficient documentation

## 2020-01-27 DIAGNOSIS — E119 Type 2 diabetes mellitus without complications: Secondary | ICD-10-CM | POA: Insufficient documentation

## 2020-01-27 DIAGNOSIS — F319 Bipolar disorder, unspecified: Secondary | ICD-10-CM | POA: Insufficient documentation

## 2020-01-27 DIAGNOSIS — J309 Allergic rhinitis, unspecified: Secondary | ICD-10-CM | POA: Insufficient documentation

## 2020-01-28 ENCOUNTER — Other Ambulatory Visit: Payer: Self-pay

## 2020-01-28 ENCOUNTER — Encounter: Payer: Self-pay | Admitting: Cardiology

## 2020-01-28 ENCOUNTER — Ambulatory Visit (INDEPENDENT_AMBULATORY_CARE_PROVIDER_SITE_OTHER): Payer: Medicare Other | Admitting: Cardiology

## 2020-01-28 VITALS — BP 128/78 | HR 88 | Ht 66.0 in | Wt 228.2 lb

## 2020-01-28 DIAGNOSIS — E669 Obesity, unspecified: Secondary | ICD-10-CM | POA: Diagnosis not present

## 2020-01-28 DIAGNOSIS — I10 Essential (primary) hypertension: Secondary | ICD-10-CM

## 2020-01-28 DIAGNOSIS — E782 Mixed hyperlipidemia: Secondary | ICD-10-CM | POA: Diagnosis not present

## 2020-01-28 DIAGNOSIS — R600 Localized edema: Secondary | ICD-10-CM

## 2020-01-28 DIAGNOSIS — E119 Type 2 diabetes mellitus without complications: Secondary | ICD-10-CM

## 2020-01-28 MED ORDER — FUROSEMIDE 20 MG PO TABS: 20.0000 mg | ORAL_TABLET | Freq: Every day | ORAL | 3 refills | Status: DC

## 2020-01-28 MED ORDER — POTASSIUM CHLORIDE CRYS ER 20 MEQ PO TBCR
20.0000 meq | EXTENDED_RELEASE_TABLET | Freq: Every day | ORAL | 3 refills | Status: DC
Start: 2020-01-28 — End: 2020-02-23

## 2020-01-28 NOTE — Patient Instructions (Signed)
Medication Instructions:  Your physician has recommended you make the following change in your medication: 1) START taking Lasix (furosemide) 20 mg daily  2) START taking potassium 20 meq daily  *If you need a refill on your cardiac medications before your next appointment, please call your pharmacy*  Lab Work: TODAY: BMET and magnesium  If you have labs (blood work) drawn today and your tests are completely normal, you will receive your results only by: Marland Kitchen MyChart Message (if you have MyChart) OR . A paper copy in the mail If you have any lab test that is abnormal or we need to change your treatment, we will call you to review the results.  Follow-Up: At [facility], you and your health needs are our priority.  As part of our continuing mission to provide you with exceptional heart care, we have created designated Provider Care Teams.  These Care Teams include your primary Cardiologist (physician) and Advanced Practice Providers (APPs -  Physician Assistants and Nurse Practitioners) who all work together to provide you with the care you need, when you need it.  We recommend signing up for the patient portal called "MyChart".  Sign up information is provided on this After Visit Summary.  MyChart is used to connect with patients for Virtual Visits (Telemedicine).  Patients are able to view lab/test results, encounter notes, upcoming appointments, etc.  Non-urgent messages can be sent to your provider as well.   To learn more about what you can do with MyChart, go to [email or web address].    Your next appointment:   12 week(s)  The format for your next appointment:   In Person  Provider:   Thomasene Ripple, DO

## 2020-01-28 NOTE — Progress Notes (Signed)
Cardiology Office Note:    Date:  01/28/2020   ID:  Meredith Cherry, DOB 29-Jul-1970, MRN [id number]  PCP:  Myrlene Broker, MD  Cardiologist:  Berniece Salines, DO  Electrophysiologist:  None   Referring MD: Myrlene Broker, MD   I still feel depressed, my daughter is in the psych hospital  History of Present Illness:    Meredith Cherry is a 50 y.o. female with a hx.of insulin-dependent diabetes type 2, hypertension, hyperlipidemia, GERD, depression, bipolar disorder with anxiety.  At her last visit I placed a monitor on the patient to rule out any arrhythmias as a cause of her syncope.  There is high suspicion that this was vasovagal. She did wear her monitor reports was an outpatient which was unremarkable with no significant arrhythmia. She is here today she is teary, crying during office visit.  She tells me that she is worried and she is depressed as her daughter has had multiple suicide attempts.  Her daughter is currently in the psych hospital.  She also has been following up with her psychiatrist and have a follow-up appointment in the upcoming weeks. She denies any syncope episode any chest pain or shortness of breath.  ' Past Medical History:  Diagnosis Date  . Acute encephalopathy   . Acute generalized abdominal pain 04/02/2015  . Acute recurrent pansinusitis 11/24/2016  . ADHD (attention deficit hyperactivity disorder)   . Adjustment disorder with mixed anxiety and depressed mood 06/01/2016  . Allergic rhinitis   . Anxiety   . Arrhythmia 09/03/2012  . Asthma   . Bipolar 1 disorder ([place])   . Bipolar affect, depressed ([place]) 02/19/2017  . Borderline personality disorder ([place]) 02/27/2017  . BRBPR (bright red blood per rectum) 12/12/2017  . Bronchitis 09/03/2012  . Chest pain at rest 07/02/2015  . Chest wall pain 05/22/2018  . Chiari malformation type I ([place])   . Chronic migraine   . Colitis 06/14/2018  . Conversion disorder with abnormal movement 08/23/2015  . Decreased activities  of daily living (ADL) 06/16/2019  . Depression   . Diabetes ([place]) 02/26/2017  . Diabetes mellitus without complication ([place])   . Drug-induced hypotension 05/17/2017  . Essential hypertension 11/22/2012  . 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 Gastro-esophageal reflux disease without esophagitis 11/22/2012  . GERD (gastroesophageal reflux disease)   . H/O chest pain 04/02/2015  . Headache, unspecified 09/16/2012  . High risk medication use 09/07/2016  . History of Chiari malformation 11/15/2015  . History of infectious disease 11/22/2012   Formatting of this note might be different from the original. Overview:  H/o cutaneous skin infection  . History of MRSA infection 11/22/2012   Formatting of this note might be different from the original. H/o cutaneous skin infection  . Homicidal ideation 08/23/2016  . Hospital discharge follow-up 11/12/2017  . Hypertension   . Hypertensive disorder 06/16/2019  . Hypertensive urgency 04/02/2015  . Hypokalemia 06/14/2018  . Hyponatremia 06/14/2018  . Hypotension due to hypovolemia 12/12/2016  . Lumbar spondylosis with myelopathy 09/03/2012  . 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 Migraine 02/26/2017  . Migraine without aura 09/03/2012  . Orthostatic hypotension 02/12/2017  . Pain in pelvis 06/16/2019  . Panic attack   . Pharyngitis, chronic 07/29/2018  . Prolonged QT interval 11/15/2015  . Pseudoseizure 09/14/2015  . Pseudoseizures ([place]) 11/14/2015  . Psychiatric disorder   . 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 PTSD (post-traumatic stress disorder) 09/14/2015  . Right leg weakness  04/02/2015  . Seborrheic dermatitis 07/31/2011  . Snoring 12/07/2011  . Subacute frontal sinusitis 09/07/2016  . Supraventricular tachycardia ([place]) 04/04/2017  . Syncope and collapse 11/22/2012  . Type 2 or unspecified type diabetes mellitus 06/08/2015  . Uncontrolled hypertension 02/22/2017  . Urinary retention 04/02/2015   Formatting  of this note might be different from the original. Had retention of 400 cc, 999 cc, 900 cc.    Past Surgical History:  Procedure Laterality Date  . CHOLECYSTECTOMY    . TUBAL LIGATION      Current Medications: Current Meds  Medication Sig  . albuterol (VENTOLIN HFA) 108 (90 Base) MCG/ACT inhaler Inhale 2 puffs into the lungs every 4 (four) hours.  Marland Kitchen azithromycin (ZITHROMAX) 250 MG tablet Take 250 mg by mouth as directed.  . carisoprodol (SOMA) 350 MG tablet Take 350 mg by mouth 3 (three) times daily as needed.  . cephALEXin (KEFLEX) 500 MG capsule Take by mouth.  . chlorthalidone (HYGROTON) 25 MG tablet Take 25 mg by mouth daily.  . cyclobenzaprine (FLEXERIL) 10 MG tablet Take 1 tablet by mouth 3 (three) times daily as needed. For muscle spasms  . divalproex (DEPAKOTE) 500 MG DR tablet Take 500 mg by mouth 2 (two) times daily.  Marland Kitchen doxycycline (VIBRA-TABS) 100 MG tablet Take 100 mg by mouth 2 (two) times daily.  Marland Kitchen EPINEPHrine 0.15 MG/0.15ML IJ injection Inject 0.15 mg into the muscle as needed for anaphylaxis.   Marland Kitchen EPINEPHrine 0.3 mg/0.3 mL IJ SOAJ injection Inject 0.3 mg into the muscle as directed. Inject 0.3 mg into muscle as needed for anaphylaxis.  Eduard Roux (AIMOVIG) 140 MG/ML SOAJ Inject 140 mg into the skin every 30 (thirty) days.  . furosemide (LASIX) 20 MG tablet Take 1 tablet (20 mg total) by mouth daily.  Marland Kitchen gabapentin (NEURONTIN) 300 MG capsule Take 900 mg by mouth 2 (two) times daily.  . insulin aspart (NOVOLOG) 100 UNIT/ML injection Inject into the skin. Inject into the skin daily as needed for High blood sugar.  . ketoconazole (NIZORAL) 2 % cream SMARTSIG:1 Application Topical 1 to 2 Times Daily  . Magnesium 500 MG CAPS Take 500 mg by mouth daily.  . Melatonin 10 MG TABS Take 20 mg by mouth daily.  . meloxicam (MOBIC) 15 MG tablet Take 15 mg by mouth daily.  . metoprolol tartrate (LOPRESSOR) 50 MG tablet Take 1 tablet (50 mg total) by mouth 2 (two) times daily.  .  metroNIDAZOLE (FLAGYL) 250 MG tablet Take 250 mg by mouth 3 (three) times daily.  . montelukast (SINGULAIR) 10 MG tablet Take 10 mg by mouth daily.  . nitroGLYCERIN (NITROSTAT) 0.4 MG SL tablet Place 1 tablet (0.4 mg total) under the tongue as needed. Every 5 minutes as needed for chest pain  . pantoprazole (PROTONIX) 40 MG tablet Take 40 mg by mouth 2 (two) times daily.  . potassium chloride SA (KLOR-CON) 20 MEQ tablet Take 1 tablet (20 mEq total) by mouth daily.  . pravastatin (PRAVACHOL) 40 MG tablet Take 40 mg by mouth daily.  . promethazine (PHENERGAN) 25 MG tablet Take 0.5-1 tab PO Q6HR PRN Nausea, Vomting  . QUEtiapine (SEROQUEL) 300 MG tablet Take by mouth.  . Semaglutide,0.25 or 0.5MG /DOS, (OZEMPIC, 0.25 OR 0.5 MG/DOSE,) 2 MG/1.5ML SOPN Inject into the skin.  Marland Kitchen sertraline (ZOLOFT) 100 MG tablet Take 100 mg by mouth daily.  . tamsulosin (FLOMAX) 0.4 MG CAPS capsule Take 0.4 mg by mouth daily.  Marland Kitchen telmisartan (MICARDIS) 20 MG tablet Take 20  mg by mouth daily.  . traMADol (ULTRAM) 50 MG tablet Take 50 mg by mouth every 6 (six) hours as needed for moderate pain.  Viviana Simpler ELLIPTA 100-62.5-25 MCG/INH AEPB SMARTSIG:1 Inhalation Via Inhaler Daily  . valACYclovir (VALTREX) 1000 MG tablet Take 1,000 mg by mouth 3 (three) times daily.     Allergies:   Ciprofloxacin, Penicillin v, Penicillins, Sulfa antibiotics, and Sulfamethoxazole-trimethoprim   Social History   Socioeconomic History  . Marital status: Married    Spouse name: Not on file  . Number of children: 2  . Years of education: Not on file  . Highest education level: Not on file  Occupational History  . Occupation: unemployed  Tobacco Use  . Smoking status: Never Smoker  . Smokeless tobacco: Never Used  Substance and Sexual Activity  . Alcohol use: No  . Drug use: No  . Sexual activity: Not on file  Other Topics Concern  . Not on file  Social History Narrative  . Not on file   Social Determinants of Health    Financial Resource Strain: Not on file  Food Insecurity: Not on file  Transportation Needs: Not on file  Physical Activity: Not on file  Stress: Not on file  Social Connections: Not on file     Family History: The patient's family history includes Asthma in her brother; Breast cancer in her paternal grandmother; Diabetes in her father and mother; Heart disease in her maternal grandmother and mother; Hypertension in her mother; Stroke in her mother.  ROS:   Review of Systems  Constitution: Negative for decreased appetite, fever and weight gain.  HENT: Negative for congestion, ear discharge, hoarse voice and sore throat.   Eyes: Negative for discharge, redness, vision loss in right eye and visual halos.  Cardiovascular: Negative for chest pain, dyspnea on exertion, leg swelling, orthopnea and palpitations.  Respiratory: Negative for cough, hemoptysis, shortness of breath and snoring.   Endocrine: Negative for heat intolerance and polyphagia.  Hematologic/Lymphatic: Negative for bleeding problem. Does not bruise/bleed easily.  Skin: Negative for flushing, nail changes, rash and suspicious lesions.  Musculoskeletal: Negative for arthritis, joint pain, muscle cramps, myalgias, neck pain and stiffness.  Gastrointestinal: Negative for abdominal pain, bowel incontinence, diarrhea and excessive appetite.  Genitourinary: Negative for decreased libido, genital sores and incomplete emptying.  Neurological: Negative for brief paralysis, focal weakness, headaches and loss of balance.  Psychiatric/Behavioral: Negative for altered mental status, depression and suicidal ideas.  Allergic/Immunologic: Negative for HIV exposure and persistent infections.    EKGs/Labs/Other Studies Reviewed:    The following studies were reviewed today:   EKG: None today  Pharmacologic nuclear stress test  Nuclear stress EF: 66%.  The left ventricular ejection fraction is hyperdynamic (>65%).  There was no  ST segment deviation noted during stress.  This is a low risk study.  Defect 1: There is a small defect of mild severity present in the mid anterior location.  No eidence of ischemia or MI.  Normal gated images with normal EF.  The heart catheterization done [facility] in 2017: have reviewed the recent history and physical documentation. I personally spent 29 minutes continuously monitoring the patient during the administration of moderate sedation. Pre and post activities have been reviewed. I was present for the entire procedure. Signatures Electronically signed by Clarene Critchley, MD, FACC(Diagnostic Physician) on 06/02/2015 16:06 Angiographic findings Cardiac Arteries and Lesion Findings LMCA: Normal appearance with 0% stenosis. LAD: Normal appearance with 0% stenosis. LCx: Normal appearance with  0% stenosis. RCA: Normal appearance with 0% stenosis. Ramus: Normal appearance with 0% stenosis.  Echocardi[facility] done in March 2021 [facility] showed mild concentric left ventricular hypertrophy. Grade 1 diastolic dysfunction. No wall motion abnormalities. The right ventricle was normal size and function. Left atrium was mildly dilated. Right atrium was normal size. Interatrial septum was mobile. Positive bubble study without Valsalva.  Recent Labs: No results found for requested labs within last 8760 hours.  Recent Lipid Panel    Component Value Date/Time   CHOL 164 02/27/2017 0734   TRIG 293 (H) 02/27/2017 0734   HDL 41 02/27/2017 0734   CHOLHDL 4.0 02/27/2017 0734   VLDL 59 (H) 02/27/2017 0734   LDLCALC 64 02/27/2017 0734    Physical Exam:    VS:  BP 128/78   Pulse 88   Ht 5\' 6"  (1.676 m)   Wt 228 lb 3.2 oz (103.5 kg)   SpO2 98%   BMI 36.83 kg/m     Wt Readings from Last 3 Encounters:  01/28/20 228 lb 3.2 oz (103.5 kg)  09/19/19 232 lb (105.2 kg)  07/15/19 238 lb (108 kg)     GEN: Well nourished, well developed in no acute  distress HEENT: Normal NECK: No JVD; No carotid bruits LYMPHATICS: No lymphadenopathy CARDIAC: S1S2 noted,RRR, no murmurs, rubs, gallops RESPIRATORY:  Clear to auscultation without rales, wheezing or rhonchi  ABDOMEN: Soft, non-tender, non-distended, +bowel sounds, no guarding. EXTREMITIES: No edema, No cyanosis, no clubbing MUSCULOSKELETAL:  No deformity  SKIN: Warm and dry NEUROLOGIC:  Alert and oriented x 3, non-focal PSYCHIATRIC:  Normal affect, good insight  ASSESSMENT:    1. Essential hypertension   2. Primary hypertension   3. Diabetes mellitus without complication ([place])   4. Mixed hyperlipidemia   5. Obesity (BMI 30-39.9)   6. Bilateral leg edema    PLAN:     1.  He does have some bilateral leg edema she tells me her legs is continued to swell like to start patient on low-dose Lasix 20 mg daily with potassium supplement.  Lab work will be done today to assess kidney function as well as creatinine.   2.  Her blood pressure is acceptable no changes will be made to her antihypertensive regimen.  3.  The patient understands the need to lose weight with diet and exercise. We have discussed specific strategies for this.  But she has significant depression that needs to be treated.  She is still following with her psychiatrist and sees her in a couple weeks.  She does not have any suicidal or homicidal ideation.  The patient is in agreement with the above plan. The patient left the office in stable condition.  The patient will follow up in   Medication Adjustments/Labs and Tests Ordered: Current medicines are reviewed at length with the patient today.  Concerns regarding medicines are outlined above.  Orders Placed This Encounter  Procedures  . Basic metabolic panel  . Magnesium   Meds ordered this encounter  Medications  . furosemide (LASIX) 20 MG tablet    Sig: Take 1 tablet (20 mg total) by mouth daily.    Dispense:  90 tablet    Refill:  3  . potassium chloride  SA (KLOR-CON) 20 MEQ tablet    Sig: Take 1 tablet (20 mEq total) by mouth daily.    Dispense:  90 tablet    Refill:  3    Patient Instructions  Medication Instructions:  Your physician has recommended you  make the following change in your medication: 1) START taking Lasix (furosemide) 20 mg daily  2) START taking potassium 20 meq daily  *If you need a refill on your cardiac medications before your next appointment, please call your pharmacy*  Lab Work: TODAY: BMET and magnesium  If you have labs (blood work) drawn today and your tests are completely normal, you will receive your results only by: Marland Kitchen MyChart Message (if you have MyChart) OR . A paper copy in the mail If you have any lab test that is abnormal or we need to change your treatment, we will call you to review the results.  Follow-Up: At [facility], you and your health needs are our priority.  As part of our continuing mission to provide you with exceptional heart care, we have created designated Provider Care Teams.  These Care Teams include your primary Cardiologist (physician) and Advanced Practice Providers (APPs -  Physician Assistants and Nurse Practitioners) who all work together to provide you with the care you need, when you need it.  We recommend signing up for the patient portal called "MyChart".  Sign up information is provided on this After Visit Summary.  MyChart is used to connect with patients for Virtual Visits (Telemedicine).  Patients are able to view lab/test results, encounter notes, upcoming appointments, etc.  Non-urgent messages can be sent to your provider as well.   To learn more about what you can do with MyChart, go to [email or web address].    Your next appointment:   12 week(s)  The format for your next appointment:   In Person  Provider:   Thomasene Ripple, DO      Adopting a Healthy Lifestyle.  Know what a healthy weight is for you (roughly BMI <25) and aim to maintain this   Aim for  7+ servings of fruits and vegetables daily   65-80+ fluid ounces of water or unsweet tea for healthy kidneys   Limit to max 1 drink of alcohol per day; avoid smoking/tobacco   Limit animal fats in diet for cholesterol and heart health - choose grass fed whenever available   Avoid highly processed foods, and foods high in saturated/trans fats   Aim for low stress - take time to unwind and care for your mental health   Aim for 150 min of moderate intensity exercise weekly for heart health, and weights twice weekly for bone health   Aim for 7-9 hours of sleep daily   When it comes to diets, agreement about the perfect plan isnt easy to find, even among the experts. Experts at the [facility] [employer] developed an idea known as the Healthy Eating Plate. Just imagine a plate divided into logical, healthy portions.   The emphasis is on diet quality:   Load up on vegetables and fruits - one-half of your plate: Aim for color and variety, and remember that potatoes dont count.   Go for whole grains - one-quarter of your plate: Whole wheat, barley, wheat berries, quinoa, oats, brown rice, and foods made with them. If you want pasta, go with whole wheat pasta.   Protein power - one-quarter of your plate: Fish, chicken, beans, and nuts are all healthy, versatile protein sources. Limit red meat.   The diet, however, does go beyond the plate, offering a few other suggestions.   Use healthy plant oils, such as olive, canola, soy, corn, sunflower and peanut. Check the labels, and avoid partially hydrogenated oil, which have unhealthy trans fats.  If youre thirsty, drink water. Coffee and tea are good in moderation, but skip sugary drinks and limit milk and dairy products to one or two daily servings.   The type of carbohydrate in the diet is more important than the amount. Some sources of carbohydrates, such as vegetables, fruits, whole grains, and beans-are healthier than others.    Finally, stay active  Signed, Berniece Salines, DO  01/28/2020 12:28 PM    [place]

## 2020-01-29 LAB — BASIC METABOLIC PANEL
BUN/Creatinine Ratio: 16 (ref 9–23)
BUN: 12 mg/dL (ref 6–24)
CO2: 25 mmol/L (ref 20–29)
Calcium: 9.5 mg/dL (ref 8.7–10.2)
Chloride: 90 mmol/L — ABNORMAL LOW (ref 96–106)
Creatinine, Ser: 0.75 mg/dL (ref 0.57–1.00)
GFR calc Af Amer: 110 mL/min/{1.73_m2} (ref >59)
GFR calc non Af Amer: 95 mL/min/{1.73_m2} (ref >59)
Glucose: 138 mg/dL — ABNORMAL HIGH (ref 65–99)
Potassium: 4.4 mmol/L (ref 3.5–5.2)
Sodium: 135 mmol/L (ref 134–144)

## 2020-01-29 LAB — MAGNESIUM: Magnesium: 1.8 mg/dL (ref 1.6–2.3)

## 2020-02-02 ENCOUNTER — Telehealth: Payer: Self-pay | Admitting: Cardiology

## 2020-02-02 NOTE — Telephone Encounter (Signed)
Spoke to the patient just now and she let me know that she has been lightheaded and has been having a significant amount of blood in her stools.   I advised her that the lightheadedness is more than likely due to the blood loss and that she needs to be seen by either her PCP or go to urgent care to be seen today.  She verbalizes understanding and thanks me for the call back.

## 2020-02-02 NOTE — Telephone Encounter (Signed)
Pt c/o medication issue:  1. Name of Medication: furosemide (LASIX) 20 MG tablet and potassium chloride SA (KLOR-CON) 20 MEQ tablet   2. How are you currently taking this medication (dosage and times per day)? As directed  3. Are you having a reaction (difficulty breathing--STAT)? yes  4. What is your medication issue? Patient was recently prescribed this medication. States she has been feeling lightheaded to the point she feels like she may pass out. She also states that she has blood in her stool that that the blood feels up the whole toilet. Please advise.

## 2020-02-23 ENCOUNTER — Ambulatory Visit: Payer: Medicare Other | Admitting: Cardiology

## 2020-02-23 ENCOUNTER — Other Ambulatory Visit: Payer: Self-pay

## 2020-02-23 ENCOUNTER — Encounter: Payer: Self-pay | Admitting: Cardiology

## 2020-02-23 VITALS — BP 130/88 | HR 88 | Ht 66.0 in | Wt 229.4 lb

## 2020-02-23 DIAGNOSIS — I10 Essential (primary) hypertension: Secondary | ICD-10-CM | POA: Diagnosis not present

## 2020-02-23 DIAGNOSIS — E1142 Type 2 diabetes mellitus with diabetic polyneuropathy: Secondary | ICD-10-CM

## 2020-02-23 DIAGNOSIS — E782 Mixed hyperlipidemia: Secondary | ICD-10-CM

## 2020-02-23 DIAGNOSIS — E0801 Diabetes mellitus due to underlying condition with hyperosmolarity with coma: Secondary | ICD-10-CM

## 2020-02-23 DIAGNOSIS — E669 Obesity, unspecified: Secondary | ICD-10-CM

## 2020-02-23 DIAGNOSIS — F330 Major depressive disorder, recurrent, mild: Secondary | ICD-10-CM | POA: Diagnosis not present

## 2020-02-23 NOTE — Progress Notes (Signed)
Cardiology Office Note:    Date:  02/23/2020   ID:  Meredith Cherry, DOB 12/21/1970, MRN [id number]  PCP:  Myrlene Broker, MD  Cardiologist:  Berniece Salines, DO  Electrophysiologist:  None   Referring MD: Myrlene Broker, MD   I am okay  History of Present Illness:    Meredith Cherry is a 50 y.o. female with a hx of insulin-dependent diabetes type 2, hypertension, hyperlipidemia, GERD, depression, bipolar disorder with anxiety.  At her last visit I started patient on low-dose Lasix along with potassium supplement.  As she did have bilateral leg edema.  She also had significant stress from her daughter being in and out of psychiatric hospital.  She was clearly depressed at that time and was still following up with her psychiatrist.  Today she tells me she she has some stress but she has been following up with psychiatry and is helping. She is here in the office with her daughter.  Past Medical History:  Diagnosis Date  . Acute encephalopathy   . Acute generalized abdominal pain 04/02/2015  . Acute recurrent pansinusitis 11/24/2016  . ADHD (attention deficit hyperactivity disorder)   . Adjustment disorder with mixed anxiety and depressed mood 06/01/2016  . Allergic rhinitis   . Anxiety   . Arrhythmia 09/03/2012  . Asthma   . Bipolar 1 disorder ([place])   . Bipolar affect, depressed ([place]) 02/19/2017  . Borderline personality disorder ([place]) 02/27/2017  . BRBPR (bright red blood per rectum) 12/12/2017  . Bronchitis 09/03/2012  . Chest pain at rest 07/02/2015  . Chest wall pain 05/22/2018  . Chiari malformation type I ([place])   . Chronic migraine   . Colitis 06/14/2018  . Conversion disorder with abnormal movement 08/23/2015  . Decreased activities of daily living (ADL) 06/16/2019  . Depression   . Diabetes ([place]) 02/26/2017  . Diabetes mellitus without complication ([place])   . Drug-induced hypotension 05/17/2017  . Essential hypertension 11/22/2012  . 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  Gastro-esophageal reflux disease without esophagitis 11/22/2012  . GERD (gastroesophageal reflux disease)   . H/O chest pain 04/02/2015  . Headache, unspecified 09/16/2012  . High risk medication use 09/07/2016  . History of Chiari malformation 11/15/2015  . History of infectious disease 11/22/2012   Formatting of this note might be different from the original. Overview:  H/o cutaneous skin infection  . History of MRSA infection 11/22/2012   Formatting of this note might be different from the original. H/o cutaneous skin infection  . Homicidal ideation 08/23/2016  . Hospital discharge follow-up 11/12/2017  . Hypertension   . Hypertensive disorder 06/16/2019  . Hypertensive urgency 04/02/2015  . Hypokalemia 06/14/2018  . Hyponatremia 06/14/2018  . Hypotension due to hypovolemia 12/12/2016  . Lumbar spondylosis with myelopathy 09/03/2012  . Major depressive disorder, recurrent severe without psychotic features ([place]) 02/26/2017  . Migraine 02/26/2017  . Migraine without aura 09/03/2012  . Orthostatic hypotension 02/12/2017  . Pain in pelvis 06/16/2019  . Panic attack   . Pharyngitis, chronic 07/29/2018  . Prolonged QT interval 11/15/2015  . Pseudoseizure 09/14/2015  . Pseudoseizures ([place]) 11/14/2015  . Psychiatric disorder   . 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 PTSD (post-traumatic stress disorder) 09/14/2015  . Right leg weakness 04/02/2015  . Seborrheic dermatitis 07/31/2011  . Snoring 12/07/2011  . Subacute frontal sinusitis 09/07/2016  . Supraventricular tachycardia ([place]) 04/04/2017  . Syncope and collapse 11/22/2012  . Type 2 or unspecified type diabetes mellitus 06/08/2015  . Uncontrolled hypertension  02/22/2017  . Urinary retention 04/02/2015   Formatting of this note might be different from the original. Had retention of 400 cc, 999 cc, 900 cc.    Past Surgical History:  Procedure Laterality Date  . CHOLECYSTECTOMY    . TUBAL LIGATION      Current Medications: Current Meds   Medication Sig  . albuterol (VENTOLIN HFA) 108 (90 Base) MCG/ACT inhaler Inhale 2 puffs into the lungs every 4 (four) hours.  . carisoprodol (SOMA) 350 MG tablet Take 350 mg by mouth 3 (three) times daily as needed.  . cephALEXin (KEFLEX) 500 MG capsule Take by mouth.  . cyclobenzaprine (FLEXERIL) 10 MG tablet Take 1 tablet by mouth 3 (three) times daily as needed. For muscle spasms  . divalproex (DEPAKOTE) 500 MG DR tablet Take 500 mg by mouth 2 (two) times daily.  Marland Kitchen EPINEPHrine 0.15 MG/0.15ML IJ injection Inject 0.15 mg into the muscle as needed for anaphylaxis.   Marland Kitchen EPINEPHrine 0.3 mg/0.3 mL IJ SOAJ injection Inject 0.3 mg into the muscle as directed. Inject 0.3 mg into muscle as needed for anaphylaxis.  Eduard Roux (AIMOVIG) 140 MG/ML SOAJ Inject 140 mg into the skin every 30 (thirty) days.  Marland Kitchen gabapentin (NEURONTIN) 300 MG capsule Take 900 mg by mouth 2 (two) times daily.  . insulin aspart (NOVOLOG) 100 UNIT/ML injection Inject into the skin. Inject into the skin daily as needed for High blood sugar.  . ketoconazole (NIZORAL) 2 % cream SMARTSIG:1 Application Topical 1 to 2 Times Daily  . Magnesium 500 MG CAPS Take 500 mg by mouth daily.  . Melatonin 10 MG TABS Take 20 mg by mouth daily.  . meloxicam (MOBIC) 15 MG tablet Take 15 mg by mouth daily.  . metoprolol tartrate (LOPRESSOR) 50 MG tablet Take 1 tablet (50 mg total) by mouth 2 (two) times daily.  . montelukast (SINGULAIR) 10 MG tablet Take 10 mg by mouth daily.  . nitroGLYCERIN (NITROSTAT) 0.4 MG SL tablet Place 1 tablet (0.4 mg total) under the tongue as needed. Every 5 minutes as needed for chest pain  . pantoprazole (PROTONIX) 40 MG tablet Take 40 mg by mouth 2 (two) times daily.  . pravastatin (PRAVACHOL) 40 MG tablet Take 40 mg by mouth daily.  . promethazine (PHENERGAN) 25 MG tablet Take 0.5-1 tab PO Q6HR PRN Nausea, Vomting  . QUEtiapine (SEROQUEL) 300 MG tablet Take by mouth.  . Semaglutide,0.25 or 0.5MG /DOS, (OZEMPIC,  0.25 OR 0.5 MG/DOSE,) 2 MG/1.5ML SOPN Inject into the skin.  Marland Kitchen sertraline (ZOLOFT) 100 MG tablet Take 100 mg by mouth daily.  . tamsulosin (FLOMAX) 0.4 MG CAPS capsule Take 0.4 mg by mouth daily.  Marland Kitchen telmisartan (MICARDIS) 20 MG tablet Take 20 mg by mouth daily.  . traMADol (ULTRAM) 50 MG tablet Take 50 mg by mouth every 6 (six) hours as needed for moderate pain.  Viviana Simpler ELLIPTA 100-62.5-25 MCG/INH AEPB SMARTSIG:1 Inhalation Via Inhaler Daily     Allergies:   Ciprofloxacin, Penicillin v, Penicillins, Sulfa antibiotics, and Sulfamethoxazole-trimethoprim   Social History   Socioeconomic History  . Marital status: Married    Spouse name: Not on file  . Number of children: 2  . Years of education: Not on file  . Highest education level: Not on file  Occupational History  . Occupation: unemployed  Tobacco Use  . Smoking status: Never Smoker  . Smokeless tobacco: Never Used  Substance and Sexual Activity  . Alcohol use: No  . Drug use: No  . Sexual  activity: Not on file  Other Topics Concern  . Not on file  Social History Narrative  . Not on file   Social Determinants of Health   Financial Resource Strain: Not on file  Food Insecurity: Not on file  Transportation Needs: Not on file  Physical Activity: Not on file  Stress: Not on file  Social Connections: Not on file     Family History: The patient's family history includes Asthma in her brother; Breast cancer in her paternal grandmother; Diabetes in her father and mother; Heart disease in her maternal grandmother and mother; Hypertension in her mother; Stroke in her mother.  ROS:   Review of Systems  Constitution: Negative for decreased appetite, fever and weight gain.  HENT: Negative for congestion, ear discharge, hoarse voice and sore throat.   Eyes: Negative for discharge, redness, vision loss in right eye and visual halos.  Cardiovascular: Negative for chest pain, dyspnea on exertion, leg swelling, orthopnea and  palpitations.  Respiratory: Negative for cough, hemoptysis, shortness of breath and snoring.   Endocrine: Negative for heat intolerance and polyphagia.  Hematologic/Lymphatic: Negative for bleeding problem. Does not bruise/bleed easily.  Skin: Negative for flushing, nail changes, rash and suspicious lesions.  Musculoskeletal: Negative for arthritis, joint pain, muscle cramps, myalgias, neck pain and stiffness.  Gastrointestinal: Negative for abdominal pain, bowel incontinence, diarrhea and excessive appetite.  Genitourinary: Negative for decreased libido, genital sores and incomplete emptying.  Neurological: Negative for brief paralysis, focal weakness, headaches and loss of balance.  Psychiatric/Behavioral: Negative for altered mental status, depression and suicidal ideas.  Allergic/Immunologic: Negative for HIV exposure and persistent infections.    EKGs/Labs/Other Studies Reviewed:    The following studies were reviewed today:   EKG: None today  Recent Labs: 01/28/2020: BUN 12; Creatinine, Ser 0.75; Magnesium 1.8; Potassium 4.4; Sodium 135  Recent Lipid Panel    Component Value Date/Time   CHOL 164 02/27/2017 0734   TRIG 293 (H) 02/27/2017 0734   HDL 41 02/27/2017 0734   CHOLHDL 4.0 02/27/2017 0734   VLDL 59 (H) 02/27/2017 0734   LDLCALC 64 02/27/2017 0734    Physical Exam:    VS:  BP 130/88   Pulse 88   Ht 5\' 6"  (1.676 m)   Wt 229 lb 6.4 oz (104.1 kg)   SpO2 97%   BMI 37.03 kg/m     Wt Readings from Last 3 Encounters:  02/23/20 229 lb 6.4 oz (104.1 kg)  01/28/20 228 lb 3.2 oz (103.5 kg)  09/19/19 232 lb (105.2 kg)     GEN: Well nourished, well developed in no acute distress HEENT: Normal NECK: No JVD; No carotid bruits LYMPHATICS: No lymphadenopathy CARDIAC: S1S2 noted,RRR, no murmurs, rubs, gallops RESPIRATORY:  Clear to auscultation without rales, wheezing or rhonchi  ABDOMEN: Soft, non-tender, non-distended, +bowel sounds, no guarding. EXTREMITIES: No  edema, No cyanosis, no clubbing MUSCULOSKELETAL:  No deformity  SKIN: Warm and dry NEUROLOGIC:  Alert and oriented x 3, non-focal PSYCHIATRIC:  Normal affect, good insight  ASSESSMENT:    1. Primary hypertension   2. Diabetic peripheral neuropathy ([place])   3. Diabetes mellitus due to underlying condition with hyperosmolar coma, unspecified whether long term insulin use ([place])   4. Major depressive disorder, recurrent, mild ([place])   5. Mixed hyperlipidemia   6. Obesity (BMI 30-39.9)    PLAN:     1.  Her bilateral leg edema has resolved.  We will continue patient on current regimen.  Her blood pressure also is acceptable  in the office.  2.  Hyperlipidemia - continue with current statin medication.  3.  The patient understands the need to lose weight with diet and exercise. We have discussed specific strategies for this.  4.  This is being managed by his primary care doctor.  No adjustments for antidiabetic medications were made today.  5.  She is follow-up with her psychiatry.  The patient is in agreement with the above plan. The patient left the office in stable condition.  The patient will follow up in   Medication Adjustments/Labs and Tests Ordered: Current medicines are reviewed at length with the patient today.  Concerns regarding medicines are outlined above.  No orders of the defined types were placed in this encounter.  No orders of the defined types were placed in this encounter.   Patient Instructions  Medication Instructions:  Your physician recommends that you continue on your current medications as directed. Please refer to the Current Medication list given to you today.  *If you need a refill on your cardiac medications before your next appointment, please call your pharmacy*   Lab Work: None If you have labs (blood work) drawn today and your tests are completely normal, you will receive your results only by: Marland Kitchen MyChart Message (if you have MyChart) OR . A  paper copy in the mail If you have any lab test that is abnormal or we need to change your treatment, we will call you to review the results.   Testing/Procedures: None   Follow-Up: At [facility], you and your health needs are our priority.  As part of our continuing mission to provide you with exceptional heart care, we have created designated Provider Care Teams.  These Care Teams include your primary Cardiologist (physician) and Advanced Practice Providers (APPs -  Physician Assistants and Nurse Practitioners) who all work together to provide you with the care you need, when you need it.  We recommend signing up for the patient portal called "MyChart".  Sign up information is provided on this After Visit Summary.  MyChart is used to connect with patients for Virtual Visits (Telemedicine).  Patients are able to view lab/test results, encounter notes, upcoming appointments, etc.  Non-urgent messages can be sent to your provider as well.   To learn more about what you can do with MyChart, go to [email or web address].    Your next appointment:   6 month(s)  The format for your next appointment:   In Person  Provider:   Berniece Salines, DO   Other Instructions      Adopting a Healthy Lifestyle.  Know what a healthy weight is for you (roughly BMI <25) and aim to maintain this   Aim for 7+ servings of fruits and vegetables daily   65-80+ fluid ounces of water or unsweet tea for healthy kidneys   Limit to max 1 drink of alcohol per day; avoid smoking/tobacco   Limit animal fats in diet for cholesterol and heart health - choose grass fed whenever available   Avoid highly processed foods, and foods high in saturated/trans fats   Aim for low stress - take time to unwind and care for your mental health   Aim for 150 min of moderate intensity exercise weekly for heart health, and weights twice weekly for bone health   Aim for 7-9 hours of sleep daily   When it comes to diets,  agreement about the perfect plan isnt easy to find, even among the experts. Experts at the [employer] developed an idea known as the Healthy Eating Plate. Just imagine a plate divided into logical, healthy portions.   The emphasis is on diet quality:   Load up on vegetables and fruits - one-half of your plate: Aim for color and variety, and remember that potatoes dont count.   Go for whole grains - one-quarter of your plate: Whole wheat, barley, wheat berries, quinoa, oats, brown rice, and foods made with them. If you want pasta, go with whole wheat pasta.   Protein power - one-quarter of your plate: Fish, chicken, beans, and nuts are all healthy, versatile protein sources. Limit red meat.   The diet, however, does go beyond the plate, offering a few other suggestions.   Use healthy plant oils, such as olive, canola, soy, corn, sunflower and peanut. Check the labels, and avoid partially hydrogenated oil, which have unhealthy trans fats.   If youre thirsty, drink water. Coffee and tea are good in moderation, but skip sugary drinks and limit milk and dairy products to one or two daily servings.   The type of carbohydrate in the diet is more important than the amount. Some sources of carbohydrates, such as vegetables, fruits, whole grains, and beans-are healthier than others.   Finally, stay active  Signed, Berniece Salines, DO  02/23/2020 4:46 PM    [facility]

## 2020-02-23 NOTE — Patient Instructions (Signed)

## 2020-04-21 ENCOUNTER — Ambulatory Visit: Payer: Medicare Other | Admitting: Cardiology

## 2020-05-17 DIAGNOSIS — E669 Obesity, unspecified: Secondary | ICD-10-CM | POA: Diagnosis not present

## 2020-05-17 DIAGNOSIS — E1169 Type 2 diabetes mellitus with other specified complication: Secondary | ICD-10-CM | POA: Diagnosis not present

## 2020-05-17 DIAGNOSIS — I160 Hypertensive urgency: Secondary | ICD-10-CM | POA: Diagnosis not present

## 2020-05-17 DIAGNOSIS — R079 Chest pain, unspecified: Secondary | ICD-10-CM | POA: Diagnosis not present

## 2020-05-17 DIAGNOSIS — E785 Hyperlipidemia, unspecified: Secondary | ICD-10-CM

## 2020-06-11 ENCOUNTER — Telehealth: Payer: Self-pay

## 2020-06-11 NOTE — Telephone Encounter (Signed)
Called patient, she is added to the schedule. See chart.

## 2020-06-29 ENCOUNTER — Other Ambulatory Visit: Payer: Self-pay

## 2020-06-29 ENCOUNTER — Ambulatory Visit: Payer: Medicare Other | Admitting: Cardiology

## 2020-06-29 ENCOUNTER — Encounter: Payer: Self-pay | Admitting: Cardiology

## 2020-06-29 VITALS — BP 140/88 | HR 93 | Ht 66.0 in | Wt 233.0 lb

## 2020-06-29 DIAGNOSIS — I471 Supraventricular tachycardia: Secondary | ICD-10-CM | POA: Diagnosis not present

## 2020-06-29 DIAGNOSIS — E782 Mixed hyperlipidemia: Secondary | ICD-10-CM

## 2020-06-29 DIAGNOSIS — R600 Localized edema: Secondary | ICD-10-CM

## 2020-06-29 DIAGNOSIS — E119 Type 2 diabetes mellitus without complications: Secondary | ICD-10-CM

## 2020-06-29 DIAGNOSIS — I10 Essential (primary) hypertension: Secondary | ICD-10-CM | POA: Diagnosis not present

## 2020-06-29 DIAGNOSIS — E669 Obesity, unspecified: Secondary | ICD-10-CM

## 2020-06-29 MED ORDER — FUROSEMIDE 20 MG PO TABS: 20.0000 mg | ORAL_TABLET | Freq: Every day | ORAL | 3 refills | Status: DC

## 2020-06-29 MED ORDER — POTASSIUM CHLORIDE ER 10 MEQ PO TBCR: 10.0000 meq | EXTENDED_RELEASE_TABLET | Freq: Every day | ORAL | 3 refills | Status: DC

## 2020-06-29 NOTE — Progress Notes (Signed)
Cardiology Office Note:    Date:  06/29/2020   ID:  Meredith Cherry, DOB 02-Oct-1970, MRN [id number]  PCP:  Myrlene Broker, MD  Cardiologist:  Berniece Salines, DO  Electrophysiologist:  None   Referring MD: Myrlene Broker, MD   " I am to a lot better having were having a good day today"  History of Present Illness:    Meredith Cherry is a 50 y.o. female with a hx of  insulin-dependent diabetes type 2, hypertension, hyperlipidemia, GERD, depression, bipolar disorder with anxiety.  I last saw the patient 1 February 23, 2020 at that time I continued her Lasix.  But it appears since I saw her she had stopped this medication.  She denies any chest pain any shortness of breath.  Since her last visit she was seen at [facility] for chest pain she underwent a nuclear stress test this was normal.  She is here today in the office with her daughter.  Past Medical History:  Diagnosis Date  . Acute encephalopathy   . Acute generalized abdominal pain 04/02/2015  . Acute recurrent pansinusitis 11/24/2016  . ADHD (attention deficit hyperactivity disorder)   . Adjustment disorder with mixed anxiety and depressed mood 06/01/2016  . Allergic rhinitis   . Anxiety   . Arrhythmia 09/03/2012  . Asthma   . Bipolar 1 disorder ([place])   . Bipolar affect, depressed ([place]) 02/19/2017  . Borderline personality disorder ([place]) 02/27/2017  . BRBPR (bright red blood per rectum) 12/12/2017  . Bronchitis 09/03/2012  . Chest pain at rest 07/02/2015  . Chest wall pain 05/22/2018  . Chiari malformation type I ([place])   . Chronic migraine   . Colitis 06/14/2018  . Conversion disorder with abnormal movement 08/23/2015  . Decreased activities of daily living (ADL) 06/16/2019  . Depression   . Diabetes ([place]) 02/26/2017  . Diabetes mellitus without complication ([place])   . Drug-induced hypotension 05/17/2017  . Essential hypertension 11/22/2012  . 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 Gastro-esophageal reflux disease without  esophagitis 11/22/2012  . GERD (gastroesophageal reflux disease)   . H/O chest pain 04/02/2015  . Headache, unspecified 09/16/2012  . High risk medication use 09/07/2016  . History of Chiari malformation 11/15/2015  . History of infectious disease 11/22/2012   Formatting of this note might be different from the original. Overview:  H/o cutaneous skin infection  . History of MRSA infection 11/22/2012   Formatting of this note might be different from the original. H/o cutaneous skin infection  . Homicidal ideation 08/23/2016  . Hospital discharge follow-up 11/12/2017  . Hypertension   . Hypertensive disorder 06/16/2019  . Hypertensive urgency 04/02/2015  . Hypokalemia 06/14/2018  . Hyponatremia 06/14/2018  . Hypotension due to hypovolemia 12/12/2016  . Lumbar spondylosis with myelopathy 09/03/2012  . 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 Migraine 02/26/2017  . Migraine without aura 09/03/2012  . Orthostatic hypotension 02/12/2017  . Pain in pelvis 06/16/2019  . Panic attack   . Pharyngitis, chronic 07/29/2018  . Prolonged QT interval 11/15/2015  . Pseudoseizure 09/14/2015  . Pseudoseizures ([place]) 11/14/2015  . Psychiatric disorder   . 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 PTSD (post-traumatic stress disorder) 09/14/2015  . Right leg weakness 04/02/2015  . Seborrheic dermatitis 07/31/2011  . Snoring 12/07/2011  . Subacute frontal sinusitis 09/07/2016  . Supraventricular tachycardia ([place]) 04/04/2017  . Syncope and collapse 11/22/2012  . Type 2 or unspecified type diabetes mellitus 06/08/2015  . Uncontrolled  hypertension 02/22/2017  . Urinary retention 04/02/2015   Formatting of this note might be different from the original. Had retention of 400 cc, 999 cc, 900 cc.    Past Surgical History:  Procedure Laterality Date  . CHOLECYSTECTOMY    . TUBAL LIGATION      Current Medications: Current Meds  Medication Sig  . albuterol (VENTOLIN HFA) 108  (90 Base) MCG/ACT inhaler Inhale 2 puffs into the lungs every 4 (four) hours.  . carisoprodol (SOMA) 350 MG tablet Take 350 mg by mouth 3 (three) times daily as needed.  . cyclobenzaprine (FLEXERIL) 10 MG tablet Take 1 tablet by mouth 3 (three) times daily as needed. For muscle spasms  . divalproex (DEPAKOTE) 500 MG DR tablet Take 500 mg by mouth 2 (two) times daily.  Marland Kitchen EPINEPHrine 0.15 MG/0.15ML IJ injection Inject 0.15 mg into the muscle as needed for anaphylaxis.   Marland Kitchen EPINEPHrine 0.3 mg/0.3 mL IJ SOAJ injection Inject 0.3 mg into the muscle as directed. Inject 0.3 mg into muscle as needed for anaphylaxis.  Eduard Roux (AIMOVIG) 140 MG/ML SOAJ Inject 140 mg into the skin every 30 (thirty) days.  . ferrous sulfate 325 (65 FE) MG tablet Take 325 mg by mouth daily.  . furosemide (LASIX) 20 MG tablet Take 1 tablet (20 mg total) by mouth daily.  Marland Kitchen gabapentin (NEURONTIN) 300 MG capsule Take 900 mg by mouth 2 (two) times daily.  . insulin aspart (NOVOLOG) 100 UNIT/ML injection Inject into the skin. Inject into the skin daily as needed for High blood sugar.  . ketoconazole (NIZORAL) 2 % cream SMARTSIG:1 Application Topical 1 to 2 Times Daily  . Magnesium 500 MG CAPS Take 500 mg by mouth daily.  . Melatonin 10 MG TABS Take 20 mg by mouth daily.  . metoprolol tartrate (LOPRESSOR) 50 MG tablet Take 1 tablet (50 mg total) by mouth 2 (two) times daily.  . nitroGLYCERIN (NITROSTAT) 0.4 MG SL tablet Place 1 tablet (0.4 mg total) under the tongue as needed. Every 5 minutes as needed for chest pain  . pantoprazole (PROTONIX) 40 MG tablet Take 40 mg by mouth 2 (two) times daily.  . potassium chloride (KLOR-CON) 10 MEQ tablet Take 1 tablet (10 mEq total) by mouth daily.  . pravastatin (PRAVACHOL) 40 MG tablet Take 40 mg by mouth daily.  . promethazine (PHENERGAN) 25 MG tablet Take 0.5-1 tab PO Q6HR PRN Nausea, Vomting  . QUEtiapine (SEROQUEL) 300 MG tablet Take by mouth.  . Semaglutide,0.25 or 0.5MG /DOS,  (OZEMPIC, 0.25 OR 0.5 MG/DOSE,) 2 MG/1.5ML SOPN Inject into the skin.  Marland Kitchen sertraline (ZOLOFT) 100 MG tablet Take 100 mg by mouth daily.  Marland Kitchen telmisartan (MICARDIS) 20 MG tablet Take 20 mg by mouth daily.  . traMADol (ULTRAM) 50 MG tablet Take 50 mg by mouth every 6 (six) hours as needed for moderate pain.  Viviana Simpler ELLIPTA 100-62.5-25 MCG/INH AEPB SMARTSIG:1 Inhalation Via Inhaler Daily     Allergies:   Ciprofloxacin, Penicillin v, Penicillins, Sulfa antibiotics, and Sulfamethoxazole-trimethoprim   Social History   Socioeconomic History  . Marital status: Married    Spouse name: Not on file  . Number of children: 2  . Years of education: Not on file  . Highest education level: Not on file  Occupational History  . Occupation: unemployed  Tobacco Use  . Smoking status: Never Smoker  . Smokeless tobacco: Never Used  Substance and Sexual Activity  . Alcohol use: No  . Drug use: No  . Sexual activity:  Not on file  Other Topics Concern  . Not on file  Social History Narrative  . Not on file   Social Determinants of Health   Financial Resource Strain: Not on file  Food Insecurity: Not on file  Transportation Needs: Not on file  Physical Activity: Not on file  Stress: Not on file  Social Connections: Not on file     Family History: The patient's family history includes Asthma in her brother; Breast cancer in her paternal grandmother; Diabetes in her father and mother; Heart disease in her maternal grandmother and mother; Hypertension in her mother; Stroke in her mother.  ROS:   Review of Systems  Constitution: Negative for decreased appetite, fever and weight gain.  HENT: Negative for congestion, ear discharge, hoarse voice and sore throat.   Eyes: Negative for discharge, redness, vision loss in right eye and visual halos.  Cardiovascular: Negative for chest pain, dyspnea on exertion, leg swelling, orthopnea and palpitations.  Respiratory: Negative for cough, hemoptysis,  shortness of breath and snoring.   Endocrine: Negative for heat intolerance and polyphagia.  Hematologic/Lymphatic: Negative for bleeding problem. Does not bruise/bleed easily.  Skin: Negative for flushing, nail changes, rash and suspicious lesions.  Musculoskeletal: Negative for arthritis, joint pain, muscle cramps, myalgias, neck pain and stiffness.  Gastrointestinal: Negative for abdominal pain, bowel incontinence, diarrhea and excessive appetite.  Genitourinary: Negative for decreased libido, genital sores and incomplete emptying.  Neurological: Negative for brief paralysis, focal weakness, headaches and loss of balance.  Psychiatric/Behavioral: Negative for altered mental status, depression and suicidal ideas.  Allergic/Immunologic: Negative for HIV exposure and persistent infections.    EKGs/Labs/Other Studies Reviewed:    The following studies were reviewed today:   EKG: Sinus rhythm, heart rate 93 bpm  Recent Labs: 01/28/2020: BUN 12; Creatinine, Ser 0.75; Magnesium 1.8; Potassium 4.4; Sodium 135  Recent Lipid Panel    Component Value Date/Time   CHOL 164 02/27/2017 0734   TRIG 293 (H) 02/27/2017 0734   HDL 41 02/27/2017 0734   CHOLHDL 4.0 02/27/2017 0734   VLDL 59 (H) 02/27/2017 0734   LDLCALC 64 02/27/2017 0734    Physical Exam:    VS:  BP 140/88   Pulse 93   Ht 5\' 6"  (1.676 m)   Wt 233 lb (105.7 kg)   SpO2 95%   BMI 37.61 kg/m     Wt Readings from Last 3 Encounters:  06/29/20 233 lb (105.7 kg)  02/23/20 229 lb 6.4 oz (104.1 kg)  01/28/20 228 lb 3.2 oz (103.5 kg)     GEN: Well nourished, well developed in no acute distress HEENT: Normal NECK: No JVD; No carotid bruits LYMPHATICS: No lymphadenopathy CARDIAC: S1S2 noted,RRR, no murmurs, rubs, gallops RESPIRATORY:  Clear to auscultation without rales, wheezing or rhonchi  ABDOMEN: Soft, non-tender, non-distended, +bowel sounds, no guarding. EXTREMITIES: +2 bilateral leg edema edema, No cyanosis, no  clubbing MUSCULOSKELETAL:  No deformity  SKIN: Warm and dry NEUROLOGIC:  Alert and oriented x 3, non-focal PSYCHIATRIC:  Normal affect, good insight  ASSESSMENT:    1. Leg edema   2. Essential hypertension   3. Supraventricular tachycardia ([place])   4. Diabetes mellitus without complication ([place])   5. Mixed hyperlipidemia   6. Obesity (BMI 30-39.9)    PLAN:     1.  She does have some bilateral leg edema today.  We will going to restart her Lasix 20 mg daily with potassium supplement.  We will get blood work today for [employer] and mag.  2.  No chest pain we will continue her current medication.  3.  This is being managed by his primary care doctor.  No adjustments for antidiabetic medications were made today.  4.  The patient understands the need to lose weight with diet and exercise. We have discussed specific strategies for this. 5.  Hyperlipidemia - continue with current statin medication.  The patient is in agreement with the above plan. The patient left the office in stable condition.  The patient will follow up in 3 months   Medication Adjustments/Labs and Tests Ordered: Current medicines are reviewed at length with the patient today.  Concerns regarding medicines are outlined above.  Orders Placed This Encounter  Procedures  . Basic metabolic panel  . Magnesium  . EKG 12-Lead   Meds ordered this encounter  Medications  . furosemide (LASIX) 20 MG tablet    Sig: Take 1 tablet (20 mg total) by mouth daily.    Dispense:  90 tablet    Refill:  3  . potassium chloride (KLOR-CON) 10 MEQ tablet    Sig: Take 1 tablet (10 mEq total) by mouth daily.    Dispense:  90 tablet    Refill:  3    Patient Instructions  Medication Instructions:  Your physician has recommended you make the following change in your medication:  START: Lasix 20 mg take one tablet by mouth daily.  START: Potassium 10 meq take one tablet by mouth daily.  *If you need a refill on your cardiac medications  before your next appointment, please call your pharmacy*   Lab Work: Your physician recommends that you return for lab work in: [place] If you have labs (blood work) drawn today and your tests are completely normal, you will receive your results only by: Marland Kitchen MyChart Message (if you have MyChart) OR . A paper copy in the mail If you have any lab test that is abnormal or we need to change your treatment, we will call you to review the results.   Testing/Procedures: None   Follow-Up: At [facility], you and your health needs are our priority.  As part of our continuing mission to provide you with exceptional heart care, we have created designated Provider Care Teams.  These Care Teams include your primary Cardiologist (physician) and Advanced Practice Providers (APPs -  Physician Assistants and Nurse Practitioners) who all work together to provide you with the care you need, when you need it.  We recommend signing up for the patient portal called "MyChart".  Sign up information is provided on this After Visit Summary.  MyChart is used to connect with patients for Virtual Visits (Telemedicine).  Patients are able to view lab/test results, encounter notes, upcoming appointments, etc.  Non-urgent messages can be sent to your provider as well.   To learn more about what you can do with MyChart, go to [email or web address].    Your next appointment:   3 month(s)  The format for your next appointment:   In Person  Provider:   Berniece Salines, DO   Other Instructions      Adopting a Healthy Lifestyle.  Know what a healthy weight is for you (roughly BMI <25) and aim to maintain this   Aim for 7+ servings of fruits and vegetables daily   65-80+ fluid ounces of water or unsweet tea for healthy kidneys   Limit to max 1 drink of alcohol per day; avoid smoking/tobacco   Limit animal fats in diet for cholesterol  and heart health - choose grass fed whenever available   Avoid  highly processed foods, and foods high in saturated/trans fats   Aim for low stress - take time to unwind and care for your mental health   Aim for 150 min of moderate intensity exercise weekly for heart health, and weights twice weekly for bone health   Aim for 7-9 hours of sleep daily   When it comes to diets, agreement about the perfect plan isnt easy to find, even among the experts. Experts at the [employer] developed an idea known as the Healthy Eating Plate. Just imagine a plate divided into logical, healthy portions.   The emphasis is on diet quality:   Load up on vegetables and fruits - one-half of your plate: Aim for color and variety, and remember that potatoes dont count.   Go for whole grains - one-quarter of your plate: Whole wheat, barley, wheat berries, quinoa, oats, brown rice, and foods made with them. If you want pasta, go with whole wheat pasta.   Protein power - one-quarter of your plate: Fish, chicken, beans, and nuts are all healthy, versatile protein sources. Limit red meat.   The diet, however, does go beyond the plate, offering a few other suggestions.   Use healthy plant oils, such as olive, canola, soy, corn, sunflower and peanut. Check the labels, and avoid partially hydrogenated oil, which have unhealthy trans fats.   If youre thirsty, drink water. Coffee and tea are good in moderation, but skip sugary drinks and limit milk and dairy products to one or two daily servings.   The type of carbohydrate in the diet is more important than the amount. Some sources of carbohydrates, such as vegetables, fruits, whole grains, and beans-are healthier than others.   Finally, stay active  Signed, Berniece Salines, DO  06/29/2020 [facility]

## 2020-06-29 NOTE — Patient Instructions (Signed)
Medication Instructions:  Your physician has recommended you make the following change in your medication:  START: Lasix 20 mg take one tablet by mouth daily.  START: Potassium 10 meq take one tablet by mouth daily.  *If you need a refill on your cardiac medications before your next appointment, please call your pharmacy*   Lab Work: Your physician recommends that you return for lab work in: [place] If you have labs (blood work) drawn today and your tests are completely normal, you will receive your results only by: Marland Kitchen MyChart Message (if you have MyChart) OR . A paper copy in the mail If you have any lab test that is abnormal or we need to change your treatment, we will call you to review the results.   Testing/Procedures: None   Follow-Up: At [facility], you and your health needs are our priority.  As part of our continuing mission to provide you with exceptional heart care, we have created designated Provider Care Teams.  These Care Teams include your primary Cardiologist (physician) and Advanced Practice Providers (APPs -  Physician Assistants and Nurse Practitioners) who all work together to provide you with the care you need, when you need it.  We recommend signing up for the patient portal called "MyChart".  Sign up information is provided on this After Visit Summary.  MyChart is used to connect with patients for Virtual Visits (Telemedicine).  Patients are able to view lab/test results, encounter notes, upcoming appointments, etc.  Non-urgent messages can be sent to your provider as well.   To learn more about what you can do with MyChart, go to [email or web address].    Your next appointment:   3 month(s)  The format for your next appointment:   In Person  Provider:   Berniece Salines, DO   Other Instructions

## 2020-06-30 ENCOUNTER — Telehealth: Payer: Self-pay

## 2020-06-30 LAB — BASIC METABOLIC PANEL
BUN/Creatinine Ratio: 10 (ref 9–23)
BUN: 7 mg/dL (ref 6–24)
CO2: 25 mmol/L (ref 20–29)
Calcium: 9.1 mg/dL (ref 8.7–10.2)
Chloride: 96 mmol/L (ref 96–106)
Creatinine, Ser: 0.68 mg/dL (ref 0.57–1.00)
Glucose: 164 mg/dL — ABNORMAL HIGH (ref 65–99)
Potassium: 4.3 mmol/L (ref 3.5–5.2)
Sodium: 137 mmol/L (ref 134–144)
eGFR: 108 mL/min/{1.73_m2} (ref >59)

## 2020-06-30 LAB — MAGNESIUM: Magnesium: 1.8 mg/dL (ref 1.6–2.3)

## 2020-06-30 NOTE — Telephone Encounter (Signed)
-----   Message from Richardo Priest, MD sent at 06/30/2020  7:52 AM EDT ----- Stable results no changes

## 2020-06-30 NOTE — Telephone Encounter (Signed)
Left message on patients voicemail to please return our call.   

## 2020-06-30 NOTE — Telephone Encounter (Signed)
Spoke with patient regarding results and recommendation.  Patient verbalizes understanding and is agreeable to plan of care. Advised patient to call back with any issues or concerns.  

## 2020-06-30 NOTE — Telephone Encounter (Signed)
Patient is returning call to discuss results. 

## 2020-09-17 ENCOUNTER — Telehealth: Payer: Self-pay | Admitting: Cardiology

## 2020-09-17 MED ORDER — NITROGLYCERIN 0.4 MG SL SUBL: 0.4000 mg | SUBLINGUAL_TABLET | SUBLINGUAL | 3 refills | Status: AC | PRN

## 2020-09-17 NOTE — Telephone Encounter (Signed)
Pt c/o of Chest Pain: STAT if CP now or developed within 24 hours  1. Are you having CP right now? Yes   2. Are you experiencing any other symptoms (ex. SOB, nausea, vomiting, sweating)? nausated  3. How long have you been experiencing CP? Yesterday   4. Is your CP continuous or coming and going? Coming and going  5. Have you taken Nitroglycerin? Prescription is expired.    *STAT* If patient is at the pharmacy, call can be transferred to refill team.   1. Which medications need to be refilled? (please list name of each medication and dose if known) nitroGLYCERIN (NITROSTAT) 0.4 MG SL tablet  2. Which pharmacy/location (including street and city if local pharmacy) is medication to be sent to?  [facility] - [place], [address]  3. Do they need a 30 day or 90 day supply? 30  ?

## 2020-09-17 NOTE — Telephone Encounter (Signed)
Pt states that she has been inhaling fumes while someone was working on the bathroom and began having a headache, nausea ans rt side chest pain. Pt states her BP has been elevated but better today. Advised to call 911 and go tot the ED as needed for chest pain, radiating pain, n/v, diaphoresis and shortness of breath. Pt verbalized understanding and had no additional questions. Pt advised to keep a bp log this weekend 1-2 hours after her medications and let us know how it is running so we can make adjustments as needed. NTG RX sent as pt was expired.

## 2020-09-29 ENCOUNTER — Ambulatory Visit: Payer: Medicare Other | Admitting: Cardiology

## 2020-10-08 ENCOUNTER — Ambulatory Visit: Payer: Medicare Other | Admitting: Cardiology

## 2020-11-15 ENCOUNTER — Ambulatory Visit: Payer: Medicare Other | Admitting: Cardiology

## 2020-12-06 ENCOUNTER — Ambulatory Visit: Payer: Medicare Other | Admitting: Cardiology

## 2020-12-08 ENCOUNTER — Ambulatory Visit: Payer: Medicare Other | Admitting: Cardiology

## 2021-01-03 ENCOUNTER — Ambulatory Visit: Payer: Medicare Other | Admitting: Cardiology

## 2021-02-10 ENCOUNTER — Other Ambulatory Visit: Payer: Self-pay | Admitting: Obstetrics and Gynecology

## 2021-02-10 DIAGNOSIS — N6452 Nipple discharge: Secondary | ICD-10-CM

## 2021-02-22 ENCOUNTER — Other Ambulatory Visit: Payer: Self-pay

## 2021-02-22 ENCOUNTER — Ambulatory Visit
Admission: RE | Admit: 2021-02-22 | Discharge: 2021-02-22 | Disposition: A | Discharge status: Home / Self Care | Payer: Medicare Other | Source: Ambulatory Visit | Attending: Obstetrics and Gynecology | Admitting: Obstetrics and Gynecology

## 2021-02-22 DIAGNOSIS — N6452 Nipple discharge: Secondary | ICD-10-CM

## 2021-02-22 MED ORDER — GADOBUTROL 1 MMOL/ML IV SOLN
10.0000 mL | Freq: Once | INTRAVENOUS | Status: AC | PRN
  Administered 2021-02-22: 10 mL via INTRAVENOUS

## 2021-03-30 ENCOUNTER — Telehealth: Payer: Self-pay | Admitting: *Deleted

## 2021-03-30 NOTE — Telephone Encounter (Signed)
Pt has been scheduled for telephone visit, 03/31/21, 1:20. ?Consent / medication reconciliation as been completed. ? ?  ?Patient Consent for Virtual Visit  ? ? ?   ? ?Meredith Cherry has provided verbal consent on 03/30/2021 for a virtual visit (video or telephone). ? ? ?CONSENT FOR VIRTUAL VISIT FOR:  Meredith Cherry  ?By participating in this virtual visit I agree to the following: ? ?I hereby voluntarily request, consent and authorize [facility] and its employed or contracted physicians, physician assistants, nurse practitioners or other licensed health care professionals (the Practitioner), to provide me with telemedicine health care services (the ?Services") as deemed necessary by the treating Practitioner. I acknowledge and consent to receive the Services by the Practitioner via telemedicine. I understand that the telemedicine visit will involve communicating with the Practitioner through live audiovisual communication technology and the disclosure of certain medical information by electronic transmission. I acknowledge that I have been given the opportunity to request an in-person assessment or other available alternative prior to the telemedicine visit and am voluntarily participating in the telemedicine visit. ? ?I understand that I have the right to withhold or withdraw my consent to the use of telemedicine in the course of my care at any time, without affecting my right to future care or treatment, and that the Practitioner or I may terminate the telemedicine visit at any time. I understand that I have the right to inspect all information obtained and/or recorded in the course of the telemedicine visit and may receive copies of available information for a reasonable fee.  I understand that some of the potential risks of receiving the Services via telemedicine include:  ?Delay or interruption in medical evaluation due to technological equipment failure or disruption; ?Information transmitted may not be  sufficient (e.g. poor resolution of images) to allow for appropriate medical decision making by the Practitioner; and/or  ?In rare instances, security protocols could fail, causing a breach of personal health information. ? ?Furthermore, I acknowledge that it is my responsibility to provide information about my medical history, conditions and care that is complete and accurate to the best of my ability. I acknowledge that Practitioner's advice, recommendations, and/or decision may be based on factors not within their control, such as incomplete or inaccurate data provided by me or distortions of diagnostic images or specimens that may result from electronic transmissions. I understand that the practice of medicine is not an exact science and that Practitioner makes no warranties or guarantees regarding treatment outcomes. I acknowledge that a copy of this consent can be made available to me via my patient portal ([facility]), or I can request a printed copy by calling the office of [facility].   ? ?I understand that my insurance will be billed for this visit.  ? ?I have read or had this consent read to me. ?I understand the contents of this consent, which adequately explains the benefits and risks of the Services being provided via telemedicine.  ?I have been provided ample opportunity to ask questions regarding this consent and the Services and have had my questions answered to my satisfaction. ?I give my informed consent for the services to be provided through the use of telemedicine in my medical care ? ? ? ?

## 2021-03-30 NOTE — Telephone Encounter (Signed)
Primary Cardiologist:Kardie Tobb, DO ? ?Chart reviewed as part of pre-operative protocol coverage. Because of Jeny B Dascoli's past medical history and time since last visit, he/she will require a virtual visit/telephone call in order to better assess preoperative cardiovascular risk. ? ?Pre-op covering staff: ?- Please contact patient, obtain consent, and schedule appointment  ? ?If applicable, this message will also be routed to pharmacy pool and/or primary cardiologist for input on holding anticoagulant/antiplatelet agent as requested below so that this information is available at time of patient's appointment.  ? ?Emmaline Life, NP-C ? ?  ?03/30/2021, 3:05 PM ?[place] ?[address], [address] ?Office [phone] Fax [phone] ? ?

## 2021-03-30 NOTE — Telephone Encounter (Signed)
? ?  Pre-operative Risk Assessment  ?  ?Patient Name: Meredith Cherry  ?DOB: 1970/07/29 ?MRN: [id number]  ? ?  ? ?Request for Surgical Clearance   ? ?Procedure:   Bilateral breast central duct excision ? ?Date of Surgery:  Clearance TBD                              ?   ?Surgeon:  Dr Peggyann Juba  ?Surgeon's Group or Practice Name:   [facility] ?Phone number:  [phone] ?Fax number:  [phone] ?  ?Type of Clearance Requested:   ?- Medical  ?  ?Type of Anesthesia:  General  ?  ?Additional requests/questions:  Please advise surgeon/provider what medications should be held. ? ?Signed, ?Trixie Dredge V   ?03/30/2021, 2:50 PM   ?

## 2021-03-30 NOTE — Telephone Encounter (Signed)
Pt has been scheduled for a telephone visit, 03/31/21 at 1:20. ?

## 2021-03-31 ENCOUNTER — Other Ambulatory Visit: Payer: Self-pay

## 2021-03-31 ENCOUNTER — Ambulatory Visit (INDEPENDENT_AMBULATORY_CARE_PROVIDER_SITE_OTHER): Payer: Medicare Other | Admitting: General Practice

## 2021-03-31 DIAGNOSIS — Z01810 Encounter for preprocedural cardiovascular examination: Secondary | ICD-10-CM

## 2021-03-31 NOTE — Progress Notes (Signed)
Virtual Visit via Telephone Note   This visit type was conducted due to national recommendations for restrictions regarding the COVID-19 Pandemic (e.g. social distancing) in an effort to limit this patient's exposure and mitigate transmission in our community.  Due to her co-morbid illnesses, this patient is at least at moderate risk for complications without adequate follow up.  This format is felt to be most appropriate for this patient at this time.  The patient did not have access to video technology/had technical difficulties with video requiring transitioning to audio format only (telephone).  All issues noted in this document were discussed and addressed.  No physical exam could be performed with this format.  Please refer to the patient's chart for her  consent to telehealth for [facility]. Evaluation Performed:  Preoperative cardiovascular risk assessment  This visit type was conducted due to national recommendations for restrictions regarding the COVID-19 Pandemic (e.g. social distancing).  This format is felt to be most appropriate for this patient at this time.  All issues noted in this document were discussed and addressed.  No physical exam was performed (except for noted visual exam findings with Video Visits).  Please refer to the patient's chart (MyChart message for video visits and phone note for telephone visits) for the patient's consent to telehealth for [facility]. _____________   Date:  03/31/2021   Patient ID:  Meredith Cherry, DOB 06-02-1970, MRN [id number] Patient Location:  Home Provider location:   Office  Primary Care Provider:  Myrlene Broker, MD Primary Cardiologist:  Berniece Salines, DO  Chief Complaint    51 y.o. y/o female with a h/o type 2 diabetes, hyperlipidemia, GERD, hypertension, depression, and anxiety, who is pending for preoperative cardiac evaluation for bilateral breast central duct excision, and presents today for telephonic preoperative  cardiovascular risk assessment.    Past Medical History    Past Medical History:  Diagnosis Date   Acute encephalopathy    Acute generalized abdominal pain 04/02/2015   Acute recurrent pansinusitis 11/24/2016   ADHD (attention deficit hyperactivity disorder)    Adjustment disorder with mixed anxiety and depressed mood 06/01/2016   Allergic rhinitis    Anxiety    Arrhythmia 09/03/2012   Asthma    Bipolar 1 disorder ([place])    Bipolar affect, depressed ([place]) 02/19/2017   Borderline personality disorder ([place]) 02/27/2017   BRBPR (bright red blood per rectum) 12/12/2017   Bronchitis 09/03/2012   Chest pain at rest 07/02/2015   Chest wall pain 05/22/2018   Chiari malformation type I ([place])    Chronic migraine    Colitis 06/14/2018   Conversion disorder with abnormal movement 08/23/2015   Decreased activities of daily living (ADL) 06/16/2019   Depression    Diabetes ([place]) 02/26/2017   Diabetes mellitus without complication ([place])    Drug-induced hypotension 05/17/2017   Essential hypertension 11/22/2012   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 Overview:  H/o cutaneous skin infection   History of MRSA infection 11/22/2012   Formatting of this note might be different from the original. 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   Migraine 02/26/2017   Migraine without aura 09/03/2012   Orthostatic hypotension 02/12/2017   Pain in pelvis 06/16/2019   Panic attack    Pharyngitis, chronic 07/29/2018   Prolonged QT interval 11/15/2015   Pseudoseizure 09/14/2015   Pseudoseizures ([place]) 11/14/2015   Psychiatric disorder    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 Had retention of 400 cc, 999 cc, 900 cc.   Past Surgical History:  Procedure Laterality Date   CHOLECYSTECTOMY     TUBAL LIGATION      Allergies  Allergies  Allergen Reactions   Ciprofloxacin Rash   Penicillin V Rash   Penicillins Other (See Comments)    Unknown   Sulfa Antibiotics Rash   Sulfamethoxazole-Trimethoprim Other (See Comments) and Hives    Unknown    History of Present Illness    Meredith Cherry is a 51 y.o. female who presents via audio/video conferencing for a telehealth visit today.  Pt was last seen in cardiology clinic on 06/29/2020, by Dr. Harriet Masson.  At that time Meredith Cherry was doing well .  she is now pending bilateral breast central duct excision.  Since his last visit, she remained stable from a cardiac standpoint.  Today she denies chest pain, shortness of breath, lower extremity edema, fatigue, palpitations, melena, hematuria, hemoptysis, diaphoresis, weakness, presyncope, syncope, orthopnea, and PND.   Home Medications    Prior to Admission medications   Medication Sig Start Date End Date Taking? Authorizing Provider  albuterol (VENTOLIN HFA) 108 (90  Base) MCG/ACT inhaler Inhale 2 puffs into the lungs every 4 (four) hours. 04/04/19   [provider]  carisoprodol (SOMA) 350 MG tablet Take 350 mg by mouth 3 (three) times daily as needed. 10/14/19   [provider]  cyclobenzaprine (FLEXERIL) 10 MG tablet Take 1 tablet by mouth 3 (three) times daily as needed. For muscle spasms 11/28/19   [provider]  divalproex (DEPAKOTE) 500 MG DR tablet Take 500 mg by mouth 2 (two) times daily.    [provider]  EPINEPHrine 0.15 MG/0.15ML IJ injection Inject 0.15 mg into the muscle as needed for anaphylaxis.     [provider]  EPINEPHrine 0.3 mg/0.3 mL IJ SOAJ injection Inject 0.3 mg into the muscle as directed. Inject 0.3 mg into muscle as needed for anaphylaxis. 09/16/19   [provider]  ferrous sulfate 325 (65 FE) MG tablet Take 325 mg by mouth daily. 08/29/19 03/30/21  [provider]  furosemide (LASIX) 20 MG tablet Take 1 tablet (20 mg total) by mouth daily. 06/29/20 03/30/21  Tobb, Kardie, DO  gabapentin (NEURONTIN) 800 MG tablet Take 800 mg by mouth 3 (three) times daily.    [provider]  insulin aspart (NOVOLOG) 100 UNIT/ML injection Inject into the skin. Inject into the skin daily as needed for High blood sugar.  [provider]  insulin glargine, 2 Unit Dial, (TOUJEO MAX SOLOSTAR) 300 UNIT/ML Solostar Pen Inject 20 Units into the skin daily.    [provider]  ketoconazole (NIZORAL) 2 % cream SMARTSIG:1 Application Topical 1 to 2 Times Daily 11/14/19   [provider]  Magnesium 500 MG CAPS Take 1,000 mg by mouth daily.    [provider]  Melatonin 10 MG TABS Take 20 mg by mouth daily.    [provider]  metoprolol tartrate (LOPRESSOR) 50 MG tablet Take 1 tablet (50 mg total) by mouth 2 (two) times daily. 03/02/17   McNew, Tyson Babinski, MD  nitroGLYCERIN (NITROSTAT) 0.4 MG SL tablet Place 1 tablet (0.4 mg total) under the tongue as  needed. Every 5 minutes as needed for chest pain 09/17/20   Tobb, Kardie, DO  pantoprazole (PROTONIX) 40 MG tablet Take 40 mg by mouth 2 (two) times daily. 05/26/19   [provider]  pravastatin (PRAVACHOL) 40 MG tablet Take 40 mg by mouth daily. 08/24/19   [provider]  promethazine (PHENERGAN) 25 MG tablet Take 0.5-1 tab PO Q6HR PRN Nausea, Vomting 05/26/19   [provider]  QUEtiapine (SEROQUEL) 300 MG tablet Take by mouth. 06/10/19   [provider]  Semaglutide,0.25 or 0.'5MG'$ /DOS, (OZEMPIC, 0.25 OR 0.5 MG/DOSE,) 2 MG/1.5ML SOPN Inject into the skin. 12/18/18   [provider]  sertraline (ZOLOFT) 100 MG tablet Take 100 mg by mouth daily. 06/10/19   [provider]  telmisartan (MICARDIS) 20 MG tablet Take 20 mg by mouth daily. 05/26/19   [provider]  Donnal Debar 100-62.5-25 MCG/INH AEPB SMARTSIG:1 Inhalation Via Inhaler Daily 07/24/19   [provider]    Physical Exam    Vital Signs:  GRACELYNNE BENEDICT does not have vital signs available for review today.  Given telephonic nature of communication, physical exam is limited. AAOx3. NAD. Normal affect.  Speech and respirations are unlabored.  Accessory Clinical Findings    None  Assessment & Plan    1.  Preoperative Cardiovascular Risk Assessment:     Primary Cardiologist: Berniece Salines, DO  Chart reviewed as part of pre-operative protocol coverage. Given past medical history and time since last visit, based on ACC/AHA guidelines, LAM BJORKLUND would be at acceptable risk for the planned procedure without further cardiovascular testing.   Her RCRI is a class II risk, 0.9% risk of major cardiac event.  She is able to complete greater than 4 METS of physical activity.  Patient was advised that if  she develops new symptoms prior to surgery to contact our office to arrange a follow-up appointment.  She verbalized understanding.      COVID-19 Education: The signs  and symptoms of COVID-19 were discussed with the patient and how to seek care for testing (follow up with PCP or arrange E-visit).  The importance of social distancing was discussed today.  Patient Risk:   After full review of this patient's history and clinical status, I feel that he is at least moderate risk for cardiac complications at this time, thus necessitating a telehealth visit sooner than our first available in office visit.  Time:   Today, I have spent 5 minutes with the patient with telehealth technology discussing medical history, symptoms, and management plan.  I spent greater than 10 minutes reviewing patient's past medical history and review of chart prior to call.   Deberah Pelton, NP  03/31/2021, 1:08 PM

## 2021-04-06 ENCOUNTER — Encounter: Payer: Self-pay | Admitting: Cardiology

## 2021-04-06 DIAGNOSIS — N6452 Nipple discharge: Secondary | ICD-10-CM | POA: Diagnosis not present

## 2021-05-30 ENCOUNTER — Ambulatory Visit: Payer: Medicare Other | Admitting: Cardiology

## 2021-05-30 ENCOUNTER — Encounter: Payer: Self-pay | Admitting: Cardiology

## 2021-05-30 VITALS — BP 180/110 | HR 117 | Ht 66.0 in | Wt 224.2 lb

## 2021-05-30 DIAGNOSIS — Z79899 Other long term (current) drug therapy: Secondary | ICD-10-CM | POA: Diagnosis not present

## 2021-05-30 DIAGNOSIS — I471 Supraventricular tachycardia: Secondary | ICD-10-CM

## 2021-05-30 DIAGNOSIS — I10 Essential (primary) hypertension: Secondary | ICD-10-CM | POA: Diagnosis not present

## 2021-05-30 DIAGNOSIS — E119 Type 2 diabetes mellitus without complications: Secondary | ICD-10-CM

## 2021-05-30 MED ORDER — CARVEDILOL 12.5 MG PO TABS
12.5000 mg | ORAL_TABLET | Freq: Two times a day (BID) | ORAL | 3 refills | Status: DC
Start: 2021-05-30 — End: 2022-04-18

## 2021-05-30 NOTE — Patient Instructions (Addendum)
Medication Instructions:  ?Your physician has recommended you make the following change in your medication:  ?STOP: Metoprolol  ?START: Coreg 12.5 mg twice daily ? ?Please take your blood pressure daily for 2 weeks and send in a MyChart message. Please include heart rates.  ? ?HOW TO TAKE YOUR BLOOD PRESSURE: ?Rest 5 minutes before taking your blood pressure. ?Don?t smoke or drink caffeinated beverages for at least 30 minutes before. ?Take your blood pressure before (not after) you eat. ?Sit comfortably with your back supported and both feet on the floor (don?t cross your legs). ?Elevate your arm to heart level on a table or a desk. ?Use the proper sized cuff. It should fit smoothly and snugly around your bare upper arm. There should be enough room to slip a fingertip under the cuff. The bottom edge of the cuff should be 1 inch above the crease of the elbow. ?Ideally, take 3 measurements at one sitting and record the average. ? ?*If you need a refill on your cardiac medications before your next appointment, please call your pharmacy* ? ? ?Lab Work: ?Your physician recommends that you return for lab work in:  ?TODAY: BMET, [place], CBC ?If you have labs (blood work) drawn today and your tests are completely normal, you will receive your results only by: ?MyChart Message (if you have MyChart) OR ?A paper copy in the mail ?If you have any lab test that is abnormal or we need to change your treatment, we will call you to review the results. ? ? ?Testing/Procedures: ?None ? ? ?Follow-Up: ?At [facility], you and your health needs are our priority.  As part of our continuing mission to provide you with exceptional heart care, we have created designated Provider Care Teams.  These Care Teams include your primary Cardiologist (physician) and Advanced Practice Providers (APPs -  Physician Assistants and Nurse Practitioners) who all work together to provide you with the care you need, when you need it. ? ?We recommend signing  up for the patient portal called "MyChart".  Sign up information is provided on this After Visit Summary.  MyChart is used to connect with patients for Virtual Visits (Telemedicine).  Patients are able to view lab/test results, encounter notes, upcoming appointments, etc.  Non-urgent messages can be sent to your provider as well.   ?To learn more about what you can do with MyChart, go to [email or web address].   ? ?Your next appointment:   ?4 month(s) ? ?The format for your next appointment:   ?In Person ? ?Provider:   ?Berniece Salines, DO   ? ? ?Other Instructions ? ? ?Important Information About Sugar ? ? ? ? ?  ?

## 2021-05-30 NOTE — Progress Notes (Signed)
?Cardiology Office Note:   ? ?Date:  05/30/2021  ? ?ID:  Meredith Cherry, DOB [birth year], MRN [id number] ? ?PCP:  Myrlene Broker, MD  ?Cardiologist:  Berniece Salines, DO  ?Electrophysiologist:  None  ? ?Referring MD: Myrlene Broker, MD  ? ?" I am ok" ? ?History of Present Illness:   ? ?Meredith Cherry is a 51 y.o. female with a hx of insulin-dependent diabetes type 2, hypertension, hyperlipidemia, GERD, depression, bipolar disorder with anxiety. ? ?I last saw the patient on June 29, 2020 at that time she was status post hospitalization at [facility] where she had a nuclear stress test which was normal.  During that visit he had leg edema I restarted her Lasix 20 mg daily.  Her blood pressure was acceptable we continued her statin for hyperlipidemia. ? ?She is here today for follow-up visit.  She tells me over the last few days she has been experiencing elevated blood pressure.  Her PCP had optimize her antihypertensive medication adding hydralazine 50 mg every 8 hours but this does not seem to be helping. ? ?Past Medical History:  ?Diagnosis Date  ? Acute encephalopathy   ? Acute generalized abdominal pain 04/02/2015  ? Acute recurrent pansinusitis 11/24/2016  ? ADHD (attention deficit hyperactivity disorder)   ? Adjustment disorder with mixed anxiety and depressed mood 06/01/2016  ? Allergic rhinitis   ? Anxiety   ? Arrhythmia 09/03/2012  ? Asthma   ? Bipolar 1 disorder ([place])   ? Bipolar affect, depressed ([place]) 02/19/2017  ? Borderline personality disorder ([place]) 02/27/2017  ? BRBPR (bright red blood per rectum) 12/12/2017  ? Bronchitis 09/03/2012  ? Chest pain at rest 07/02/2015  ? Chest wall pain 05/22/2018  ? Chiari malformation type I ([place])   ? Chronic migraine   ? Colitis 06/14/2018  ? Conversion disorder with abnormal movement 08/23/2015  ? Decreased activities of daily living (ADL) 06/16/2019  ? Depression   ? Diabetes ([place]) 02/26/2017  ? Diabetes mellitus without complication ([place])   ? Drug-induced hypotension 05/17/2017  ?  Essential hypertension 11/22/2012  ? 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 Gastro-esophageal reflux disease without esophagitis 11/22/2012  ? GERD (gastroesophageal reflux disease)   ? H/O chest pain 04/02/2015  ? Headache, unspecified 09/16/2012  ? High risk medication use 09/07/2016  ? History of Chiari malformation 11/15/2015  ? History of infectious disease 11/22/2012  ? Formatting of this note might be different from the original. Overview:  H/o cutaneous skin infection  ? History of MRSA infection 11/22/2012  ? Formatting of this note might be different from the original. H/o cutaneous skin infection  ? Homicidal ideation 08/23/2016  ? Hospital discharge follow-up 11/12/2017  ? Hypertension   ? Hypertensive disorder 06/16/2019  ? Hypertensive urgency 04/02/2015  ? Hypokalemia 06/14/2018  ? Hyponatremia 06/14/2018  ? Hypotension due to hypovolemia 12/12/2016  ? Lumbar spondylosis with myelopathy 09/03/2012  ? Major depressive disorder, recurrent severe without psychotic features ([place]) 02/26/2017  ? Migraine 02/26/2017  ? Migraine without aura 09/03/2012  ? Orthostatic hypotension 02/12/2017  ? Pain in pelvis 06/16/2019  ? Panic attack   ? Pharyngitis, chronic 07/29/2018  ? Prolonged QT interval 11/15/2015  ? Pseudoseizure 09/14/2015  ? Pseudoseizures 11/14/2015  ? Psychiatric disorder   ? 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 PTSD (post-traumatic stress disorder) 09/14/2015  ? Right leg weakness 04/02/2015  ? Seborrheic dermatitis 07/31/2011  ? Snoring 12/07/2011  ? Subacute frontal sinusitis 09/07/2016  ? Supraventricular tachycardia ([place]) 04/04/2017  ?  Syncope and collapse 11/22/2012  ? Type 2 or unspecified type diabetes mellitus 06/08/2015  ? Uncontrolled hypertension 02/22/2017  ? Urinary retention 04/02/2015  ? Formatting of this note might be different from the original. Had retention of 400 cc, 999 cc, 900 cc.  ? ? ?Past Surgical History:  ?Procedure Laterality Date  ? CHOLECYSTECTOMY    ? TUBAL LIGATION    ? ? ?Current  Medications: ?Current Meds  ?Medication Sig  ? albuterol (VENTOLIN HFA) 108 (90 Base) MCG/ACT inhaler Inhale 2 puffs into the lungs every 4 (four) hours.  ? azithromycin (ZITHROMAX) 500 MG tablet Take 500 mg by mouth daily.  ? carvedilol (COREG) 12.5 MG tablet Take 1 tablet (12.5 mg total) by mouth 2 (two) times daily.  ? cyclobenzaprine (FLEXERIL) 10 MG tablet Take 1 tablet by mouth 3 (three) times daily as needed. For muscle spasms  ? divalproex (DEPAKOTE) 500 MG DR tablet Take 500 mg by mouth 2 (two) times daily.  ? EPINEPHrine 0.15 MG/0.15ML IJ injection Inject 0.15 mg into the muscle as needed for anaphylaxis.   ? EPINEPHrine 0.3 mg/0.3 mL IJ SOAJ injection Inject 0.3 mg into the muscle as directed. Inject 0.3 mg into muscle as needed for anaphylaxis.  ? ferrous sulfate 325 (65 FE) MG tablet Take 325 mg by mouth daily.  ? furosemide (LASIX) 20 MG tablet Take 1 tablet (20 mg total) by mouth daily.  ? gabapentin (NEURONTIN) 800 MG tablet Take 800 mg by mouth 3 (three) times daily.  ? hydrALAZINE (APRESOLINE) 50 MG tablet Take 50 mg by mouth 3 (three) times daily.  ? HYDROcodone-acetaminophen (NORCO/VICODIN) 5-325 MG tablet Take 1 tablet by mouth every 6 (six) hours as needed.  ? insulin aspart (NOVOLOG) 100 UNIT/ML injection Inject into the skin. Inject into the skin daily as needed for High blood sugar.  ? insulin glargine, 2 Unit Dial, (TOUJEO MAX SOLOSTAR) 300 UNIT/ML Solostar Pen Inject 20 Units into the skin daily.  ? Magnesium 500 MG CAPS Take 1,000 mg by mouth daily.  ? Melatonin 10 MG TABS Take 20 mg by mouth daily.  ? montelukast (SINGULAIR) 10 MG tablet Take 10 mg by mouth daily.  ? nitroGLYCERIN (NITROSTAT) 0.4 MG SL tablet Place 1 tablet (0.4 mg total) under the tongue as needed. Every 5 minutes as needed for chest pain  ? ondansetron (ZOFRAN) 4 MG tablet Take 4 mg by mouth every 8 (eight) hours as needed.  ? oxyCODONE (OXY IR/ROXICODONE) 5 MG immediate release tablet Take 5 mg by mouth every 6 (six)  hours.  ? pantoprazole (PROTONIX) 40 MG tablet Take 40 mg by mouth 2 (two) times daily.  ? pravastatin (PRAVACHOL) 40 MG tablet Take 40 mg by mouth daily.  ? promethazine (PHENERGAN) 25 MG tablet Take 0.5-1 tab PO Q6HR PRN Nausea, Vomting  ? QUEtiapine (SEROQUEL) 300 MG tablet Take by mouth.  ? Semaglutide,0.25 or 0.'5MG'$ /DOS, (OZEMPIC, 0.25 OR 0.5 MG/DOSE,) 2 MG/1.5ML SOPN Inject into the skin.  ? sertraline (ZOLOFT) 100 MG tablet Take 100 mg by mouth daily.  ? spironolactone (ALDACTONE) 25 MG tablet Take 25 mg by mouth daily.  ? telmisartan (MICARDIS) 20 MG tablet Take 20 mg by mouth daily.  ? TRELEGY ELLIPTA 100-62.5-25 MCG/INH AEPB SMARTSIG:1 Inhalation Via Inhaler Daily  ? [DISCONTINUED] metoprolol tartrate (LOPRESSOR) 100 MG tablet Take 100 mg by mouth 2 (two) times daily.  ?  ? ?Allergies:   Ciprofloxacin, Penicillin v, Penicillins, Sulfa antibiotics, and Sulfamethoxazole-trimethoprim  ? ?Social History  ? ?Socioeconomic  History  ? Marital status: Married  ?  Spouse name: Not on file  ? Number of children: 2  ? Years of education: Not on file  ? Highest education level: Not on file  ?Occupational History  ? Occupation: unemployed  ?Tobacco Use  ? Smoking status: Never  ? Smokeless tobacco: Never  ?Substance and Sexual Activity  ? Alcohol use: No  ? Drug use: No  ? Sexual activity: Not on file  ?Other Topics Concern  ? Not on file  ?Social History Narrative  ? Not on file  ? ?Social Determinants of Health  ? ?Financial Resource Strain: Not on file  ?Food Insecurity: Not on file  ?Transportation Needs: Not on file  ?Physical Activity: Not on file  ?Stress: Not on file  ?Social Connections: Not on file  ?  ? ?Family History: ?The patient's family history includes Asthma in her brother; Breast cancer in her paternal grandmother; Diabetes in her father and mother; Heart disease in her maternal grandmother and mother; Hypertension in her mother; Stroke in her mother. ? ?ROS:   ?Review of Systems  ?Constitution:  Negative for decreased appetite, fever and weight gain.  ?HENT: Negative for congestion, ear discharge, hoarse voice and sore throat.   ?Eyes: Negative for discharge, redness, vision loss in right eye and visual hal

## 2021-05-31 LAB — CBC WITH DIFFERENTIAL/PLATELET
Basophils Absolute: 0.1 10*3/uL (ref 0.0–0.2)
Basos: 1 %
EOS (ABSOLUTE): 0.1 10*3/uL (ref 0.0–0.4)
Eos: 1 %
Hematocrit: 40 % (ref 34.0–46.6)
Hemoglobin: 13.5 g/dL (ref 11.1–15.9)
Immature Grans (Abs): 0.2 10*3/uL — ABNORMAL HIGH (ref 0.0–0.1)
Immature Granulocytes: 2 %
Lymphocytes Absolute: 2.3 10*3/uL (ref 0.7–3.1)
Lymphs: 31 %
MCH: 31.6 pg (ref 26.6–33.0)
MCHC: 33.8 g/dL (ref 31.5–35.7)
MCV: 94 fL (ref 79–97)
Monocytes Absolute: 0.6 10*3/uL (ref 0.1–0.9)
Monocytes: 8 %
Neutrophils Absolute: 4.4 10*3/uL (ref 1.4–7.0)
Neutrophils: 57 %
Platelets: 395 10*3/uL (ref 150–450)
RBC: 4.27 x10E6/uL (ref 3.77–5.28)
RDW: 13.3 % (ref 11.7–15.4)
WBC: 7.6 10*3/uL (ref 3.4–10.8)

## 2021-05-31 LAB — BASIC METABOLIC PANEL
BUN/Creatinine Ratio: 12 (ref 9–23)
BUN: 8 mg/dL (ref 6–24)
CO2: 23 mmol/L (ref 20–29)
Calcium: 9.8 mg/dL (ref 8.7–10.2)
Chloride: 95 mmol/L — ABNORMAL LOW (ref 96–106)
Creatinine, Ser: 0.66 mg/dL (ref 0.57–1.00)
Glucose: 125 mg/dL — ABNORMAL HIGH (ref 70–99)
Potassium: 4.3 mmol/L (ref 3.5–5.2)
Sodium: 139 mmol/L (ref 134–144)
eGFR: 108 mL/min/{1.73_m2} (ref >59)

## 2021-05-31 LAB — MAGNESIUM: Magnesium: 1.7 mg/dL (ref 1.6–2.3)

## 2021-06-12 ENCOUNTER — Encounter: Payer: Self-pay | Admitting: Cardiology

## 2021-10-24 ENCOUNTER — Ambulatory Visit: Payer: Medicare Other | Admitting: Cardiology

## 2021-10-25 ENCOUNTER — Encounter: Payer: Self-pay | Admitting: Cardiology

## 2021-10-25 ENCOUNTER — Ambulatory Visit: Payer: Medicare Other | Attending: Cardiology | Admitting: Cardiology

## 2021-10-25 VITALS — BP 138/80 | HR 90 | Ht 66.0 in | Wt 233.8 lb

## 2021-10-25 DIAGNOSIS — E1142 Type 2 diabetes mellitus with diabetic polyneuropathy: Secondary | ICD-10-CM | POA: Diagnosis not present

## 2021-10-25 DIAGNOSIS — I471 Supraventricular tachycardia, unspecified: Secondary | ICD-10-CM | POA: Diagnosis not present

## 2021-10-25 DIAGNOSIS — I10 Essential (primary) hypertension: Secondary | ICD-10-CM

## 2021-10-25 DIAGNOSIS — Z79899 Other long term (current) drug therapy: Secondary | ICD-10-CM

## 2021-10-25 DIAGNOSIS — E669 Obesity, unspecified: Secondary | ICD-10-CM | POA: Diagnosis not present

## 2021-10-25 MED ORDER — FUROSEMIDE 40 MG PO TABS: 40.0000 mg | ORAL_TABLET | Freq: Every day | ORAL | 3 refills | Status: DC

## 2021-10-25 NOTE — Progress Notes (Signed)
Cardiology Office Note:    Date:  10/28/2021   ID:  Meredith Cherry, DOB [birth year], MRN [id number]  PCP:  Myrlene Broker, MD  Cardiologist:  Berniece Salines, DO  Electrophysiologist:  None   Referring MD: Myrlene Broker, MD   " I am ok"  History of Present Illness:    Meredith Cherry is a 51 y.o. female with a hx of insulin-dependent diabetes type 2, hypertension, hyperlipidemia, GERD, depression, bipolar disorder with anxiety.  I last saw the patient on June 29, 2020 at that time she was status post hospitalization at [facility] where she had a nuclear stress test which was normal.  During that visit he had leg edema I restarted her Lasix 20 mg daily.  Her blood pressure was acceptable we continued her statin for hyperlipidemia.  At her visit in May 2023 at that time she was hypertensive I stopped her metoprolol and started the patient on carvedilol.  She is here today for follow-up visit. She tells me that her leg swelling is getting slightly worse.  No other complaints at this time.  Past Medical History:  Diagnosis Date   Acute encephalopathy    Acute generalized abdominal pain 04/02/2015   Acute recurrent pansinusitis 11/24/2016   ADHD (attention deficit hyperactivity disorder)    Adjustment disorder with mixed anxiety and depressed mood 06/01/2016   Allergic rhinitis    Anxiety    Arrhythmia 09/03/2012   Asthma    Bipolar 1 disorder ([place])    Bipolar affect, depressed ([place]) 02/19/2017   Borderline personality disorder ([place]) 02/27/2017   BRBPR (bright red blood per rectum) 12/12/2017   Bronchitis 09/03/2012   Chest pain at rest 07/02/2015   Chest wall pain 05/22/2018   Chiari malformation type I ([place])    Chronic migraine    Colitis 06/14/2018   Conversion disorder with abnormal movement 08/23/2015   Decreased activities of daily living (ADL) 06/16/2019   Depression    Diabetes ([place]) 02/26/2017   Diabetes mellitus without complication ([place])    Drug-induced hypotension  05/17/2017   Essential hypertension 11/22/2012   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 Overview:  H/o cutaneous skin infection   History of MRSA infection 11/22/2012   Formatting of this note might be different from the original. 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   Migraine 02/26/2017   Migraine without aura 09/03/2012   Orthostatic hypotension 02/12/2017   Pain in pelvis 06/16/2019   Panic attack    Pharyngitis, chronic 07/29/2018   Prolonged QT interval 11/15/2015   Pseudoseizure 09/14/2015   Pseudoseizures 11/14/2015   Psychiatric disorder    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 Had retention of 400 cc, 999 cc, 900 cc.    Past Surgical History:  Procedure Laterality Date   CHOLECYSTECTOMY     TUBAL LIGATION      Current  Medications: Current Meds  Medication Sig   albuterol (VENTOLIN HFA) 108 (90 Base) MCG/ACT inhaler Inhale 2 puffs into the lungs every 4 (four) hours.   azithromycin (ZITHROMAX) 500 MG tablet Take 500 mg by mouth daily.   carvedilol (COREG) 12.5 MG tablet Take 1 tablet (12.5 mg total) by mouth 2 (two) times daily.   cyclobenzaprine (FLEXERIL) 10 MG tablet Take 1 tablet by mouth 3 (three) times daily as needed. For muscle spasms   divalproex (DEPAKOTE) 500 MG DR tablet Take 500 mg by mouth 2 (two) times daily.   EPINEPHrine 0.15 MG/0.15ML IJ injection Inject 0.15 mg into the muscle as needed for anaphylaxis.    EPINEPHrine 0.3 mg/0.3 mL IJ SOAJ injection Inject 0.3 mg into the muscle as directed. Inject 0.3 mg into muscle as needed for anaphylaxis.   ferrous sulfate 325 (65 FE) MG tablet Take 325 mg by mouth daily.   gabapentin (NEURONTIN) 800 MG tablet Take 800 mg by mouth 3 (three) times daily.   hydrALAZINE (APRESOLINE) 50 MG tablet Take 50 mg by mouth 3 (three) times daily.   HYDROcodone-acetaminophen (NORCO/VICODIN) 5-325 MG tablet Take 1 tablet by mouth every 6 (six) hours as needed.   hydrOXYzine (ATARAX) 25 MG tablet Take 25 mg by mouth 3 (three) times daily as needed for anxiety.   insulin aspart (NOVOLOG) 100 UNIT/ML injection Inject into the skin. Inject into the skin daily as needed for High blood sugar.   insulin glargine, 2 Unit Dial, (TOUJEO MAX SOLOSTAR) 300 UNIT/ML Solostar Pen Inject 30 Units into the skin daily.   insulin lispro (HUMALOG) 100 UNIT/ML injection Inject 30 Units into the skin 3 (three) times daily before meals.   Magnesium 500 MG CAPS Take 1,000 mg by mouth daily.   Melatonin 10 MG TABS Take 20 mg by mouth daily.   montelukast (SINGULAIR) 10 MG tablet Take 10 mg by mouth daily.   nitroGLYCERIN (NITROSTAT) 0.4 MG SL tablet Place 1 tablet (0.4 mg total) under the tongue as needed. Every 5 minutes as needed for chest pain   ondansetron (ZOFRAN) 4 MG tablet Take 4 mg  by mouth every 8 (eight) hours as needed.   oxyCODONE (OXY IR/ROXICODONE) 5 MG immediate release tablet Take 5 mg by mouth every 6 (six) hours.   pantoprazole (PROTONIX) 40 MG tablet Take 40 mg by mouth 2 (two) times daily.   pravastatin (PRAVACHOL) 40 MG tablet Take 40 mg by mouth daily.   promethazine (PHENERGAN) 25 MG tablet Take 0.5-1 tab PO Q6HR PRN Nausea, Vomting   QUEtiapine (SEROQUEL) 300 MG tablet Take by mouth.   Semaglutide,0.25 or 0.'5MG'$ /DOS, (OZEMPIC, 0.25 OR 0.5 MG/DOSE,) 2 MG/1.5ML SOPN Inject 0.5 mg into the skin once a week.   sertraline (ZOLOFT) 100 MG tablet Take 100 mg by mouth daily.   spironolactone (ALDACTONE) 25 MG tablet Take 25 mg by mouth daily.   telmisartan (MICARDIS) 20 MG tablet Take 20 mg by mouth daily.   TRELEGY ELLIPTA 100-62.5-25 MCG/INH AEPB SMARTSIG:1 Inhalation Via Inhaler Daily   [DISCONTINUED] furosemide (LASIX) 20 MG tablet  Take 1 tablet (20 mg total) by mouth daily.   [DISCONTINUED] furosemide (LASIX) 40 MG tablet Take 1 tablet (40 mg total) by mouth daily.     Allergies:   Ciprofloxacin, Penicillin v, Penicillins, Sulfa antibiotics, and Sulfamethoxazole-trimethoprim   Social History   Socioeconomic History   Marital status: Married    Spouse name: Not on file   Number of children: 2   Years of education: Not on file   Highest education level: Not on file  Occupational History   Occupation: unemployed  Tobacco Use   Smoking status: Never   Smokeless tobacco: Never  Substance and Sexual Activity   Alcohol use: No   Drug use: No   Sexual activity: Not on file  Other Topics Concern   Not on file  Social History Narrative   Not on file   Social Determinants of Health   Financial Resource Strain: Not on file  Food Insecurity: Not on file  Transportation Needs: Not on file  Physical Activity: Not on file  Stress: Not on file  Social Connections: Not on file     Family History: The patient's family history includes Asthma in her  brother; Breast cancer in her paternal grandmother; Diabetes in her father and mother; Heart disease in her maternal grandmother and mother; Hypertension in her mother; Stroke in her mother.  ROS:   Review of Systems  Constitution: Negative for decreased appetite, fever and weight gain.  HENT: Negative for congestion, ear discharge, hoarse voice and sore throat.   Eyes: Negative for discharge, redness, vision loss in right eye and visual halos.  Cardiovascular: Negative for chest pain, dyspnea on exertion, leg swelling, orthopnea and palpitations.  Respiratory: Negative for cough, hemoptysis, shortness of breath and snoring.   Endocrine: Negative for heat intolerance and polyphagia.  Hematologic/Lymphatic: Negative for bleeding problem. Does not bruise/bleed easily.  Skin: Negative for flushing, nail changes, rash and suspicious lesions.  Musculoskeletal: Negative for arthritis, joint pain, muscle cramps, myalgias, neck pain and stiffness.  Gastrointestinal: Negative for abdominal pain, bowel incontinence, diarrhea and excessive appetite.  Genitourinary: Negative for decreased libido, genital sores and incomplete emptying.  Neurological: Negative for brief paralysis, focal weakness, headaches and loss of balance.  Psychiatric/Behavioral: Negative for altered mental status, depression and suicidal ideas.  Allergic/Immunologic: Negative for HIV exposure and persistent infections.    EKGs/Labs/Other Studies Reviewed:    The following studies were reviewed today:   EKG: None today  Zio monitor The patient wore the monitor for 10 days 22 hours starting September 19, 2019. Indication: Syncope The minimum heart rate was 72 bpm, maximum heart rate was 112 bpm, and average heart rate was 86 bpm. Predominant underlying rhythm was Sinus Rhythm.   Premature atrial complexes were rare less than 1%.   Premature Ventricular complexes were rare less than 1%.   No ventricular tachycardia, no pauses,  No AV block, no supraventricular tachycardia and no atrial fibrillation present. 12 patient triggered events noted all associated with sinus rhythm.   Conclusion: Unremarkable/normal study.  Lexiscan 2021 Nuclear stress EF: 66%. The left ventricular ejection fraction is hyperdynamic (>65%). There was no ST segment deviation noted during stress. This is a low risk study. Defect 1: There is a small defect of mild severity present in the mid anterior location. No eidence of ischemia or MI. Normal gated images with normal EF.   Recent Labs: 05/30/2021: Hemoglobin 13.5; Platelets 395 10/25/2021: BUN 14; Creatinine, Ser 0.80; Magnesium 1.7; Potassium 4.4; Sodium 138  Recent  Lipid Panel    Component Value Date/Time   CHOL 164 02/27/2017 0734   TRIG 293 (H) 02/27/2017 0734   HDL 41 02/27/2017 0734   CHOLHDL 4.0 02/27/2017 0734   VLDL 59 (H) 02/27/2017 0734   LDLCALC 64 02/27/2017 0734    Physical Exam:    VS:  BP 138/80   Pulse 90   Ht '5\' 6"'$  (1.676 m)   Wt 233 lb 12.8 oz (106.1 kg)   SpO2 94%   BMI 37.74 kg/m     Wt Readings from Last 3 Encounters:  10/25/21 233 lb 12.8 oz (106.1 kg)  05/30/21 224 lb 3.2 oz (101.7 kg)  06/29/20 233 lb (105.7 kg)     GEN: Well nourished, well developed in no acute distress HEENT: Normal NECK: No JVD; No carotid bruits LYMPHATICS: No lymphadenopathy CARDIAC: S1S2 noted,RRR, no murmurs, rubs, gallops RESPIRATORY:  Clear to auscultation without rales, wheezing or rhonchi  ABDOMEN: Soft, non-tender, non-distended, +bowel sounds, no guarding. EXTREMITIES: No edema, No cyanosis, no clubbing MUSCULOSKELETAL:  No deformity  SKIN: Warm and dry NEUROLOGIC:  Alert and oriented x 3, non-focal PSYCHIATRIC:  Normal affect, good insight  ASSESSMENT:    1. Primary hypertension   2. Supraventricular tachycardia   3. Diabetic peripheral neuropathy (HCC)   4. Obesity (BMI 30-39.9)   5. Medication management     PLAN:    Blood pressure is still  slightly elevated manually repeated by me.  In light of her increased leg swelling we will increase her Lasix to 40 mg daily.  Hopefully this will help as well to bring her blood pressure down. In the meantime we will continue her carvedilol 12.5 mg twice daily, Micardis 20 mg daily, spironolactone 25 mg daily.  The patient understands the need to lose weight with diet and exercise. We have discussed specific strategies for this.  Blood work will be done today for BMP and mag.  The patient is in agreement with the above plan. The patient left the office in stable condition.  The patient will follow up in   Medication Adjustments/Labs and Tests Ordered: Current medicines are reviewed at length with the patient today.  Concerns regarding medicines are outlined above.  Orders Placed This Encounter  Procedures   Basic Metabolic Panel (BMET)   Magnesium   Meds ordered this encounter  Medications   DISCONTD: furosemide (LASIX) 40 MG tablet    Sig: Take 1 tablet (40 mg total) by mouth daily.    Dispense:  90 tablet    Refill:  3   furosemide (LASIX) 40 MG tablet    Sig: Take 1 tablet (40 mg total) by mouth daily.    Dispense:  90 tablet    Refill:  3    Patient Instructions  Medication Instructions:  Your physician has recommended you make the following change in your medication:  INCREASE: Lasix 40 mg once daily *If you need a refill on your cardiac medications before your next appointment, please call your pharmacy*   Lab Work: TODAY: BMET, Mag If you have labs (blood work) drawn today and your tests are completely normal, you will receive your results only by: [place] (if you have MyChart) OR A paper copy in the mail If you have any lab test that is abnormal or we need to change your treatment, we will call you to review the results.   Testing/Procedures: None   Follow-Up: At [facility], you and your health needs are our priority.  As part of our  continuing mission to provide you with exceptional heart care, we have created designated Provider Care Teams.  These Care Teams include your primary Cardiologist (physician) and Advanced Practice Providers (APPs -  Physician Assistants and Nurse Practitioners) who all work together to provide you with the care you need, when you need it.  We recommend signing up for the patient portal called "MyChart".  Sign up information is provided on this After Visit Summary.  MyChart is used to connect with patients for Virtual Visits (Telemedicine).  Patients are able to view lab/test results, encounter notes, upcoming appointments, etc.  Non-urgent messages can be sent to your provider as well.   To learn more about what you can do with MyChart, go to [email or web address].    Your next appointment:   9 month(s)  The format for your next appointment:   In Person  Provider:   Berniece Salines, DO    Other Instructions   Important Information About Sugar         Adopting a Healthy Lifestyle.  Know what a healthy weight is for you (roughly BMI <25) and aim to maintain this   Aim for 7+ servings of fruits and vegetables daily   65-80+ fluid ounces of water or unsweet tea for healthy kidneys   Limit to max 1 drink of alcohol per day; avoid smoking/tobacco   Limit animal fats in diet for cholesterol and heart health - choose grass fed whenever available   Avoid highly processed foods, and foods high in saturated/trans fats   Aim for low stress - take time to unwind and care for your mental health   Aim for 150 min of moderate intensity exercise weekly for heart health, and weights twice weekly for bone health   Aim for 7-9 hours of sleep daily   When it comes to diets, agreement about the perfect plan isnt easy to find, even among the experts. Experts at the [employer] developed an idea known as the Healthy Eating Plate. Just imagine a plate divided into logical,  healthy portions.   The emphasis is on diet quality:   Load up on vegetables and fruits - one-half of your plate: Aim for color and variety, and remember that potatoes dont count.   Go for whole grains - one-quarter of your plate: Whole wheat, barley, wheat berries, quinoa, oats, brown rice, and foods made with them. If you want pasta, go with whole wheat pasta.   Protein power - one-quarter of your plate: Fish, chicken, beans, and nuts are all healthy, versatile protein sources. Limit red meat.   The diet, however, does go beyond the plate, offering a few other suggestions.   Use healthy plant oils, such as olive, canola, soy, corn, sunflower and peanut. Check the labels, and avoid partially hydrogenated oil, which have unhealthy trans fats.   If youre thirsty, drink water. Coffee and tea are good in moderation, but skip sugary drinks and limit milk and dairy products to one or two daily servings.   The type of carbohydrate in the diet is more important than the amount. Some sources of carbohydrates, such as vegetables, fruits, whole grains, and beans-are healthier than others.   Finally, stay active  Signed, Berniece Salines, DO  10/28/2021 7:04 PM    [facility]

## 2021-10-25 NOTE — Patient Instructions (Addendum)
Medication Instructions:  Your physician has recommended you make the following change in your medication:  INCREASE: Lasix 40 mg once daily *If you need a refill on your cardiac medications before your next appointment, please call your pharmacy*   Lab Work: TODAY: BMET, Mag If you have labs (blood work) drawn today and your tests are completely normal, you will receive your results only by: [place] (if you have MyChart) OR A paper copy in the mail If you have any lab test that is abnormal or we need to change your treatment, we will call you to review the results.   Testing/Procedures: None   Follow-Up: At [facility], you and your health needs are our priority.  As part of our continuing mission to provide you with exceptional heart care, we have created designated Provider Care Teams.  These Care Teams include your primary Cardiologist (physician) and Advanced Practice Providers (APPs -  Physician Assistants and Nurse Practitioners) who all work together to provide you with the care you need, when you need it.  We recommend signing up for the patient portal called "MyChart".  Sign up information is provided on this After Visit Summary.  MyChart is used to connect with patients for Virtual Visits (Telemedicine).  Patients are able to view lab/test results, encounter notes, upcoming appointments, etc.  Non-urgent messages can be sent to your provider as well.   To learn more about what you can do with MyChart, go to [email or web address].    Your next appointment:   9 month(s)  The format for your next appointment:   In Person  Provider:   Berniece Salines, DO    Other Instructions   Important Information About Sugar

## 2021-10-26 LAB — BASIC METABOLIC PANEL
BUN/Creatinine Ratio: 18 (ref 9–23)
BUN: 14 mg/dL (ref 6–24)
CO2: 20 mmol/L (ref 20–29)
Calcium: 9 mg/dL (ref 8.7–10.2)
Chloride: 95 mmol/L — ABNORMAL LOW (ref 96–106)
Creatinine, Ser: 0.8 mg/dL (ref 0.57–1.00)
Glucose: 288 mg/dL — ABNORMAL HIGH (ref 70–99)
Potassium: 4.4 mmol/L (ref 3.5–5.2)
Sodium: 138 mmol/L (ref 134–144)
eGFR: 91 mL/min/{1.73_m2} (ref >59)

## 2021-10-26 LAB — MAGNESIUM: Magnesium: 1.7 mg/dL (ref 1.6–2.3)

## 2022-04-18 ENCOUNTER — Other Ambulatory Visit: Payer: Self-pay | Admitting: *Deleted

## 2022-04-18 ENCOUNTER — Other Ambulatory Visit: Payer: Self-pay | Admitting: Cardiology

## 2022-04-18 MED ORDER — CARVEDILOL 12.5 MG PO TABS: 12.5000 mg | ORAL_TABLET | Freq: Two times a day (BID) | ORAL | 1 refills | Status: DC

## 2022-10-09 DIAGNOSIS — R918 Other nonspecific abnormal finding of lung field: Secondary | ICD-10-CM | POA: Diagnosis not present

## 2022-10-10 ENCOUNTER — Other Ambulatory Visit: Payer: Self-pay | Admitting: Cardiology

## 2022-10-10 ENCOUNTER — Other Ambulatory Visit: Payer: Self-pay | Admitting: *Deleted

## 2022-10-10 MED ORDER — CARVEDILOL 12.5 MG PO TABS: 12.5000 mg | ORAL_TABLET | Freq: Two times a day (BID) | ORAL | 0 refills | Status: DC

## 2022-10-10 MED ORDER — FUROSEMIDE 40 MG PO TABS: 40.0000 mg | ORAL_TABLET | Freq: Every day | ORAL | 0 refills | Status: DC

## 2022-10-16 ENCOUNTER — Encounter: Payer: Self-pay | Admitting: Specialist

## 2022-11-09 ENCOUNTER — Other Ambulatory Visit: Payer: Self-pay

## 2022-11-09 MED ORDER — FUROSEMIDE 40 MG PO TABS: 40.0000 mg | ORAL_TABLET | Freq: Every day | ORAL | 0 refills | Status: AC

## 2022-11-11 ENCOUNTER — Other Ambulatory Visit: Payer: Self-pay | Admitting: Cardiology

## 2022-11-13 ENCOUNTER — Other Ambulatory Visit: Payer: Self-pay

## 2022-11-13 MED ORDER — CARVEDILOL 12.5 MG PO TABS: 12.5000 mg | ORAL_TABLET | Freq: Two times a day (BID) | ORAL | 0 refills | Status: AC
